# Patient Record
Sex: Female | Born: 1950 | Race: White | Hispanic: No | Smoking: Former smoker
Health system: Southern US, Community
[De-identification: ages and names within clinical notes are randomized; demographics above are authoritative.]

## PROBLEM LIST (undated history)

## (undated) ENCOUNTER — Ambulatory Visit: Admission: EM | Source: Home / Self Care

## (undated) DIAGNOSIS — B019 Varicella without complication: Secondary | ICD-10-CM

## (undated) DIAGNOSIS — N179 Acute kidney failure, unspecified: Secondary | ICD-10-CM

## (undated) DIAGNOSIS — E119 Type 2 diabetes mellitus without complications: Secondary | ICD-10-CM

## (undated) DIAGNOSIS — Z803 Family history of malignant neoplasm of breast: Secondary | ICD-10-CM

## (undated) DIAGNOSIS — Z801 Family history of malignant neoplasm of trachea, bronchus and lung: Secondary | ICD-10-CM

## (undated) DIAGNOSIS — F329 Major depressive disorder, single episode, unspecified: Secondary | ICD-10-CM

## (undated) DIAGNOSIS — Z800 Family history of malignant neoplasm of digestive organs: Secondary | ICD-10-CM

## (undated) DIAGNOSIS — Z87898 Personal history of other specified conditions: Secondary | ICD-10-CM

## (undated) DIAGNOSIS — T7840XA Allergy, unspecified, initial encounter: Secondary | ICD-10-CM

## (undated) DIAGNOSIS — R7303 Prediabetes: Secondary | ICD-10-CM

## (undated) DIAGNOSIS — N390 Urinary tract infection, site not specified: Secondary | ICD-10-CM

## (undated) DIAGNOSIS — F32A Depression, unspecified: Secondary | ICD-10-CM

## (undated) DIAGNOSIS — N3010 Interstitial cystitis (chronic) without hematuria: Secondary | ICD-10-CM

## (undated) DIAGNOSIS — M4800 Spinal stenosis, site unspecified: Secondary | ICD-10-CM

## (undated) DIAGNOSIS — I10 Essential (primary) hypertension: Secondary | ICD-10-CM

## (undated) HISTORY — DX: Urinary tract infection, site not specified: N39.0

## (undated) HISTORY — DX: Personal history of other specified conditions: Z87.898

## (undated) HISTORY — DX: Family history of malignant neoplasm of digestive organs: Z80.0

## (undated) HISTORY — DX: Family history of malignant neoplasm of trachea, bronchus and lung: Z80.1

## (undated) HISTORY — DX: Spinal stenosis, site unspecified: M48.00

## (undated) HISTORY — DX: Allergy, unspecified, initial encounter: T78.40XA

## (undated) HISTORY — DX: Varicella without complication: B01.9

## (undated) HISTORY — DX: Major depressive disorder, single episode, unspecified: F32.9

## (undated) HISTORY — DX: Depression, unspecified: F32.A

## (undated) HISTORY — DX: Acute kidney failure, unspecified: N17.9

## (undated) HISTORY — PX: CATARACT EXTRACTION: SUR2

## (undated) HISTORY — DX: Family history of malignant neoplasm of breast: Z80.3

## (undated) HISTORY — PX: OTHER SURGICAL HISTORY: SHX169

## (undated) HISTORY — DX: Prediabetes: R73.03

## (undated) HISTORY — DX: Interstitial cystitis (chronic) without hematuria: N30.10

## (undated) HISTORY — DX: Essential (primary) hypertension: I10

---

## 2012-03-27 ENCOUNTER — Ambulatory Visit: Payer: Self-pay | Admitting: Family Medicine

## 2012-03-27 IMAGING — CR DG CHEST 2V
1 series · 2 of 2 positions shown · non-contrast
Comparison: none

REASON FOR EXAM: cough and dyspnea
COMMENTS:

[Series 1: w chest pa · 0.14mm/px · 2 of 2 slices shown]
[im 1/2]
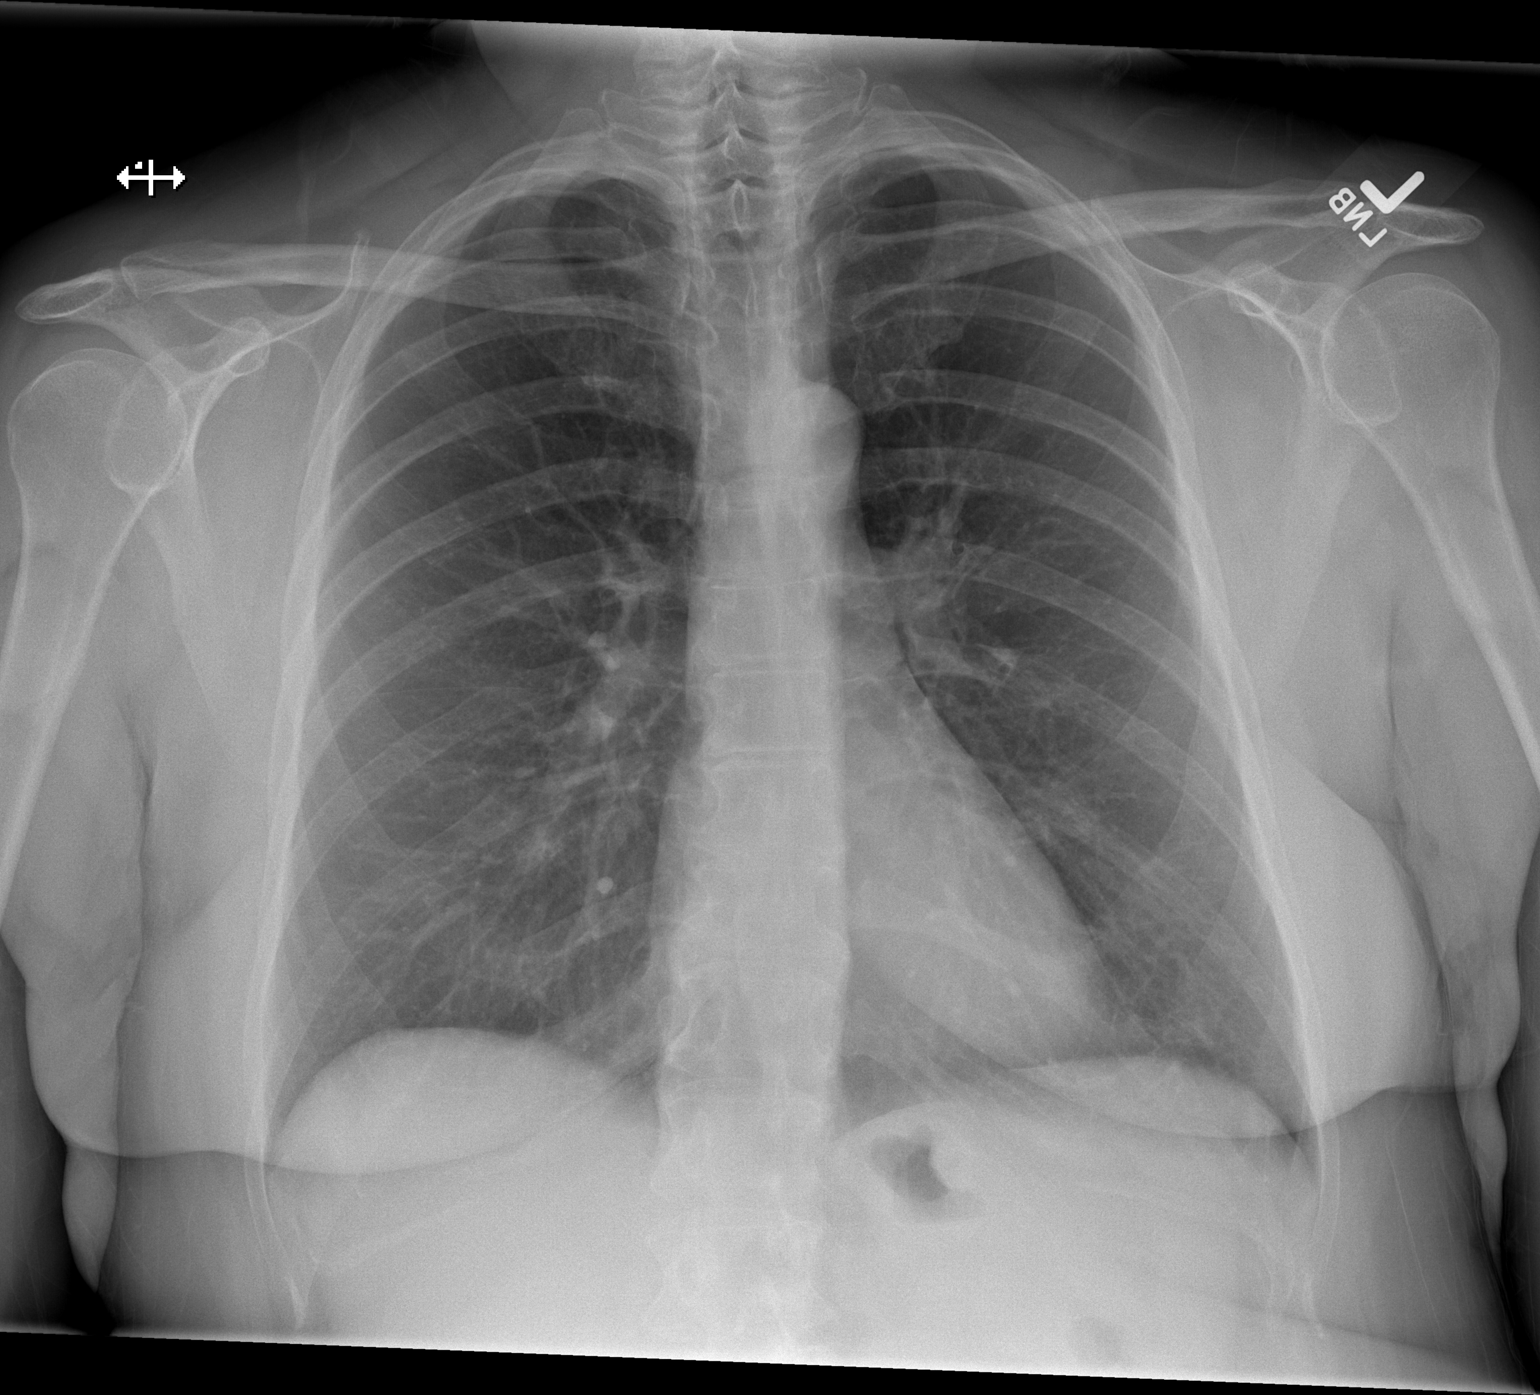
[im 2/2]
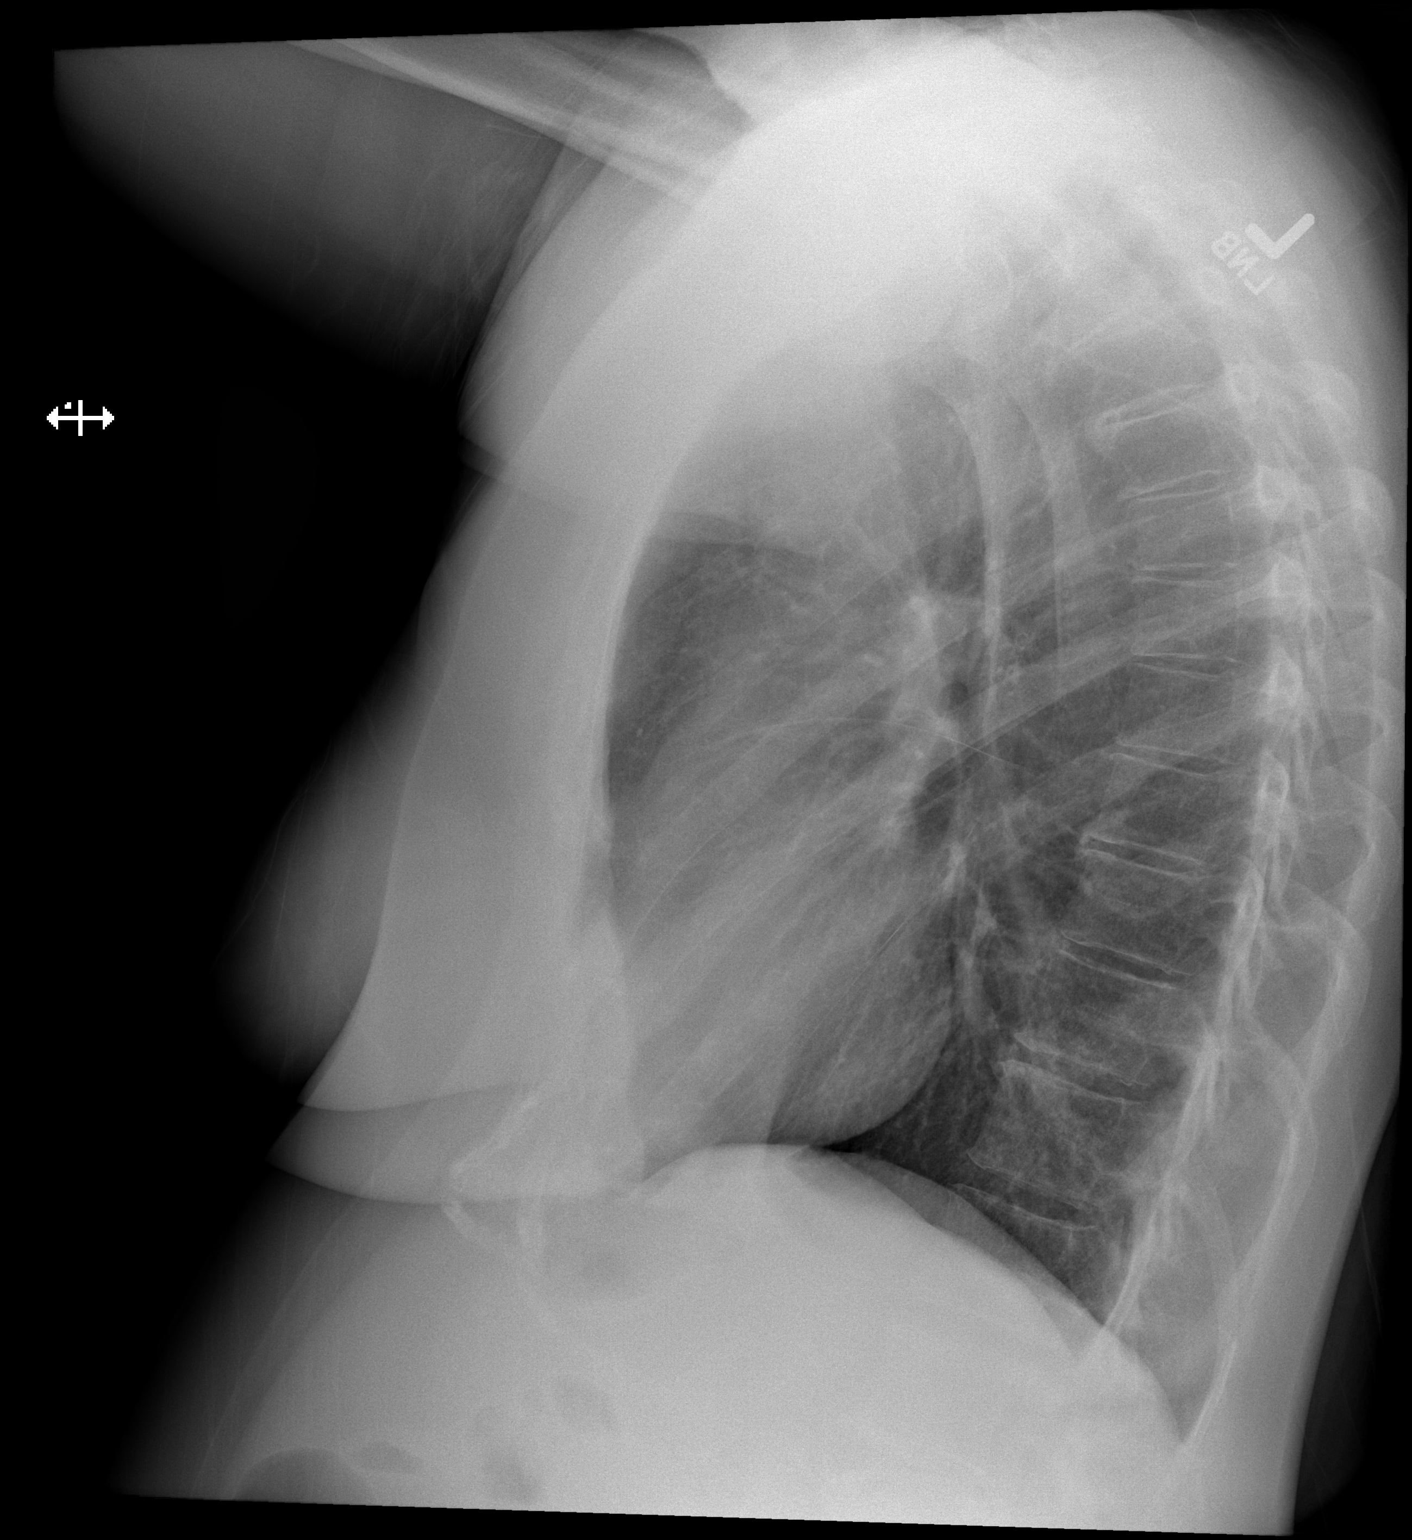

[2 of 2 positions shown; findings below may reference images not displayed]

PROCEDURE:     DXR - DXR CHEST PA (OR AP) AND LATERAL  - [DATE] [DATE]

RESULT:     The lungs are clear. The heart and pulmonary vessels are normal.
The bony and mediastinal structures are unremarkable. There is no effusion.
There is no pneumothorax or evidence of congestive failure. There is mild
hyperinflation suggestive of COPD. Correlate clinically.
IMPRESSION: No acute cardiopulmonary disease. Hyperinflation present.
Correlate for COPD.

[REDACTED]

## 2013-12-17 ENCOUNTER — Ambulatory Visit: Payer: Self-pay | Admitting: Nurse Practitioner

## 2013-12-17 IMAGING — MG MM DIGITAL SCREENING BILAT W/ TOMO W/ CAD
8 of 14 series · 8 of 30 positions shown · non-contrast
Comparison: None.

CLINICAL DATA: Screening.

EXAM:
DIGITAL SCREENING BILATERAL MAMMOGRAM WITH 3D TOMO WITH CAD

[R MLO (1 of 2)]
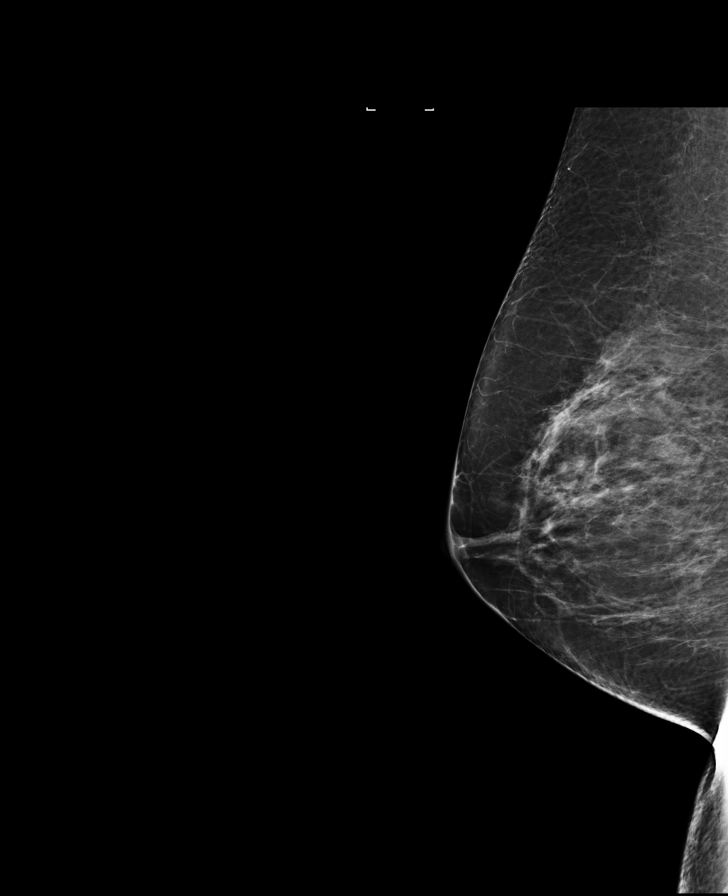

[L CC (1 of 2)]
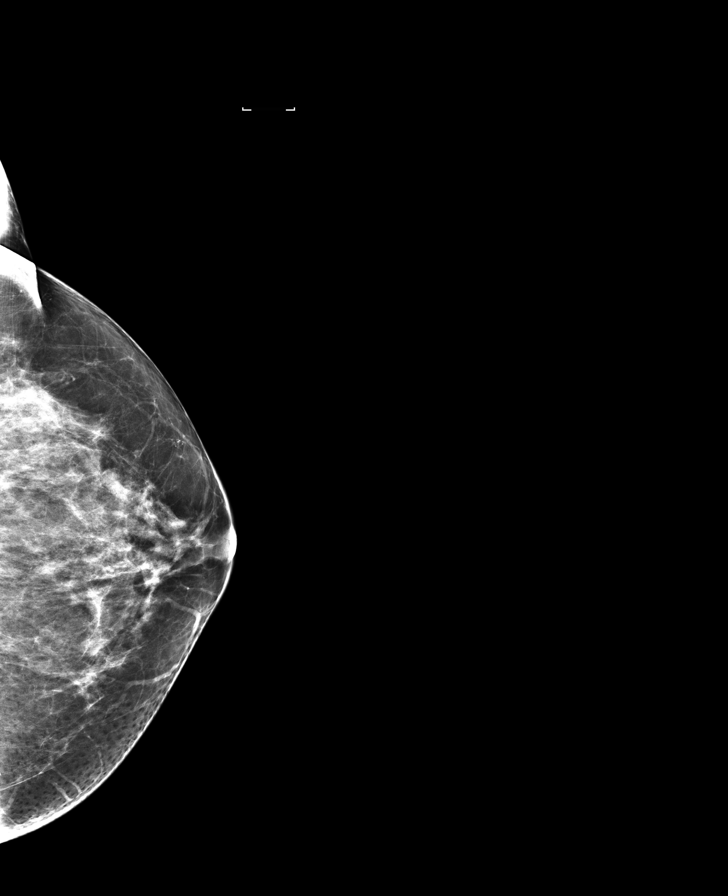

[L CC synth-2D]
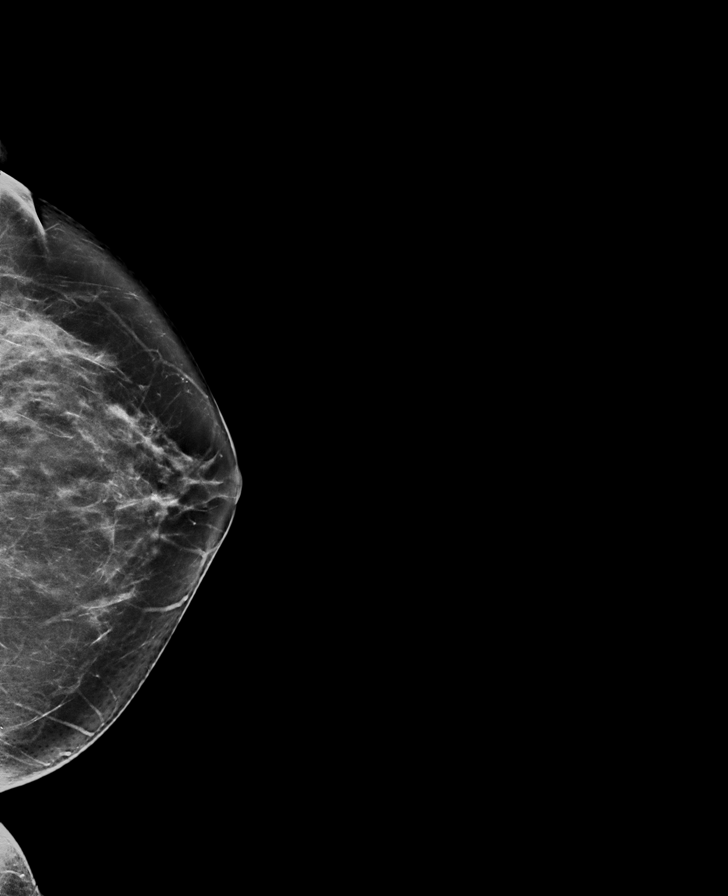

[L CC (2 of 2)]
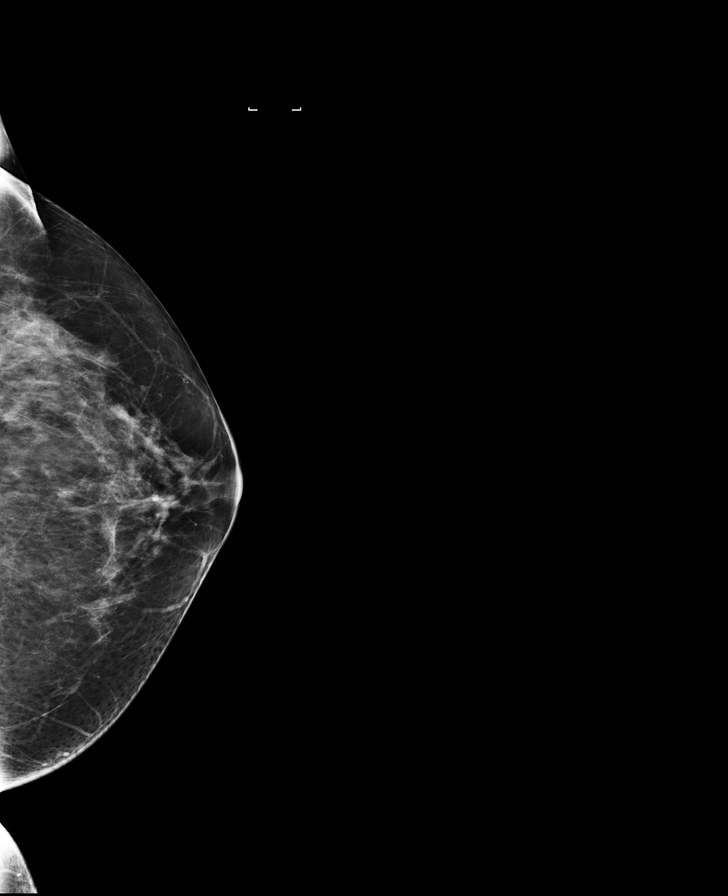

[R CC synth-2D]
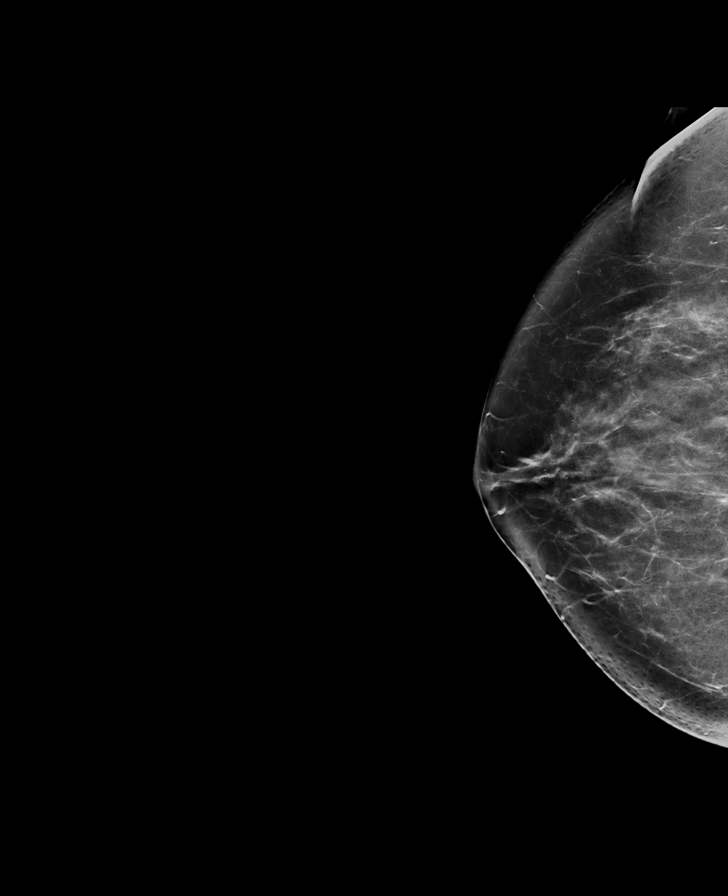

[R MLO synth-2D]
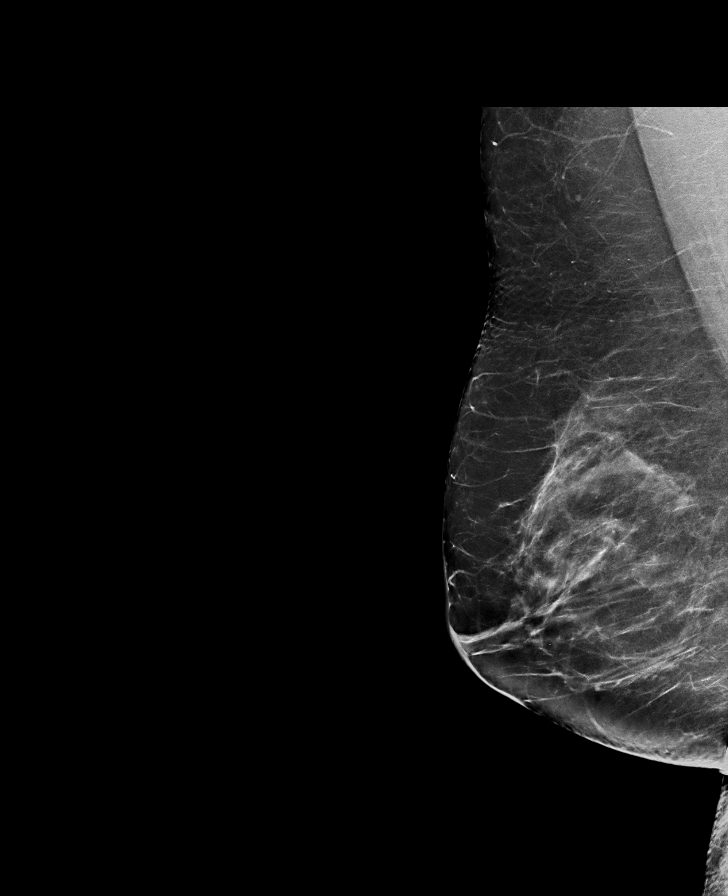

[R CC]
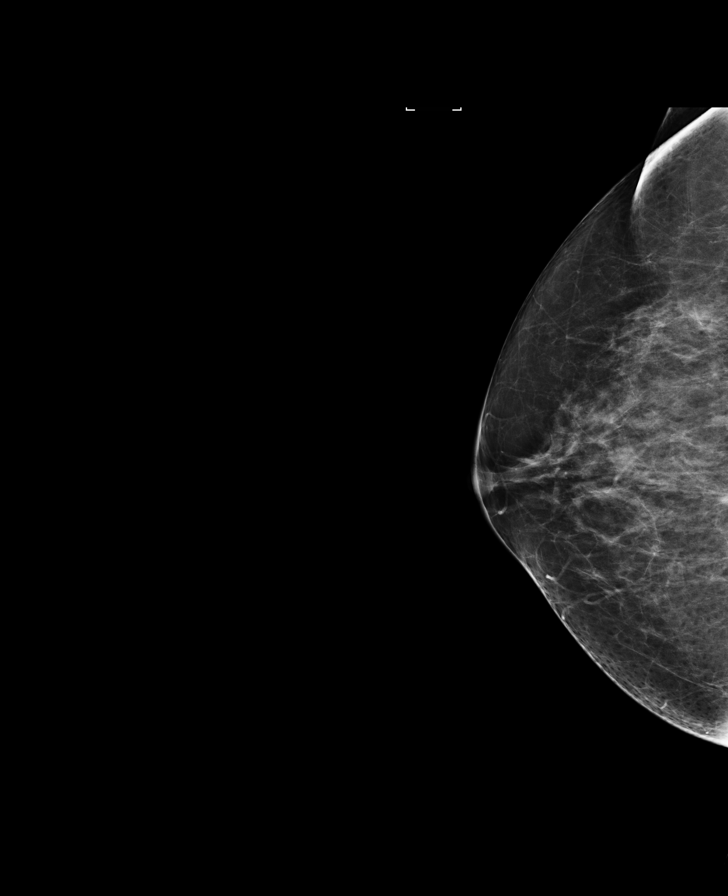

[R MLO (2 of 2)]
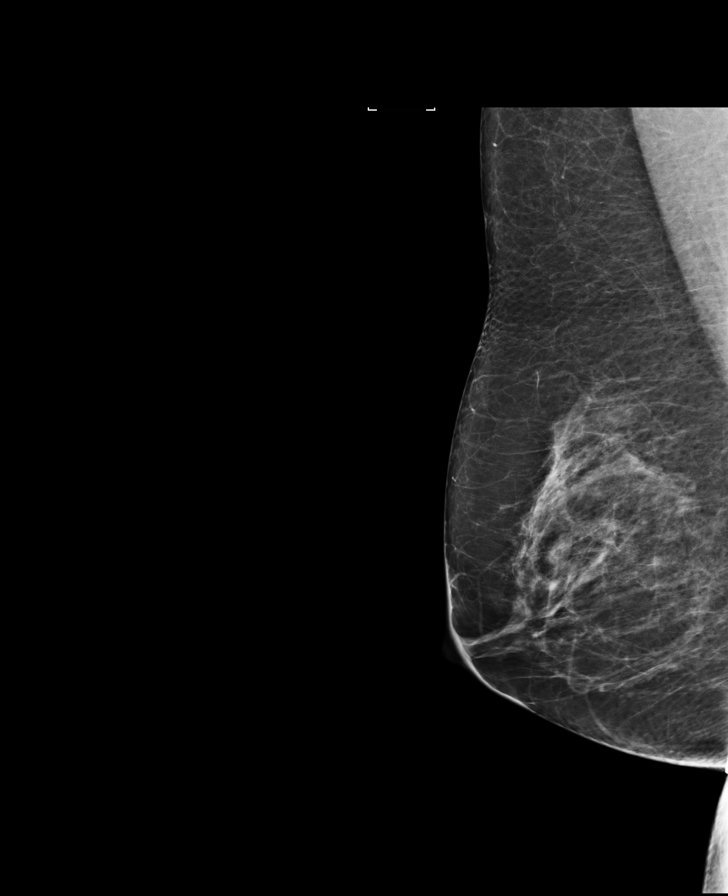

[8 of 30 positions shown; findings below may reference images not displayed]

ACR Breast Density Category b: There are scattered areas of
fibroglandular density.
FINDINGS: There are no findings suspicious for malignancy. Images were
processed with CAD.
IMPRESSION: No mammographic evidence of malignancy. A result letter of this
screening mammogram will be mailed directly to the patient.

RECOMMENDATION:
Screening mammogram in one year. (Code:[N7])

BI-RADS CATEGORY  1: Negative.

## 2015-02-20 DIAGNOSIS — M47812 Spondylosis without myelopathy or radiculopathy, cervical region: Secondary | ICD-10-CM | POA: Insufficient documentation

## 2015-05-20 ENCOUNTER — Other Ambulatory Visit: Payer: Self-pay | Admitting: Internal Medicine

## 2015-05-20 DIAGNOSIS — Z1239 Encounter for other screening for malignant neoplasm of breast: Secondary | ICD-10-CM

## 2015-05-20 DIAGNOSIS — M150 Primary generalized (osteo)arthritis: Secondary | ICD-10-CM | POA: Insufficient documentation

## 2015-06-08 ENCOUNTER — Other Ambulatory Visit: Payer: Self-pay | Admitting: Internal Medicine

## 2015-06-08 ENCOUNTER — Ambulatory Visit
Admission: RE | Admit: 2015-06-08 | Discharge: 2015-06-08 | Disposition: A | Discharge status: Home / Self Care | Payer: Medicare Other | Source: Ambulatory Visit | Attending: Internal Medicine | Admitting: Internal Medicine

## 2015-06-08 DIAGNOSIS — Z1239 Encounter for other screening for malignant neoplasm of breast: Secondary | ICD-10-CM

## 2015-06-08 DIAGNOSIS — Z1231 Encounter for screening mammogram for malignant neoplasm of breast: Secondary | ICD-10-CM | POA: Insufficient documentation

## 2015-06-08 IMAGING — MG MM DIGITAL SCREENING BILAT W/ TOMO W/ CAD
8 of 12 series · 8 of 28 positions shown · non-contrast
Comparison: Previous exam(s).

CLINICAL DATA: Screening.

EXAM:
2D DIGITAL SCREENING BILATERAL MAMMOGRAM WITH CAD AND ADJUNCT TOMO

[L MLO synth-2D]
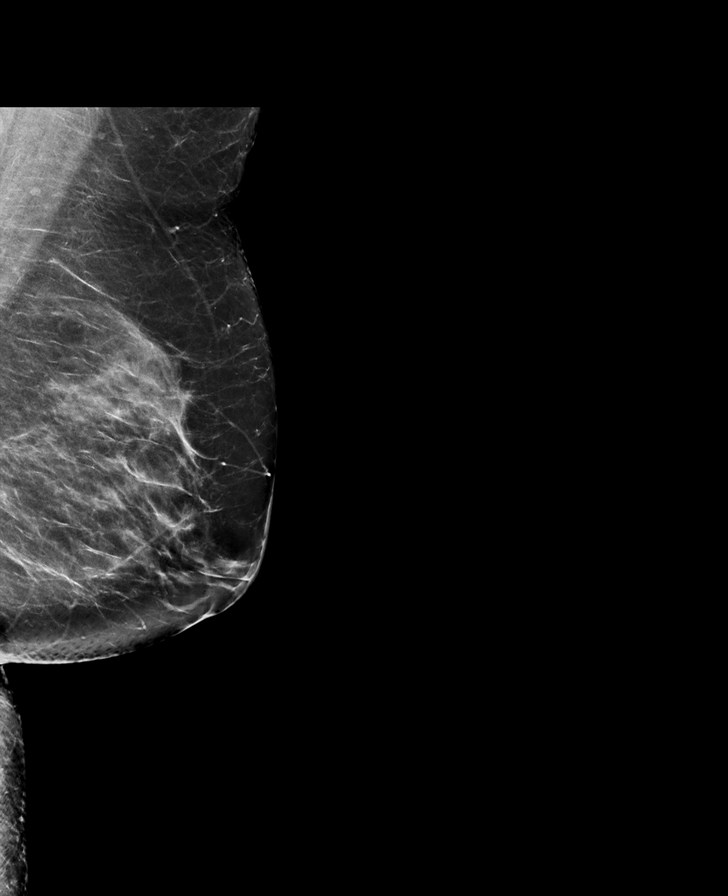

[R CC]
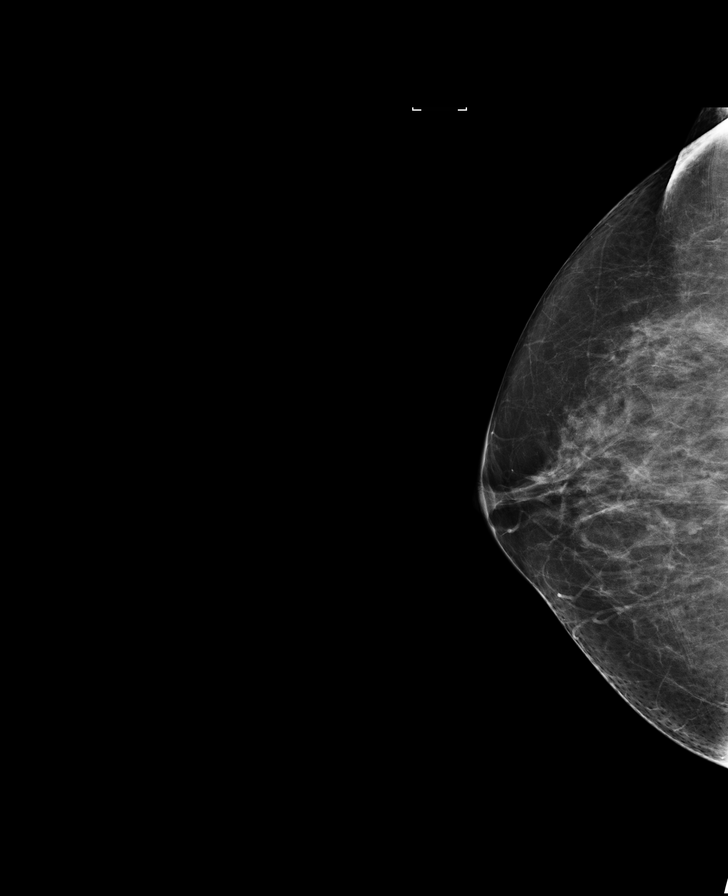

[L MLO]
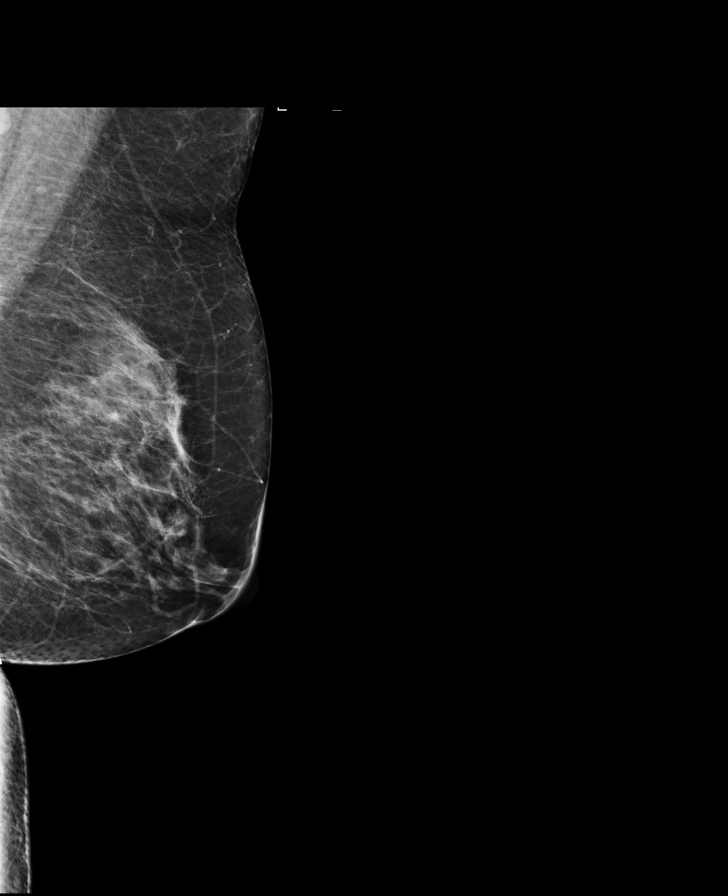

[L CC]
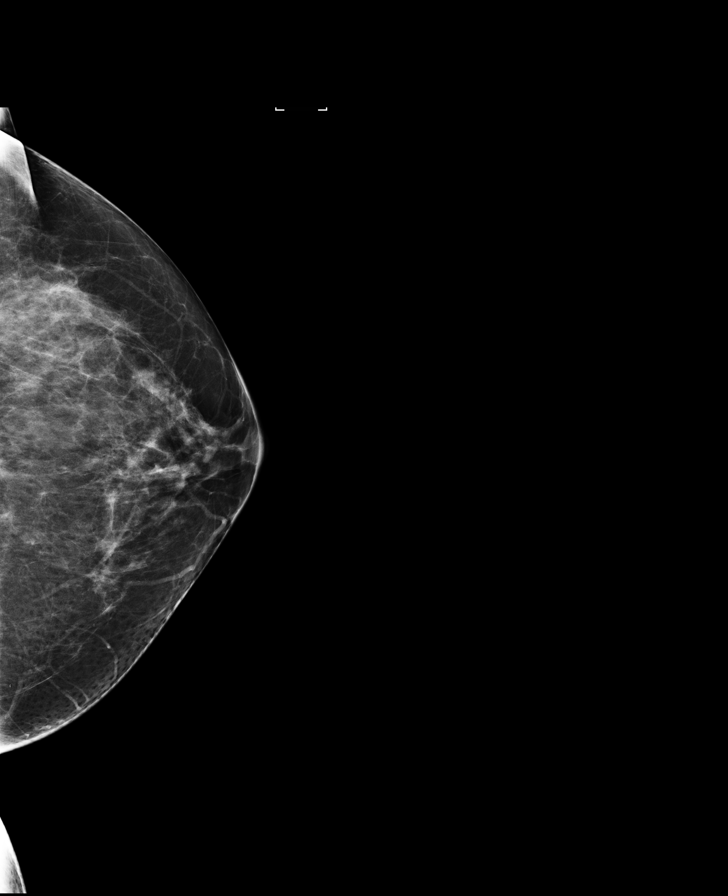

[R CC synth-2D]
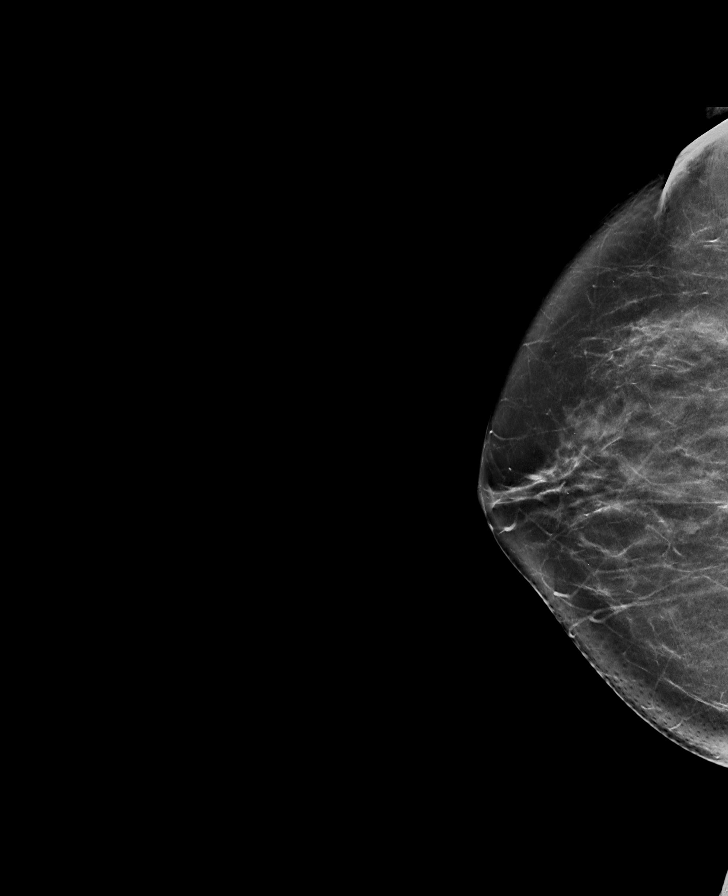

[R MLO synth-2D]
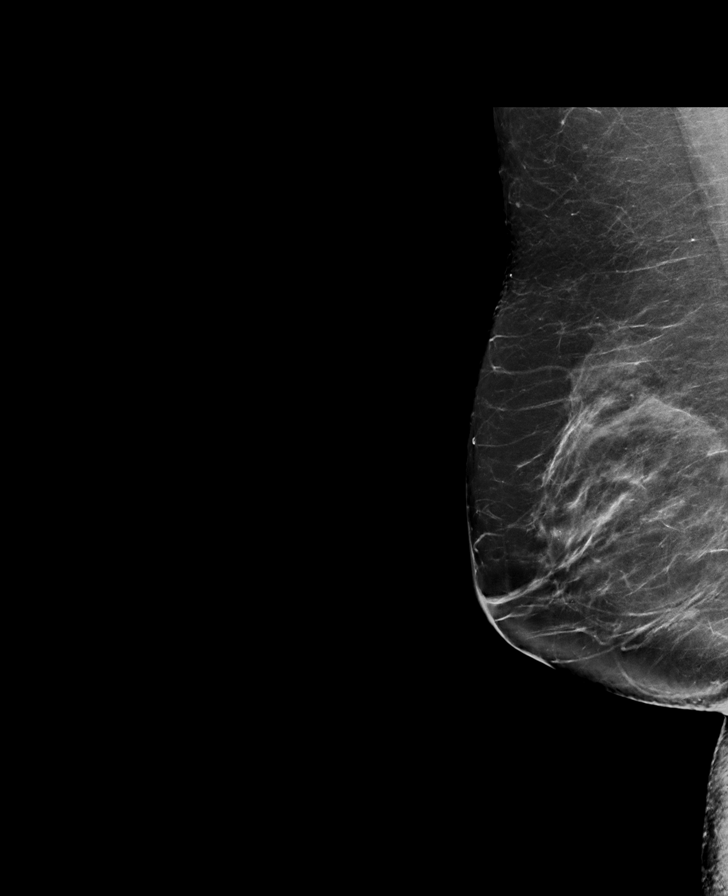

[R MLO]
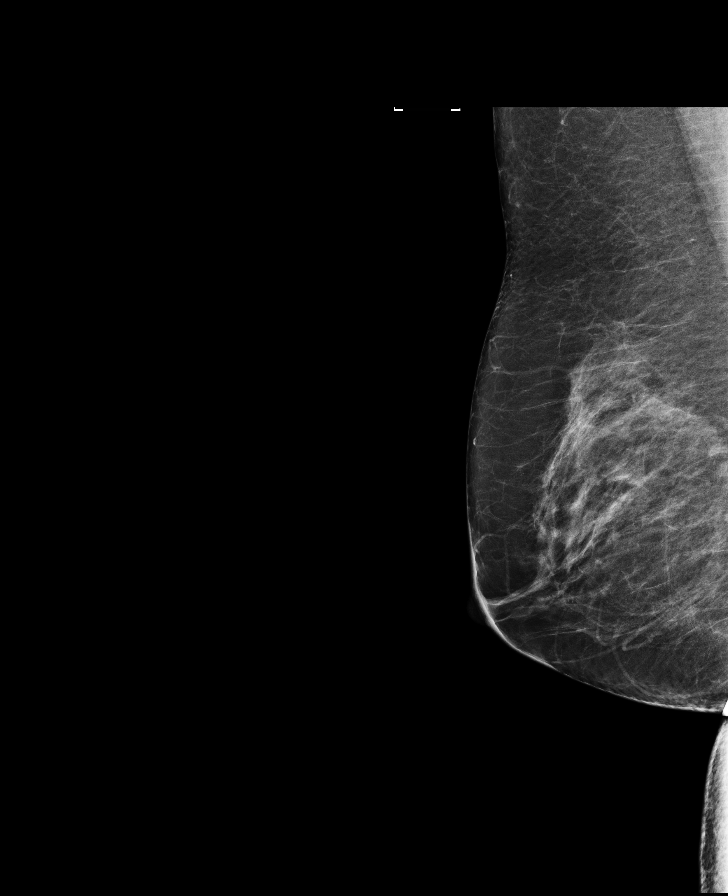

[L CC synth-2D]
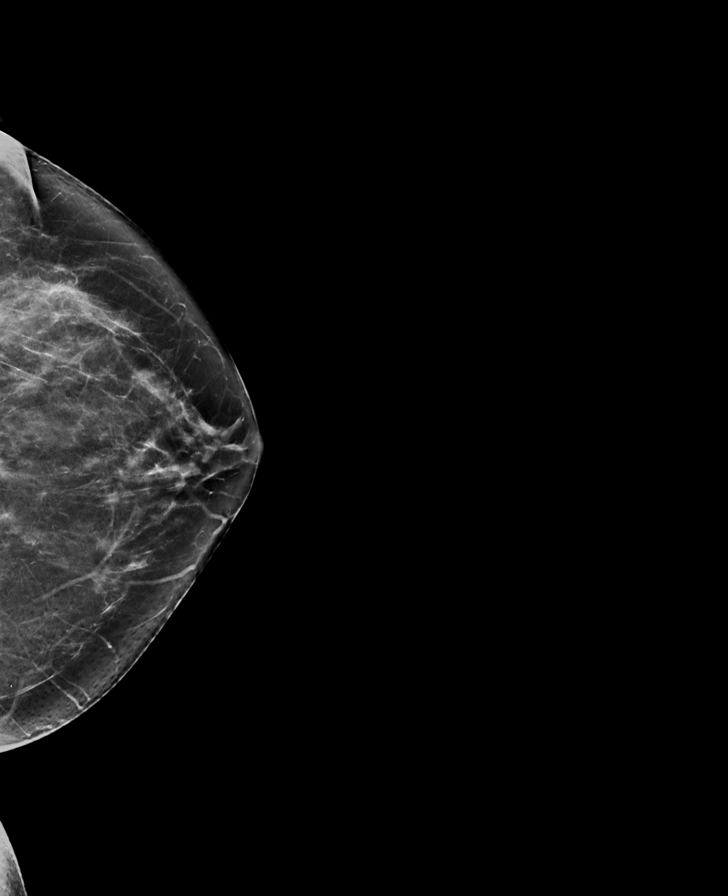

[8 of 28 positions shown; findings below may reference images not displayed]

ACR Breast Density Category c: The breast tissue is heterogeneously
dense, which may obscure small masses.
FINDINGS: There are no findings suspicious for malignancy. Images were
processed with CAD.
IMPRESSION: No mammographic evidence of malignancy. A result letter of this
screening mammogram will be mailed directly to the patient.

RECOMMENDATION:
Screening mammogram in one year. (Code:[TA])

BI-RADS CATEGORY  1: Negative.

## 2016-06-20 ENCOUNTER — Other Ambulatory Visit: Payer: Self-pay | Admitting: Internal Medicine

## 2016-06-20 DIAGNOSIS — Z1231 Encounter for screening mammogram for malignant neoplasm of breast: Secondary | ICD-10-CM

## 2016-07-04 ENCOUNTER — Ambulatory Visit
Admission: RE | Admit: 2016-07-04 | Discharge: 2016-07-04 | Disposition: A | Discharge status: Home / Self Care | Payer: Medicare Other | Source: Ambulatory Visit | Attending: Internal Medicine | Admitting: Internal Medicine

## 2016-07-04 DIAGNOSIS — Z1231 Encounter for screening mammogram for malignant neoplasm of breast: Secondary | ICD-10-CM | POA: Diagnosis present

## 2016-07-04 IMAGING — MG MM DIGITAL SCREENING BILAT W/ TOMO W/ CAD
8 of 14 series · 8 of 30 positions shown · non-contrast
Comparison: Previous exam(s).

CLINICAL DATA: Screening.

EXAM:
2D DIGITAL SCREENING BILATERAL MAMMOGRAM WITH CAD AND ADJUNCT TOMO

[R MLO (1 of 2)]
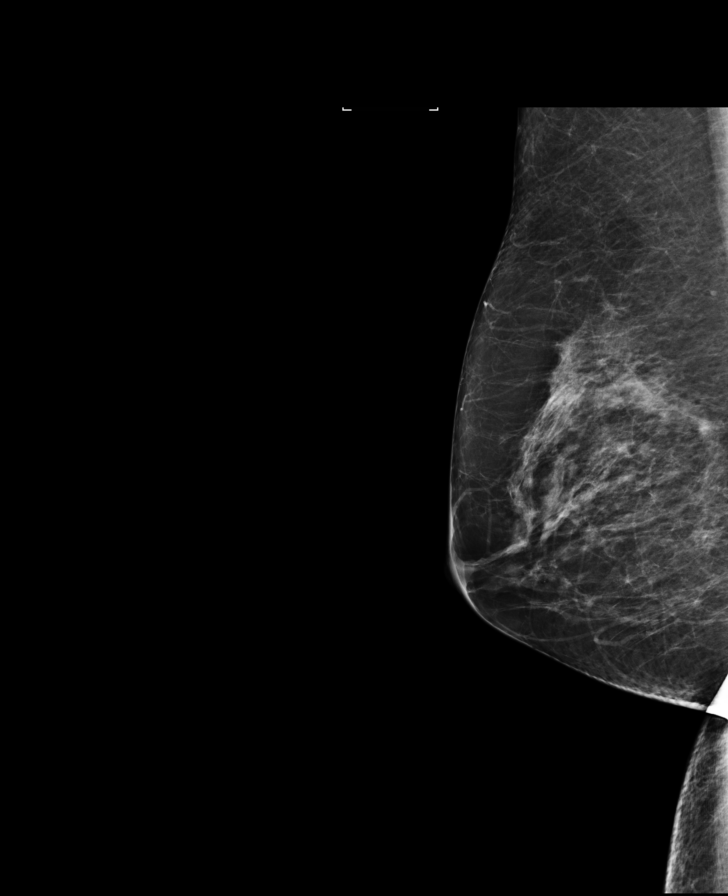

[L MLO (1 of 2)]
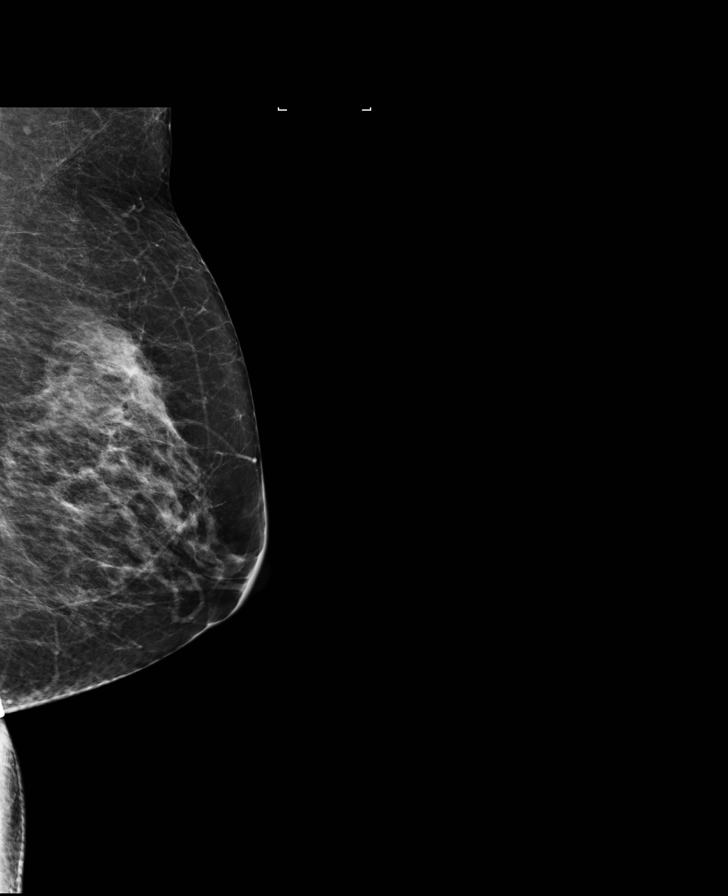

[R MLO synth-2D]
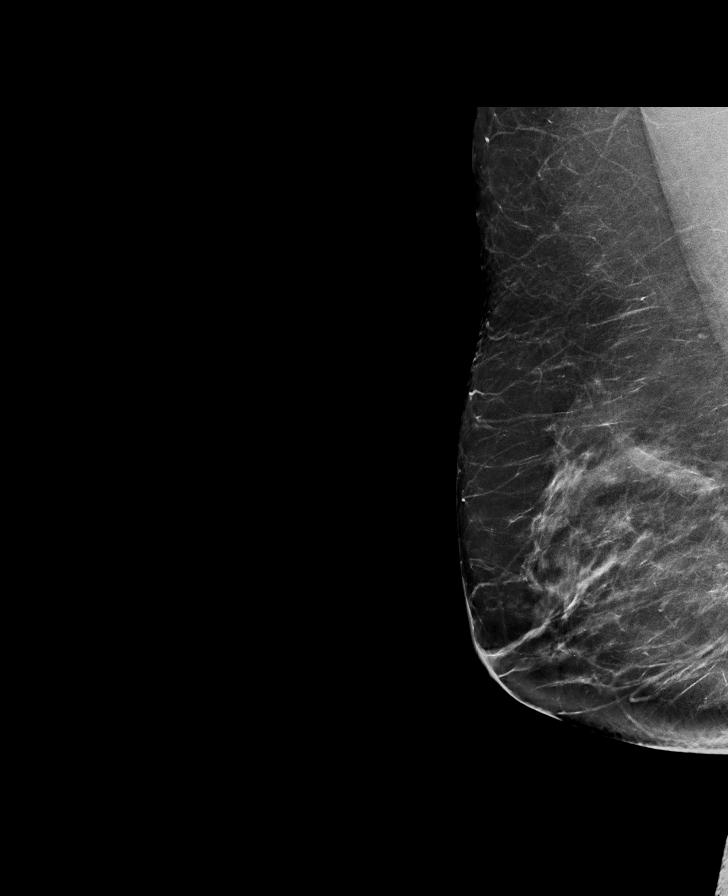

[L CC]
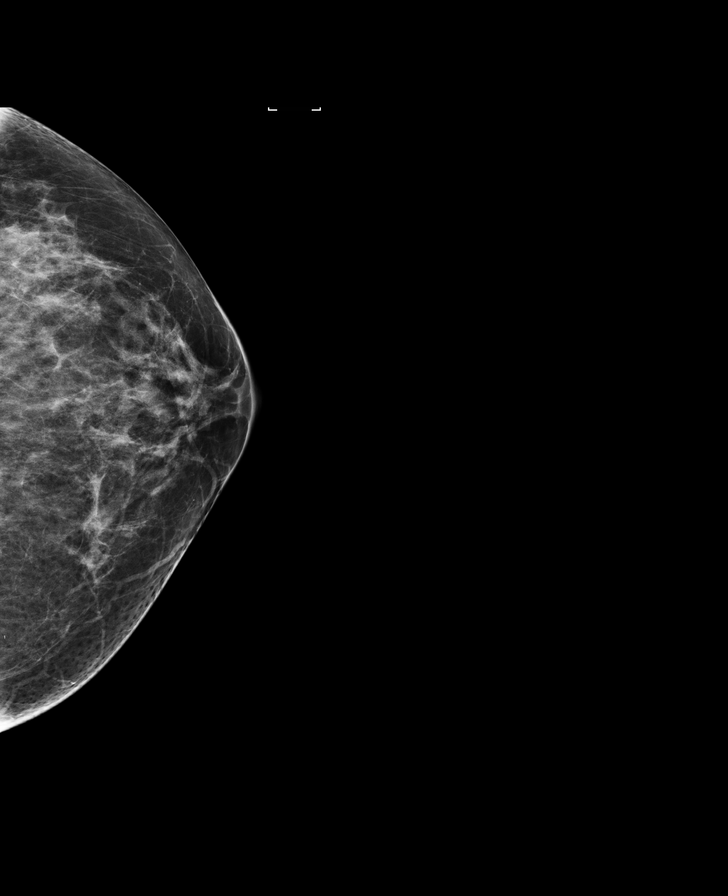

[R MLO (2 of 2)]
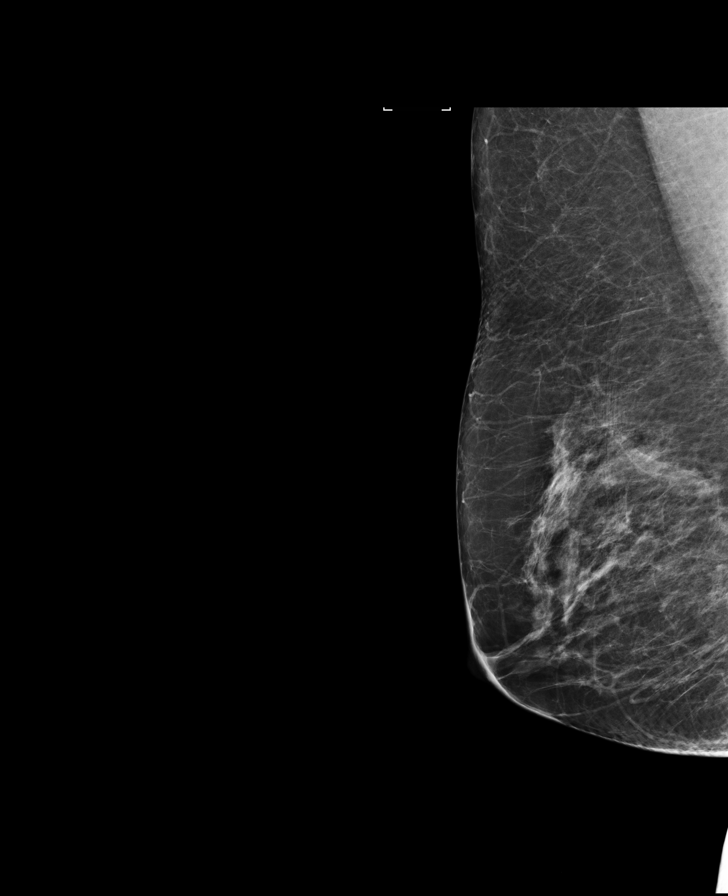

[L MLO synth-2D]
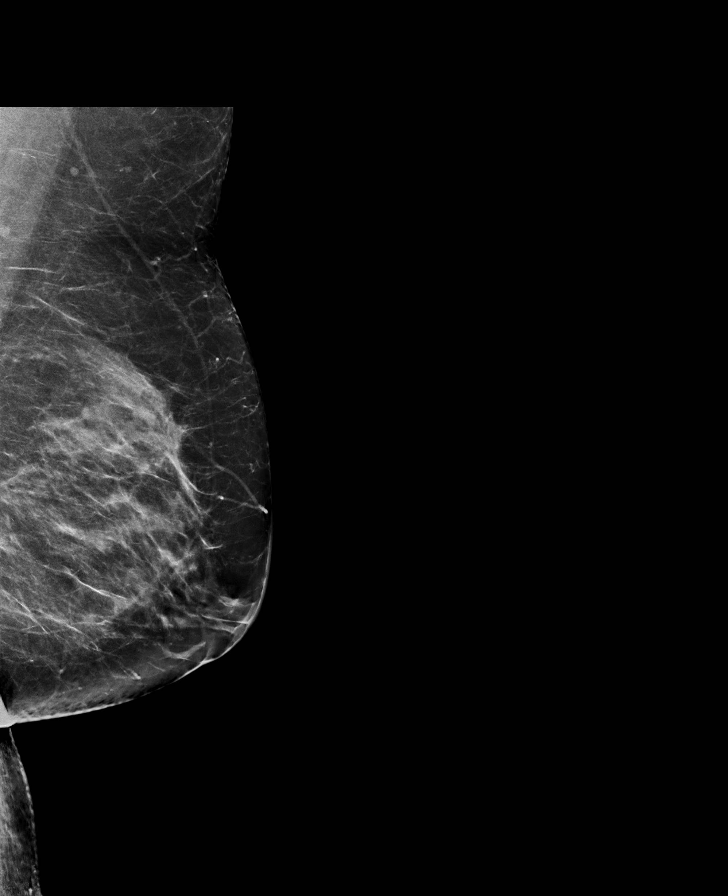

[R CC]
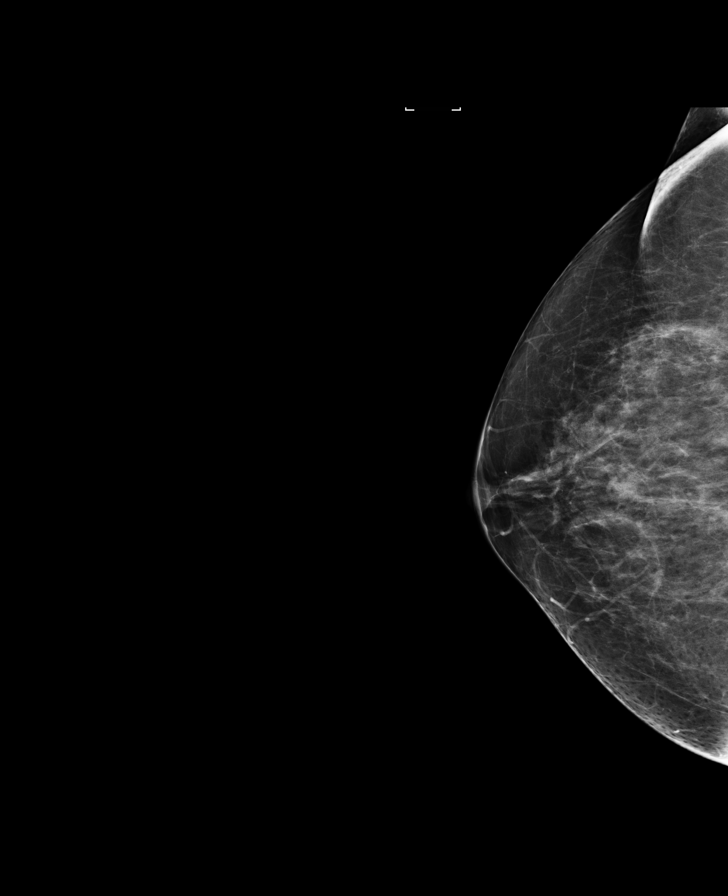

[L MLO (2 of 2)]
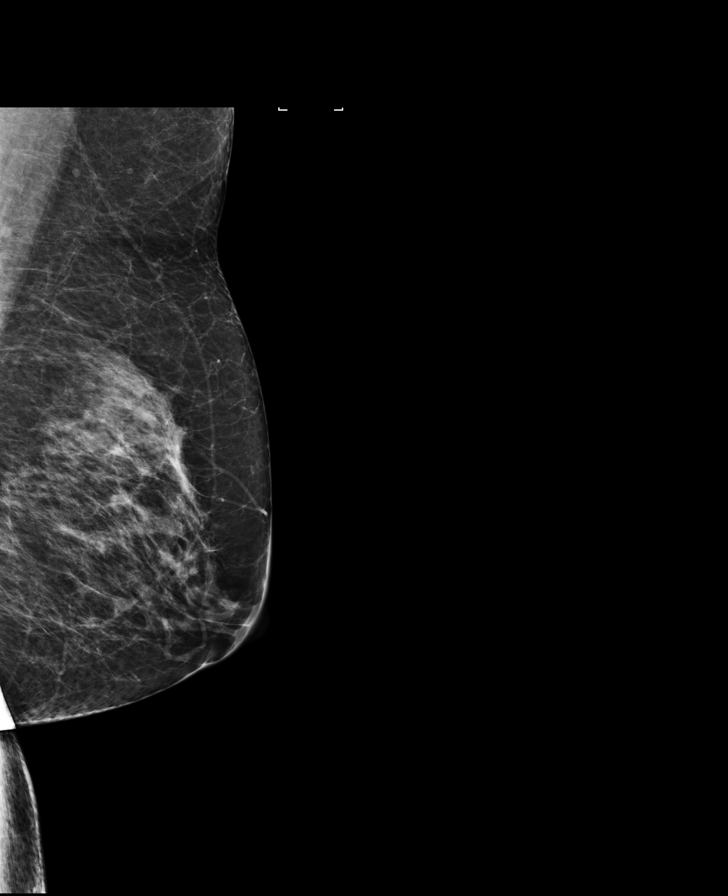

[8 of 30 positions shown; findings below may reference images not displayed]

ACR Breast Density Category b: There are scattered areas of
fibroglandular density.
FINDINGS: There are no findings suspicious for malignancy. Images were
processed with CAD.
IMPRESSION: No mammographic evidence of malignancy. A result letter of this
screening mammogram will be mailed directly to the patient.

RECOMMENDATION:
Screening mammogram in one year. (Code:[33])

BI-RADS CATEGORY  1: Negative.

## 2016-12-15 ENCOUNTER — Encounter: Payer: Self-pay | Admitting: Internal Medicine

## 2016-12-15 ENCOUNTER — Ambulatory Visit: Payer: Medicare Other | Admitting: Internal Medicine

## 2016-12-15 VITALS — BP 130/84 | HR 92 | Temp 97.8°F | Ht 64.0 in | Wt 189.1 lb

## 2016-12-15 DIAGNOSIS — N3000 Acute cystitis without hematuria: Secondary | ICD-10-CM

## 2016-12-15 DIAGNOSIS — Z1159 Encounter for screening for other viral diseases: Secondary | ICD-10-CM

## 2016-12-15 DIAGNOSIS — L253 Unspecified contact dermatitis due to other chemical products: Secondary | ICD-10-CM | POA: Diagnosis not present

## 2016-12-15 DIAGNOSIS — R7303 Prediabetes: Secondary | ICD-10-CM

## 2016-12-15 DIAGNOSIS — F419 Anxiety disorder, unspecified: Secondary | ICD-10-CM | POA: Diagnosis not present

## 2016-12-15 DIAGNOSIS — G4700 Insomnia, unspecified: Secondary | ICD-10-CM | POA: Diagnosis not present

## 2016-12-15 DIAGNOSIS — I10 Essential (primary) hypertension: Secondary | ICD-10-CM | POA: Diagnosis not present

## 2016-12-15 DIAGNOSIS — N3010 Interstitial cystitis (chronic) without hematuria: Secondary | ICD-10-CM | POA: Diagnosis not present

## 2016-12-15 DIAGNOSIS — L259 Unspecified contact dermatitis, unspecified cause: Secondary | ICD-10-CM | POA: Insufficient documentation

## 2016-12-15 DIAGNOSIS — F320 Major depressive disorder, single episode, mild: Secondary | ICD-10-CM | POA: Diagnosis not present

## 2016-12-15 DIAGNOSIS — Z13818 Encounter for screening for other digestive system disorders: Secondary | ICD-10-CM

## 2016-12-15 MED ORDER — NITROFURANTOIN MONOHYD MACRO 100 MG PO CAPS: 100.0000 mg | ORAL_CAPSULE | Freq: Every day | ORAL | 1 refills | Status: DC

## 2016-12-15 MED ORDER — TRAZODONE HCL 50 MG PO TABS
50.0000 mg | ORAL_TABLET | Freq: Every evening | ORAL | 1 refills | Status: DC | PRN
Start: 2016-12-15 — End: 2017-06-14

## 2016-12-15 MED ORDER — LISINOPRIL-HYDROCHLOROTHIAZIDE 20-12.5 MG PO TABS: 1.0000 | ORAL_TABLET | Freq: Every day | ORAL | 3 refills | Status: DC

## 2016-12-15 NOTE — Progress Notes (Addendum)
Chief Complaint  Patient presents with  . Establish Care   Establish PCP she was going to [facility] (Dr. Mickel Duhamel) but wants to change  1. Interstitial cystitis with h/o UTIs( had 4 since 01/2016 to 10/2016), c/o increased frequency and nocturia. She feels overall IC is controlled she tries to control with diet but does have increased urinary frequency and nocturia  2. Mood and anxiety vary with status of husbands health PHQ 9 today 5  3. HTN controlled needs Rx refill    Review of Systems  Respiratory: Negative for shortness of breath.   Cardiovascular: Negative for chest pain.  Genitourinary: Positive for frequency.       +h/o interstitial cystitis with freq UTI and urinary freq and nocturia   Musculoskeletal: Positive for back pain.       +spinal stenosis   Psychiatric/Behavioral: Positive for depression. The patient is nervous/anxious.        +mood due to husband being dx'ed stage 4 prostate cancer 2017    Past Medical History:  Diagnosis Date  . Allergy    i.e contact allergies [place] [facility]   . Family history of breast cancer    BRCA negative in the past  . Hypertension   . Interstitial cystitis   . Prediabetes    A1C 6.3 06/14/16  . Spinal stenosis   . UTI (urinary tract infection)    Past Surgical History:  Procedure Laterality Date  . denies     denies psuH   Family History  Problem Relation Age of Onset  . Breast cancer Mother 61       s/p b/l masectomy   . Breast cancer Sister 60  . Breast cancer Maternal Aunt   . Breast cancer Maternal Grandmother    Social History   Socioeconomic History  . Marital status: Married    Spouse name: Not on file  . Number of children: Not on file  . Years of education: Not on file  . Highest education level: Not on file  Social Needs  . Financial resource strain: Not on file  . Food insecurity - worry: Not on file  . Food insecurity - inability: Not on file  . Transportation needs - medical: Not on file   . Transportation needs - non-medical: Not on file  Occupational History  . Not on file  Tobacco Use  . Smoking status: Former Research scientist (life sciences)  . Smokeless tobacco: Never Used  . Tobacco comment: smoked 4-5 years in college then quit  Substance and Sexual Activity  . Alcohol use: No    Frequency: Never  . Drug use: No  . Sexual activity: Not on file  Other Topics Concern  . Not on file  Social History Narrative   1 daughter and 1 son    3 grandsons    Married to high school sweet heart dx'ed with prostate cancer in 2017    Current Meds  Medication Sig  . cetirizine (ZYRTEC) 10 MG tablet Take by mouth.  . docusate sodium (COLACE) 100 MG capsule Take 100 mg by mouth daily.  Marland Kitchen FLUZONE HIGH-DOSE 0.5 ML injection TO BE ADMINISTERED BY PHARMACIST FOR IMMUNIZATION  . lisinopril-hydrochlorothiazide (PRINZIDE,ZESTORETIC) 20-12.5 MG tablet Take 1 tablet by mouth daily.  . meloxicam (MOBIC) 7.5 MG tablet TAKE 1-2 TABLETS DAILY AS NEEDED  . traZODone (DESYREL) 50 MG tablet Take 1 tablet (50 mg total) by mouth at bedtime as needed for sleep.  . [DISCONTINUED] lisinopril-hydrochlorothiazide (PRINZIDE,ZESTORETIC) 20-12.5 MG tablet Take 1  tablet by mouth daily.  . [DISCONTINUED] traZODone (DESYREL) 50 MG tablet Take by mouth.   Allergies  Allergen Reactions  . Benzalkonium Chloride Rash  . Cetyl Alcohol Rash  . Codeine Rash  . Lanolin Rash  . Neomycin Rash  . Nickel Rash  . Propylene Glycol Rash   No results found for this or any previous visit (from the past 2160 hour(s)). Objective  Body mass index is 32.46 kg/m. Wt Readings from Last 3 Encounters:  12/15/16 189 lb 2 oz (85.8 kg)   Temp Readings from Last 3 Encounters:  12/15/16 97.8 F (36.6 C) (Oral)   BP Readings from Last 3 Encounters:  12/15/16 130/84   Pulse Readings from Last 3 Encounters:  12/15/16 92   Pulse oximetry on room air is 95% Physical Exam  Constitutional: She is oriented to person, place, and time and  well-developed, well-nourished, and in no distress. Vital signs are normal.  HENT:  Head: Normocephalic and atraumatic.  Mouth/Throat: Oropharynx is clear and moist and mucous membranes are normal.  Eyes: Conjunctivae are normal. Pupils are equal, round, and reactive to light.  Cardiovascular: Normal rate, regular rhythm and normal heart sounds.  Pulmonary/Chest: Effort normal and breath sounds normal.  Abdominal: Soft. Bowel sounds are normal.  Neurological: She is alert and oriented to person, place, and time.  Skin: Skin is warm and dry.  Contact dermatitis to hands  Psychiatric: Mood, memory, affect and judgment normal.  Nursing note and vitals reviewed.  Assessment   1. Insterstitial cystitis with frequent UTIs 2.HTN controlled  3. Depression/anxiety PHQ 9 5 today/insomnia  Related to husbands health status  4. HM 5. Contact dermatitis/allergies   Plan   1. Given info to read  tx macrobid 100 mg qhs to see if helps  2. Refilled med  3. Monitor  Rx trazodone 50 mg prn  Given mediatation apps to try Insight timer, calm and headspace.  4. ? If had flu shot   Tdap had 07/22/15 pna 23 11/20/14   TSH nl 06/14/16 3.464  Last pap 07/22/15 negative HPV neg  Mammogram 07/04/16 negative  disc'ed colonoscopy pt wants to do cologaurd will send Rx   Pt will come back 12/6 for labs   Consider my myriad testing for future strong FH breast cancer she wants to wait for now.   5. F/u [place] [facility] [address]    Of note Dr. Marylee Floras (ortho) for hip pain   Provider: Dr. Olivia Mackie McLean-Scocuzza

## 2016-12-15 NOTE — Patient Instructions (Addendum)
Please schedule labs 12/22/16 fasting x 12 hours  Try Macrobid daily to prevent UTI  Try Meditation (Calm, Headspace, Insight Timer) Apps  Think about [facility] testing  Please do cologuard  F/u in 6 months sooner if needed (I.e UTI symptoms)   Insomnia Insomnia is a sleep disorder that makes it difficult to fall asleep or to stay asleep. Insomnia can cause tiredness (fatigue), low energy, difficulty concentrating, mood swings, and poor performance at work or school. There are three different ways to classify insomnia:  Difficulty falling asleep.  Difficulty staying asleep.  Waking up too early in the morning.  Any type of insomnia can be long-term (chronic) or short-term (acute). Both are common. Short-term insomnia usually lasts for three months or less. Chronic insomnia occurs at least three times a week for longer than three months. What are the causes? Insomnia may be caused by another condition, situation, or substance, such as:  Anxiety.  Certain medicines.  Gastroesophageal reflux disease (GERD) or other gastrointestinal conditions.  Asthma or other breathing conditions.  Restless legs syndrome, sleep apnea, or other sleep disorders.  Chronic pain.  Menopause. This may include hot flashes.  Stroke.  Abuse of alcohol, tobacco, or illegal drugs.  Depression.  Caffeine.  Neurological disorders, such as Alzheimer disease.  An overactive thyroid (hyperthyroidism).  The cause of insomnia may not be known. What increases the risk? Risk factors for insomnia include:  Gender. Women are more commonly affected than men.  Age. Insomnia is more common as you get older.  Stress. This may involve your professional or personal life.  Income. Insomnia is more common in people with lower income.  Lack of exercise.  Irregular work schedule or night shifts.  Traveling between different time zones.  What are the signs or symptoms? If you have insomnia, trouble  falling asleep or trouble staying asleep is the main symptom. This may lead to other symptoms, such as:  Feeling fatigued.  Feeling nervous about going to sleep.  Not feeling rested in the morning.  Having trouble concentrating.  Feeling irritable, anxious, or depressed.  How is this treated? Treatment for insomnia depends on the cause. If your insomnia is caused by an underlying condition, treatment will focus on addressing the condition. Treatment may also include:  Medicines to help you sleep.  Counseling or therapy.  Lifestyle adjustments.  Follow these instructions at home:  Take medicines only as directed by your health care provider.  Keep regular sleeping and waking hours. Avoid naps.  Keep a sleep diary to help you and your health care provider figure out what could be causing your insomnia. Include: ? When you sleep. ? When you wake up during the night. ? How well you sleep. ? How rested you feel the next day. ? Any side effects of medicines you are taking. ? What you eat and drink.  Make your bedroom a comfortable place where it is easy to fall asleep: ? Put up shades or special blackout curtains to block light from outside. ? Use a white noise machine to block noise. ? Keep the temperature cool.  Exercise regularly as directed by your health care provider. Avoid exercising right before bedtime.  Use relaxation techniques to manage stress. Ask your health care provider to suggest some techniques that may work well for you. These may include: ? Breathing exercises. ? Routines to release muscle tension. ? Visualizing peaceful scenes.  Cut back on alcohol, caffeinated beverages, and cigarettes, especially close to bedtime. These can  disrupt your sleep.  Do not overeat or eat spicy foods right before bedtime. This can lead to digestive discomfort that can make it hard for you to sleep.  Limit screen use before bedtime. This includes: ? Watching TV. ? Using  your smartphone, tablet, and computer.  Stick to a routine. This can help you fall asleep faster. Try to do a quiet activity, brush your teeth, and go to bed at the same time each night.  Get out of bed if you are still awake after 15 minutes of trying to sleep. Keep the lights down, but try reading or doing a quiet activity. When you feel sleepy, go back to bed.  Make sure that you drive carefully. Avoid driving if you feel very sleepy.  Keep all follow-up appointments as directed by your health care provider. This is important. Contact a health care provider if:  You are tired throughout the day or have trouble in your daily routine due to sleepiness.  You continue to have sleep problems or your sleep problems get worse. Get help right away if:  You have serious thoughts about hurting yourself or someone else. This information is not intended to replace advice given to you by your health care provider. Make sure you discuss any questions you have with your health care provider. Document Released: 01/01/2000 Document Revised: 06/05/2015 Document Reviewed: 10/04/2013 Elsevier Interactive Patient Education  2018 [employer].    Spinal Stenosis Spinal stenosis happens when the open space (spinal canal) between the bones of your spine (vertebrae) gets smaller. It is caused by bone pushing into the open spaces of your backbone (spine). This puts pressure on your backbone and the nerves in your backbone. Treatment often focuses on managing any pain and symptoms. In some cases, surgery may be needed. Follow these instructions at home: Managing pain, stiffness, and swelling  Do all exercises and stretches as told by your doctor.  Stand and sit up straight (use good posture). If you were given a brace or a corset, wear it as told by your doctor.  Do not do any activities that cause pain. Ask your doctor what activities are safe for you.  Do not lift anything that is heavier than 10 lb  (4.5 kg) or heavier than your doctor tells you.  Try to stay at a healthy weight. Talk with your doctor if you need help losing weight.  If directed, put heat on the affected area as often as told by your doctor. Use the heat source that your doctor recommends, such as a moist heat pack or a heating pad. ? Put a towel between your skin and the heat source. ? Leave the heat on for 20-30 minutes. ? Remove the heat if your skin turns bright red. This is especially important if you are not able to feel pain, heat, or cold. You may have a greater risk of getting burned. General instructions  Take over-the-counter and prescription medicines only as told by your doctor.  Do not use any products that contain nicotine or tobacco, such as cigarettes and e-cigarettes. If you need help quitting, ask your doctor.  Eat a healthy diet. This includes plenty of fruits and vegetables, whole grains, and low-fat (lean) protein.  Keep all follow-up visits as told by your doctor. This is important. Contact a doctor if:  Your symptoms do not get better.  Your symptoms get worse.  You have a fever. Get help right away if:  You have new or worse pain  in your neck or upper back.  You have very bad pain that medicine does not control.  You are dizzy.  You have vision problems, blurred vision, or double vision.  You have a very bad headache that is worse when you stand.  You feel sick to your stomach (nauseous).  You throw up (vomit).  You have new or worse numbness or tingling in your back or legs.  You have pain, redness, swelling, or warmth in your arm or leg. Summary  Spinal stenosis happens when the open space (spinal canal) between the bones of your spine gets smaller (narrow).  Contact a doctor if your symptoms get worse.  In some cases, surgery may be needed. This information is not intended to replace advice given to you by your health care provider. Make sure you discuss any questions  you have with your health care provider. Document Released: 04/29/2010 Document Revised: 12/09/2015 Document Reviewed: 12/09/2015 Elsevier Interactive Patient Education  2017 Elsevier Inc.  

## 2016-12-23 ENCOUNTER — Other Ambulatory Visit (INDEPENDENT_AMBULATORY_CARE_PROVIDER_SITE_OTHER): Payer: Medicare Other

## 2016-12-23 DIAGNOSIS — I10 Essential (primary) hypertension: Secondary | ICD-10-CM

## 2016-12-23 DIAGNOSIS — Z13818 Encounter for screening for other digestive system disorders: Secondary | ICD-10-CM

## 2016-12-23 DIAGNOSIS — Z1159 Encounter for screening for other viral diseases: Secondary | ICD-10-CM

## 2016-12-23 DIAGNOSIS — R7303 Prediabetes: Secondary | ICD-10-CM

## 2016-12-23 NOTE — Addendum Note (Signed)
Addended by: WIGGINS, Gennesis Hogland N on: 12/23/2016 10:03 AM   Modules accepted: Orders  

## 2016-12-23 NOTE — Addendum Note (Signed)
Addended by: Penne LashWIGGINS, Ermal Haberer N on: 12/23/2016 10:03 AM   Modules accepted: Orders

## 2016-12-23 NOTE — Addendum Note (Signed)
Addended by: WIGGINS, Adline Kirshenbaum N on: 12/23/2016 10:03 AM   Modules accepted: Orders  

## 2016-12-23 NOTE — Addendum Note (Signed)
Addended by: WIGGINS, Zeynab Klett N on: 12/23/2016 10:03 AM   Modules accepted: Orders  

## 2016-12-23 NOTE — Addendum Note (Signed)
Addended by: WIGGINS, Spurgeon Gancarz N on: 12/23/2016 10:03 AM   Modules accepted: Orders  

## 2016-12-24 LAB — URINALYSIS, ROUTINE W REFLEX MICROSCOPIC
Bilirubin Urine: NEGATIVE
Glucose, UA: NEGATIVE
HGB URINE DIPSTICK: NEGATIVE
Hyaline Cast: NONE SEEN /LPF
KETONES UR: NEGATIVE
Nitrite: NEGATIVE
PH: 5.5 (ref 5.0–8.0)
Protein, ur: NEGATIVE
Specific Gravity, Urine: 1.016 (ref 1.001–1.03)

## 2016-12-24 LAB — HEPATITIS B CORE ANTIBODY, TOTAL: Hep B Core Total Ab: NONREACTIVE

## 2016-12-24 LAB — HEPATITIS B SURFACE ANTIGEN: HEP B S AG: NONREACTIVE

## 2016-12-29 LAB — COMPREHENSIVE METABOLIC PANEL
AG RATIO: 1.4 (calc) (ref 1.0–2.5)
ALKALINE PHOSPHATASE (APISO): 61 U/L (ref 33–130)
ALT: 17 U/L (ref 6–29)
AST: 20 U/L (ref 10–35)
Albumin: 4 g/dL (ref 3.6–5.1)
BILIRUBIN TOTAL: 0.4 mg/dL (ref 0.2–1.2)
BUN: 14 mg/dL (ref 7–25)
CHLORIDE: 101 mmol/L (ref 98–110)
CO2: 26 mmol/L (ref 20–32)
Calcium: 9.5 mg/dL (ref 8.6–10.4)
Creat: 0.85 mg/dL (ref 0.50–0.99)
GLOBULIN: 2.9 g/dL (ref 1.9–3.7)
Glucose, Bld: 113 mg/dL — ABNORMAL HIGH (ref 65–99)
Potassium: 4.2 mmol/L (ref 3.5–5.3)
Sodium: 138 mmol/L (ref 135–146)
Total Protein: 6.9 g/dL (ref 6.1–8.1)

## 2016-12-29 LAB — LIPID PANEL
CHOLESTEROL: 167 mg/dL (ref <200)
HDL: 53 mg/dL (ref >50)
LDL Cholesterol (Calc): 92 mg/dL (calc)
Non-HDL Cholesterol (Calc): 114 mg/dL (calc) (ref <130)
Total CHOL/HDL Ratio: 3.2 (calc) (ref <5.0)
Triglycerides: 128 mg/dL (ref <150)

## 2016-12-29 LAB — HEPATITIS C ANTIBODY
Hepatitis C Ab: NONREACTIVE
SIGNAL TO CUT-OFF: 0.01 (ref <1.00)

## 2016-12-29 LAB — CBC
HCT: 38 % (ref 35.0–45.0)
HEMOGLOBIN: 12.7 g/dL (ref 11.7–15.5)
MCH: 28 pg (ref 27.0–33.0)
MCHC: 33.4 g/dL (ref 32.0–36.0)
MCV: 83.9 fL (ref 80.0–100.0)
MPV: 10.7 fL (ref 7.5–12.5)
Platelets: 335 10*3/uL (ref 140–400)
RBC: 4.53 10*6/uL (ref 3.80–5.10)
RDW: 12.4 % (ref 11.0–15.0)
WBC: 5 10*3/uL (ref 3.8–10.8)

## 2016-12-29 LAB — HEMOGLOBIN A1C
EAG (MMOL/L): 7.3 (calc)
Hgb A1c MFr Bld: 6.2 % of total Hgb — ABNORMAL HIGH (ref <5.7)
Mean Plasma Glucose: 131 (calc)

## 2016-12-29 LAB — HEPATITIS B SURFACE ANTIBODY, QUANTITATIVE: Hepatitis B-Post: <5 m[IU]/mL — ABNORMAL LOW (ref >=10)

## 2016-12-30 LAB — COLOGUARD: Cologuard: NEGATIVE

## 2017-01-09 ENCOUNTER — Telehealth: Payer: Self-pay

## 2017-01-09 NOTE — Telephone Encounter (Signed)
Left message to return call, ok for [facility] to  speak to patient  

## 2017-01-09 NOTE — Telephone Encounter (Signed)
-----   Message from Bevelyn Bucklesracy N McLean-Scocuzza, MD sent at 01/06/2017  7:41 PM EST ----- cologaurd neg repeat in 3 years   TMS

## 2017-01-09 NOTE — Telephone Encounter (Signed)
Reviewed Cologuard results and physician's note with patient. No questions asked. No result note attached.

## 2017-01-18 ENCOUNTER — Telehealth: Payer: Self-pay

## 2017-01-18 NOTE — Telephone Encounter (Signed)
Left message to return call P[facility] may speak to patient

## 2017-01-18 NOTE — Telephone Encounter (Signed)
Copied from CRM [phone]. Topic: Quick Communication - See Telephone Encounter >> Jan 18, 2017 11:17 AM Alisia FerrariBare, Kristen C, CMA wrote: CRM for notification. See Telephone encounter for:  01/18/17.

## 2017-01-18 NOTE — Telephone Encounter (Signed)
-----   Message from Bevelyn Bucklesracy N McLean-Scocuzza, MD sent at 12/15/2016  6:23 PM EST ----- Call patient and ask if had flu shot if so when if not here or pharmacy   Thanks tSM

## 2017-01-18 NOTE — Telephone Encounter (Signed)
Pt returned called and stated that she had received the flu vaccine in October at [address] [facility] [address]

## 2017-02-02 ENCOUNTER — Ambulatory Visit (INDEPENDENT_AMBULATORY_CARE_PROVIDER_SITE_OTHER): Payer: Medicare Other | Admitting: *Deleted

## 2017-02-02 DIAGNOSIS — Z23 Encounter for immunization: Secondary | ICD-10-CM

## 2017-03-07 ENCOUNTER — Ambulatory Visit (INDEPENDENT_AMBULATORY_CARE_PROVIDER_SITE_OTHER): Payer: Medicare Other | Admitting: *Deleted

## 2017-03-07 DIAGNOSIS — Z23 Encounter for immunization: Secondary | ICD-10-CM | POA: Diagnosis not present

## 2017-03-15 ENCOUNTER — Other Ambulatory Visit: Payer: Self-pay | Admitting: Internal Medicine

## 2017-03-15 DIAGNOSIS — G4700 Insomnia, unspecified: Secondary | ICD-10-CM

## 2017-04-27 ENCOUNTER — Ambulatory Visit: Payer: Medicare Other | Admitting: Family Medicine

## 2017-04-27 ENCOUNTER — Encounter: Payer: Self-pay | Admitting: Family Medicine

## 2017-04-27 VITALS — BP 128/82 | HR 79 | Temp 98.4°F | Resp 16 | Wt 187.1 lb

## 2017-04-27 DIAGNOSIS — J309 Allergic rhinitis, unspecified: Secondary | ICD-10-CM

## 2017-04-27 MED ORDER — OLOPATADINE HCL 0.2 % OP SOLN: OPHTHALMIC | 0 refills | Status: DC

## 2017-04-27 MED ORDER — MONTELUKAST SODIUM 10 MG PO TABS: 10.0000 mg | ORAL_TABLET | Freq: Every day | ORAL | 3 refills | Status: DC

## 2017-04-27 MED ORDER — AZELASTINE-FLUTICASONE 137-50 MCG/ACT NA SUSP: 1.0000 | Freq: Every day | NASAL | 3 refills | Status: DC

## 2017-04-27 NOTE — Patient Instructions (Signed)
Please try nasal spray and if you have any symptoms of a rash, this can be stopped. You have taken the ingredients in this spray before without symptoms as we discussed so this is not expected.  Try Allegra which is a switch from [employer] or Claritin which can be helpful.  Montelukast has also been provided to use for symptoms.  Please contact your allergist for follow up also.   It was a pleasure to see you today. Feel better soon.  Allergic Rhinitis, Adult Allergic rhinitis is an allergic reaction that affects the mucous membrane inside the nose. It causes sneezing, a runny or stuffy nose, and the feeling of mucus going down the back of the throat (postnasal drip). Allergic rhinitis can be mild to severe. There are two types of allergic rhinitis:  Seasonal. This type is also called hay fever. It happens only during certain seasons.  Perennial. This type can happen at any time of the year.  What are the causes? This condition happens when the body's defense system (immune system) responds to certain harmless substances called allergens as though they were germs.  Seasonal allergic rhinitis is triggered by pollen, which can come from grasses, trees, and weeds. Perennial allergic rhinitis may be caused by:  House dust mites.  Pet dander.  Mold spores.  What are the signs or symptoms? Symptoms of this condition include:  Sneezing.  Runny or stuffy nose (nasal congestion).  Postnasal drip.  Itchy nose.  Tearing of the eyes.  Trouble sleeping.  Daytime sleepiness.  How is this diagnosed? This condition may be diagnosed based on:  Your medical history.  A physical exam.  Tests to check for related conditions, such as: ? Asthma. ? Pink eye. ? Ear infection. ? Upper respiratory infection.  Tests to find out which allergens trigger your symptoms. These may include skin or blood tests.  How is this treated? There is no cure for this condition, but treatment can  help control symptoms. Treatment may include:  Taking medicines that block allergy symptoms, such as antihistamines. Medicine may be given as a shot, nasal spray, or pill.  Avoiding the allergen.  Desensitization. This treatment involves getting ongoing shots until your body becomes less sensitive to the allergen. This treatment may be done if other treatments do not help.  If taking medicine and avoiding the allergen does not work, new, stronger medicines may be prescribed.  Follow these instructions at home:  Find out what you are allergic to. Common allergens include smoke, dust, and pollen.  Avoid the things you are allergic to. These are some things you can do to help avoid allergens: ? Replace carpet with wood, tile, or vinyl flooring. Carpet can trap dander and dust. ? Do not smoke. Do not allow smoking in your home. ? Change your heating and air conditioning filter at least once a month. ? During allergy season:  Keep windows closed as much as possible.  Plan outdoor activities when pollen counts are lowest. This is usually during the evening hours.  When coming indoors, change clothing and shower before sitting on furniture or bedding.  Take over-the-counter and prescription medicines only as told by your health care provider.  Keep all follow-up visits as told by your health care provider. This is important. Contact a health care provider if:  You have a fever.  You develop a persistent cough.  You make whistling sounds when you breathe (you wheeze).  Your symptoms interfere with your normal daily activities. Get help  right away if:  You have shortness of breath. Summary  This condition can be managed by taking medicines as directed and avoiding allergens.  Contact your health care provider if you develop a persistent cough or fever.  During allergy season, keep windows closed as much as possible. This information is not intended to replace advice given to  you by your health care provider. Make sure you discuss any questions you have with your health care provider. Document Released: 09/28/2000 Document Revised: 02/11/2016 Document Reviewed: 02/11/2016 Elsevier Interactive Patient Education  [employer]2018 Elsevier Inc.

## 2017-04-27 NOTE — Progress Notes (Signed)
Patient ID: Tabitha Lewis, female   DOB: [birth year], 67 y.o.   MRN: [id number]  PCP: McLean-Scocuzza, Pasty Spillers, MD  Subjective:  Tabitha Lewis is a 67 y.o. year old very pleasant female patient who presents with symptoms including nasal congestion, itchy/watery eyes, swollen eyelids, rhinitis, itching in her ears and palate, sneezing, and post nasal drip Associated wheezing and mild SOB have been present intermittently per patient. She reports that this is not present today. -started: 3 weeks ago, symptoms are not improving  -previous treatments: Zyrtec or Claritin have provided limited benefit. Nasal saline rinses have provided limited benefit.  -sick contacts/travel/risks: denies flu exposure or recent sick contact exposure -Hx of: allergies and "allergy shots" for 7 years. She stopped them over one year ago due to taking care of her husband and getting to office for "allergy shots"  ROS-denies fever, NVD, tooth pain, difficulty swallowing or swelling of lips or tongue   Pertinent Past Medical History- HTN  Medications- reviewed  Current Outpatient Medications  Medication Sig Dispense Refill  . cetirizine (ZYRTEC) 10 MG tablet Take by mouth.    . docusate sodium (COLACE) 100 MG capsule Take 100 mg by mouth daily.    Marland Kitchen FLUZONE HIGH-DOSE 0.5 ML injection TO BE ADMINISTERED BY PHARMACIST FOR IMMUNIZATION  0  . lisinopril-hydrochlorothiazide (PRINZIDE,ZESTORETIC) 20-12.5 MG tablet Take 1 tablet by mouth daily. 90 tablet 3  . nitrofurantoin, macrocrystal-monohydrate, (MACROBID) 100 MG capsule Take 1 capsule (100 mg total) by mouth at bedtime. 90 capsule 1  . traZODone (DESYREL) 50 MG tablet Take 1 tablet (50 mg total) by mouth at bedtime as needed for sleep. 90 tablet 1   No current facility-administered medications for this visit.     Objective: BP 128/82 (BP Location: Left Arm, Patient Position: Sitting, Cuff Size: Normal)   Pulse 79   Temp 98.4 F (36.9 C) (Oral)   Resp  16   Wt 187 lb 2 oz (84.9 kg)   SpO2 96%   BMI 32.12 kg/m  Gen: NAD, resting comfortably HEENT: Turbinates erythematous, TMs normal, oropharynx is clear and moist with no sinus tenderness. Watery eyes and mild eyelid edema, rhinitis present  CV: RRR no murmurs rubs or gallops Lungs: CTAB no crackles, wheeze, rhonchi Abdomen: soft/nontender/nondistended/normal bowel sounds. No rebound or guarding.  Ext: no edema Skin: warm, dry, no rash Neuro: grossly normal, moves all extremities  Assessment/Plan: 1. Allergic rhinitis, unspecified seasonality, unspecified trigger Symptoms are most consistent with allergic rhinitis; history of significant allergies and including dermatitis that have been treated by an allergist. No SOB or wheezing present today and VSS which are reassuring. Will treat symptoms by initiating Dymista, Singulair, and Olopatadine. Patient will also try switching from Zyrtec to Allegra to see if this may provide benefit. Further advised her to contact her allergy provider and discuss further evaluation and plans to treat symptoms. She has agreed to scheduled follow up with allergy provider. We reviewed prior allergy list which noted an interaction between Azelastine and fluticasone and reported allergy of benzalkonium chloride. Patient reports that this was a limited rash that was most likely related to ingredient in ointment that was used as she has had this history previously. She has also taken fluticasone previously without reaction. We discussed if she develops a rash or symptoms that indicate an interaction, she will stop Dymista. Advised follow up with allergist and PCP as recommended or sooner if symptoms do not improve.  - Azelastine-Fluticasone 137-50 MCG/ACT SUSP; Place 1 spray into the  nose daily.  Dispense: 1 Bottle; Refill: 3 - montelukast (SINGULAIR) 10 MG tablet; Take 1 tablet (10 mg total) by mouth at bedtime.  Dispense: 30 tablet; Refill: 3 - Olopatadine HCl 0.2 %  SOLN; One drop to each eye daily  Dispense: 1 Bottle; Refill: 0  Finally, we reviewed reasons to return to care including if symptoms worsen or persist or new concerns arise- once again particularly shortness of breath or fever.  Inez CatalinaJulia Ann Ezequiel Macauley, FNP

## 2017-05-19 ENCOUNTER — Other Ambulatory Visit: Payer: Self-pay | Admitting: Family Medicine

## 2017-05-19 DIAGNOSIS — J309 Allergic rhinitis, unspecified: Secondary | ICD-10-CM

## 2017-06-09 ENCOUNTER — Other Ambulatory Visit: Payer: Self-pay | Admitting: Internal Medicine

## 2017-06-09 DIAGNOSIS — N3000 Acute cystitis without hematuria: Secondary | ICD-10-CM

## 2017-06-09 MED ORDER — NITROFURANTOIN MONOHYD MACRO 100 MG PO CAPS: 100.0000 mg | ORAL_CAPSULE | Freq: Every day | ORAL | 1 refills | Status: DC

## 2017-06-14 ENCOUNTER — Other Ambulatory Visit: Payer: Self-pay | Admitting: Internal Medicine

## 2017-06-14 DIAGNOSIS — G4700 Insomnia, unspecified: Secondary | ICD-10-CM

## 2017-06-14 MED ORDER — TRAZODONE HCL 50 MG PO TABS: 50.0000 mg | ORAL_TABLET | Freq: Every evening | ORAL | 1 refills | Status: DC | PRN

## 2017-06-15 ENCOUNTER — Ambulatory Visit: Payer: Medicare Other | Admitting: Internal Medicine

## 2017-06-19 ENCOUNTER — Ambulatory Visit: Payer: Medicare Other | Admitting: Internal Medicine

## 2017-06-19 ENCOUNTER — Encounter: Payer: Self-pay | Admitting: Internal Medicine

## 2017-06-19 VITALS — BP 126/64 | HR 74 | Temp 98.8°F | Ht 64.0 in | Wt 184.8 lb

## 2017-06-19 DIAGNOSIS — G4700 Insomnia, unspecified: Secondary | ICD-10-CM | POA: Diagnosis not present

## 2017-06-19 DIAGNOSIS — N3000 Acute cystitis without hematuria: Secondary | ICD-10-CM | POA: Diagnosis not present

## 2017-06-19 DIAGNOSIS — Z1231 Encounter for screening mammogram for malignant neoplasm of breast: Secondary | ICD-10-CM | POA: Diagnosis not present

## 2017-06-19 DIAGNOSIS — J309 Allergic rhinitis, unspecified: Secondary | ICD-10-CM | POA: Diagnosis not present

## 2017-06-19 DIAGNOSIS — I10 Essential (primary) hypertension: Secondary | ICD-10-CM

## 2017-06-19 DIAGNOSIS — F4321 Adjustment disorder with depressed mood: Secondary | ICD-10-CM

## 2017-06-19 MED ORDER — TRAZODONE HCL 100 MG PO TABS: 100.0000 mg | ORAL_TABLET | Freq: Every evening | ORAL | 1 refills | Status: DC | PRN

## 2017-06-19 MED ORDER — NITROFURANTOIN MONOHYD MACRO 100 MG PO CAPS: 100.0000 mg | ORAL_CAPSULE | Freq: Every day | ORAL | 3 refills | Status: DC

## 2017-06-19 MED ORDER — OLOPATADINE HCL 0.2 % OP SOLN: OPHTHALMIC | 5 refills | Status: DC

## 2017-06-19 NOTE — Progress Notes (Signed)
Pre visit review using our clinic review tool, if applicable. No additional management support is needed unless otherwise documented below in the visit note. 

## 2017-06-19 NOTE — Progress Notes (Signed)
Chief Complaint  Patient presents with  . Follow-up   F/u  1. Grief husband died 3 weeks ago 2/2 metastatic prostate cancer and she is sad and tearful today they were married 59 years. She reports reduced sleep with trazadone 50 mg qhs recently after death of husband  2. Allergic rhinitis h/o allergy shots but singulair and dymista is helping  3. Recurrent UTI macrobid qhs is helping  4. HTN BP controlled today on lis-hctz 20-12.5 qd   Review of Systems  Constitutional: Negative for weight loss.  HENT: Negative for hearing loss.   Eyes: Negative for blurred vision.  Respiratory: Negative for shortness of breath.   Cardiovascular: Negative for chest pain.  Genitourinary:       No uti sx's   Musculoskeletal: Negative for falls.  Skin: Negative for rash.  Neurological: Negative for headaches.  Endo/Heme/Allergies: Positive for environmental allergies.  Psychiatric/Behavioral: The patient has insomnia.        +grief    Past Medical History:  Diagnosis Date  . Allergy    i.e contact allergies [place] [facility]; also plants, trees   . Chicken pox   . Depression   . Family history of breast cancer    BRCA negative in the past  . History of prediabetes   . Hypertension   . Interstitial cystitis   . Interstitial cystitis   . Prediabetes    A1C 6.3 06/14/16  . Spinal stenosis   . UTI (urinary tract infection)   . UTI (urinary tract infection)    Past Surgical History:  Procedure Laterality Date  . denies     denies psuH   Family History  Problem Relation Age of Onset  . Breast cancer Mother 50       s/p b/l masectomy   . Breast cancer Sister 30  . Breast cancer Maternal Aunt   . Breast cancer Maternal Grandmother   . Stroke Father        age 84   . Stroke Other        grandmother age 69   . Lupus Other    Social History   Socioeconomic History  . Marital status: Married    Spouse name: Not on file  . Number of children: Not on file  . Years of  education: Not on file  . Highest education level: Not on file  Occupational History  . Not on file  Social Needs  . Financial resource strain: Not on file  . Food insecurity:    Worry: Not on file    Inability: Not on file  . Transportation needs:    Medical: Not on file    Non-medical: Not on file  Tobacco Use  . Smoking status: Former Research scientist (life sciences)  . Smokeless tobacco: Never Used  . Tobacco comment: smoked 4-5 years in college then quit  Substance and Sexual Activity  . Alcohol use: No    Frequency: Never  . Drug use: No  . Sexual activity: Not on file  Lifestyle  . Physical activity:    Days per week: Not on file    Minutes per session: Not on file  . Stress: Not on file  Relationships  . Social connections:    Talks on phone: Not on file    Gets together: Not on file    Attends religious service: Not on file    Active member of club or organization: Not on file    Attends meetings of clubs or organizations: Not  on file    Relationship status: Not on file  . Intimate partner violence:    Fear of current or ex partner: Not on file    Emotionally abused: Not on file    Physically abused: Not on file    Forced sexual activity: Not on file  Other Topics Concern  . Not on file  Social History Narrative   1 daughter and 1 son    3 grandsons    Married to high school sweet heart (husband) dx'ed with prostate cancer in 2017    Retired Engineer, manufacturing education    Current Meds  Medication Sig  . Azelastine-Fluticasone 137-50 MCG/ACT SUSP Place 1 spray into the nose daily.  . cetirizine (ZYRTEC) 10 MG tablet Take by mouth.  . docusate sodium (COLACE) 100 MG capsule Take 100 mg by mouth daily.  Marland Kitchen FLUZONE HIGH-DOSE 0.5 ML injection TO BE ADMINISTERED BY PHARMACIST FOR IMMUNIZATION  . lisinopril-hydrochlorothiazide (PRINZIDE,ZESTORETIC) 20-12.5 MG tablet Take 1 tablet by mouth daily.  . montelukast (SINGULAIR) 10 MG tablet Take 1 tablet (10 mg total) by mouth at bedtime.   . nitrofurantoin, macrocrystal-monohydrate, (MACROBID) 100 MG capsule Take 1 capsule (100 mg total) by mouth at bedtime.  . Olopatadine HCl 0.2 % SOLN One drop to each eye daily  . traZODone (DESYREL) 50 MG tablet Take 1 tablet (50 mg total) by mouth at bedtime as needed for sleep.   Allergies  Allergen Reactions  . Benzalkonium Chloride Rash  . Cetyl Alcohol Rash  . Codeine Rash  . Lanolin Rash  . Neomycin Rash  . Nickel Rash  . Propylene Glycol Rash   No results found for this or any previous visit (from the past 2160 hour(s)). Objective  Body mass index is 31.72 kg/m. Wt Readings from Last 3 Encounters:  06/19/17 184 lb 12.8 oz (83.8 kg)  04/27/17 187 lb 2 oz (84.9 kg)  12/15/16 189 lb 2 oz (85.8 kg)   Temp Readings from Last 3 Encounters:  06/19/17 98.8 F (37.1 C) (Oral)  04/27/17 98.4 F (36.9 C) (Oral)  12/15/16 97.8 F (36.6 C) (Oral)   BP Readings from Last 3 Encounters:  06/19/17 126/64  04/27/17 128/82  12/15/16 130/84   Pulse Readings from Last 3 Encounters:  06/19/17 74  04/27/17 79  12/15/16 92    Physical Exam  Constitutional: She is oriented to person, place, and time. Vital signs are normal. She appears well-developed and well-nourished. She is cooperative.  HENT:  Head: Normocephalic and atraumatic.  Mouth/Throat: Oropharynx is clear and moist and mucous membranes are normal.  Eyes: Pupils are equal, round, and reactive to light. Conjunctivae are normal.  Cardiovascular: Normal rate, regular rhythm and normal heart sounds.  Pulmonary/Chest: Effort normal and breath sounds normal.  Neurological: She is alert and oriented to person, place, and time. Gait normal.  Skin: Skin is warm, dry and intact.  Psychiatric: She has a normal mood and affect. Her speech is normal and behavior is normal. Judgment and thought content normal. Cognition and memory are normal.  Nursing note and vitals reviewed.   Assessment   1. Grief with insomnia 2. Allergic  rhinitis and conjunctivitis  3. Recurrent uti  4. HTN  5. HM Plan   1. Refer to [facility]  Increase trazadone to 100 mg qhs  2. Refilled pataday drops 3. Refilled macrobid  4. Cont meds  5.  ? If had flu shot   Tdap had 07/22/15 pna 23 11/20/14,  consider prevnar in future if has not had  3rd hep B vaccine due at f/u or 08/03/17  TSH nl 06/14/16 3.464  Last pap 07/22/15 negative HPV neg  Mammogram 07/04/16 negative referred today  Pt wants to hold on dexa fo rnow  cologaurd neg 12/30/16  F/u [place] [facility]       Provider: Dr. Olivia Mackie McLean-Scocuzza-Internal Medicine

## 2017-06-19 NOTE — Patient Instructions (Signed)
Follow up end of 08/2017  I referred you to [facility]   Coping With Loss, Adult People experience loss in many different ways throughout their lives. Events such as moving, changing jobs, and losing friends can create a sense of loss. The loss may be as serious as a major health change, divorce, death of a pet, or death of a loved one. All of these types of loss are likely to create a physical and emotional reaction known as grief. Grief is the result of a major change or an absence of something or someone that you count on. Grief is a normal reaction to loss. How to recognize changes A variety of factors can affect your grieving experience, including:  The nature of your loss.  Your relationship to what or whom you lost.  Your understanding of grief and how to cope with it.  Your support system.  The way that you deal with your grief will affect your ability to function as you normally do. When you are grieving, you may experience:  Numbness, shock, sadness, anxiety, anger, denial, and guilt.  Thoughts about death.  Unexpected crying.  A physical sensation of emptiness in your gut.  Problems sleeping and eating.  Fatigue.  Loss of interest in normal activities.  Dreaming about or imagining seeing the person who died.  A need to remember what or whom you lost.  Difficulty thinking about anything other than your loss for a period of time.  Relief. If you have been expecting the loss for a while, you may feel a sense of relief when it happens.  Where to find support To get support for coping with loss:  Ask your health care provider for help and recommendations, such as grief counseling or therapy.  Think about joining a support group for people who are coping with loss.  Follow these instructions at home:  Be patient with yourself and others. Allow the grieving process to happen, and remember that grieving takes time. ? It is likely that you may never feel completely done  with some grief. You may find a way to move on while still cherishing memories and feelings about your loss. ? Accepting your loss is a process. It can take months or longer to adjust.  Express your feelings in healthy ways, such as: ? Talking with others about your loss. It may be helpful to find others who have had a similar loss, such as a support group. ? Writing down your feelings in a journal. ? Doing physical activities to release stress and emotional energy. ? Doing creative activities like painting, sculpting, or playing or listening to music. ? Practicing resilience. This is the ability to recover and adjust after facing challenges. Reading some resources that encourage resilience may help you to learn ways to practice those behaviors.  Keep to your normal routine as much as possible. If you have trouble focusing or doing normal activities, it is acceptable to take some time away from your normal routine.  Spend time with friends and loved ones.  Eat a healthy diet, get plenty of sleep, and rest when you feel tired. Where to find more information: You can find more information about coping with loss from:  American Society of Clinical Oncology: www.cancer.net  American Psychological Association: DiceTournament.cawww.apa.org  Contact a health care provider if:  Your grief is extreme and keeps getting worse.  You have ongoing grief that does not improve.  Your body shows symptoms of grief, such as illness.  You  feel depressed, anxious, or lonely. Get help right away if:  You have thoughts about hurting yourself or others. If you ever feel like you may hurt yourself or others, or have thoughts about taking your own life, get help right away. You can go to your nearest emergency department or call:  Your local emergency services (911 in the [place]  A suicide crisis helpline, such as the National Suicide Prevention Lifeline at (480)301-7725. This is open 24 hours a day.  Summary  Grief  is a normal part of experiencing a loss. It is the result of a major change or an absence of something or someone that you count on.  The depth of grief and the period of recovery depend on the type of loss as well as your ability to adjust to the change and process your feelings.  Processing grief requires patience and a willingness to accept your feelings and talk about your loss with people who are supportive.  It is important to find resources that work for you and to realize that we are all different when it comes to grief. There is not one single grieving process that works for everyone in the same way.  Be aware that when grief becomes extreme, it can lead to more severe issues like isolation, depression, anxiety, or suicidal thoughts. Talk with your health care provider if you have any of these issues. This information is not intended to replace advice given to you by your health care provider. Make sure you discuss any questions you have with your health care provider. Document Released: 05/19/2016 Document Revised: 05/19/2016 Document Reviewed: 05/19/2016 Elsevier Interactive Patient Education  2018 [employer].

## 2017-06-30 ENCOUNTER — Ambulatory Visit (INDEPENDENT_AMBULATORY_CARE_PROVIDER_SITE_OTHER): Payer: Medicare Other | Admitting: Psychology

## 2017-06-30 DIAGNOSIS — F4323 Adjustment disorder with mixed anxiety and depressed mood: Secondary | ICD-10-CM | POA: Diagnosis not present

## 2017-07-07 ENCOUNTER — Ambulatory Visit
Admission: RE | Admit: 2017-07-07 | Discharge: 2017-07-07 | Disposition: A | Discharge status: Home / Self Care | Payer: Medicare Other | Source: Ambulatory Visit | Attending: Internal Medicine | Admitting: Internal Medicine

## 2017-07-07 DIAGNOSIS — Z1231 Encounter for screening mammogram for malignant neoplasm of breast: Secondary | ICD-10-CM | POA: Diagnosis present

## 2017-07-07 IMAGING — MG MM DIGITAL SCREENING BILAT W/ TOMO W/ CAD
6 of 10 series · 6 of 30 positions shown · non-contrast
Comparison: Previous exam(s).

CLINICAL DATA: Screening.

EXAM:
DIGITAL SCREENING BILATERAL MAMMOGRAM WITH TOMO AND CAD

[L CC synth-2D]
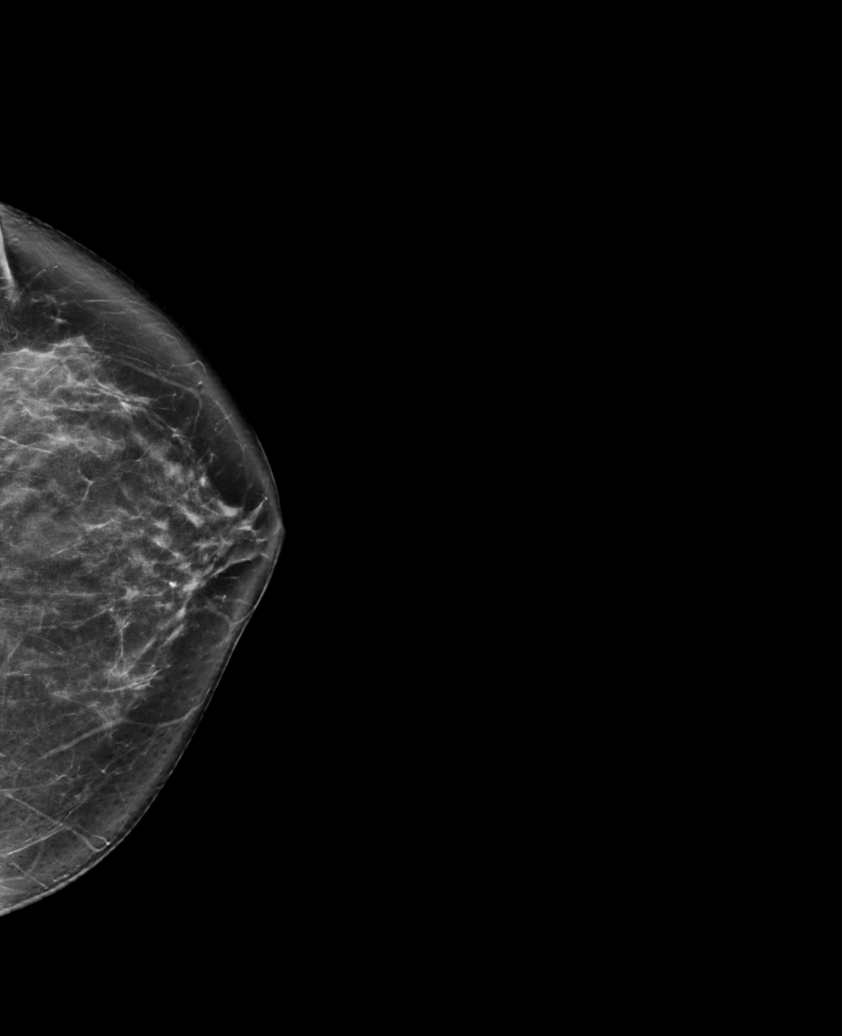

[R MLO synth-2D (1 of 2)]
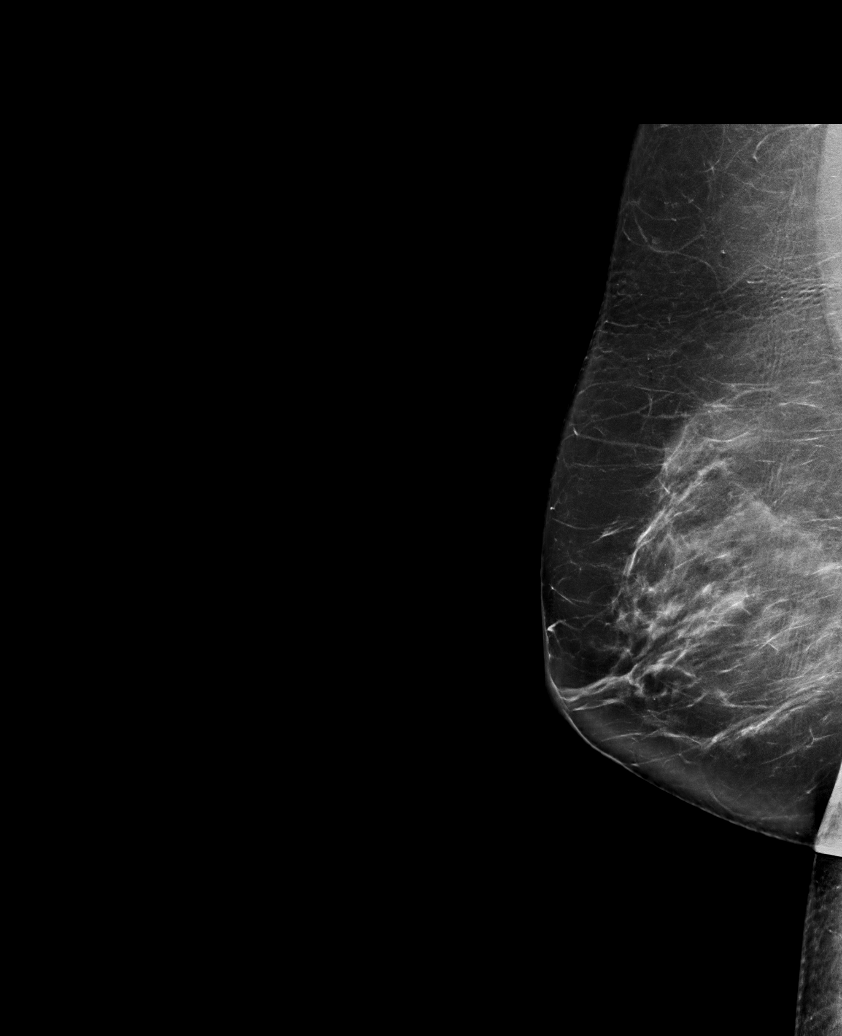

[L MLO synth-2D]
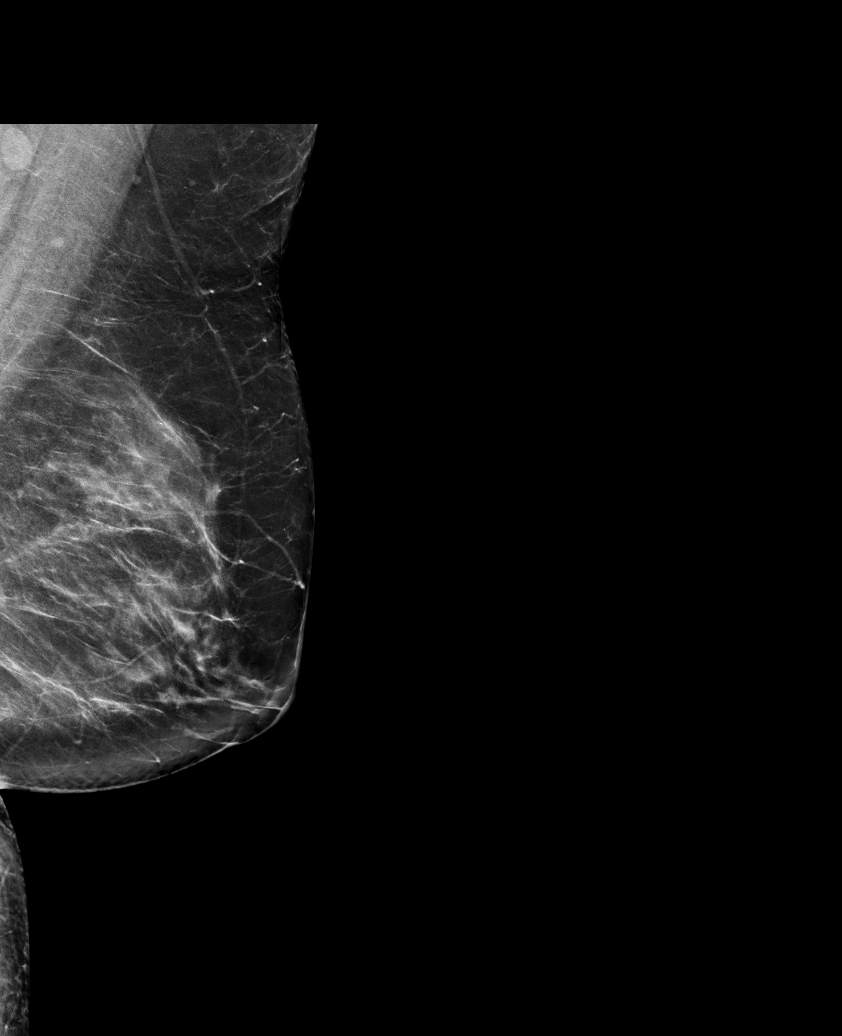

[R CC synth-2D]
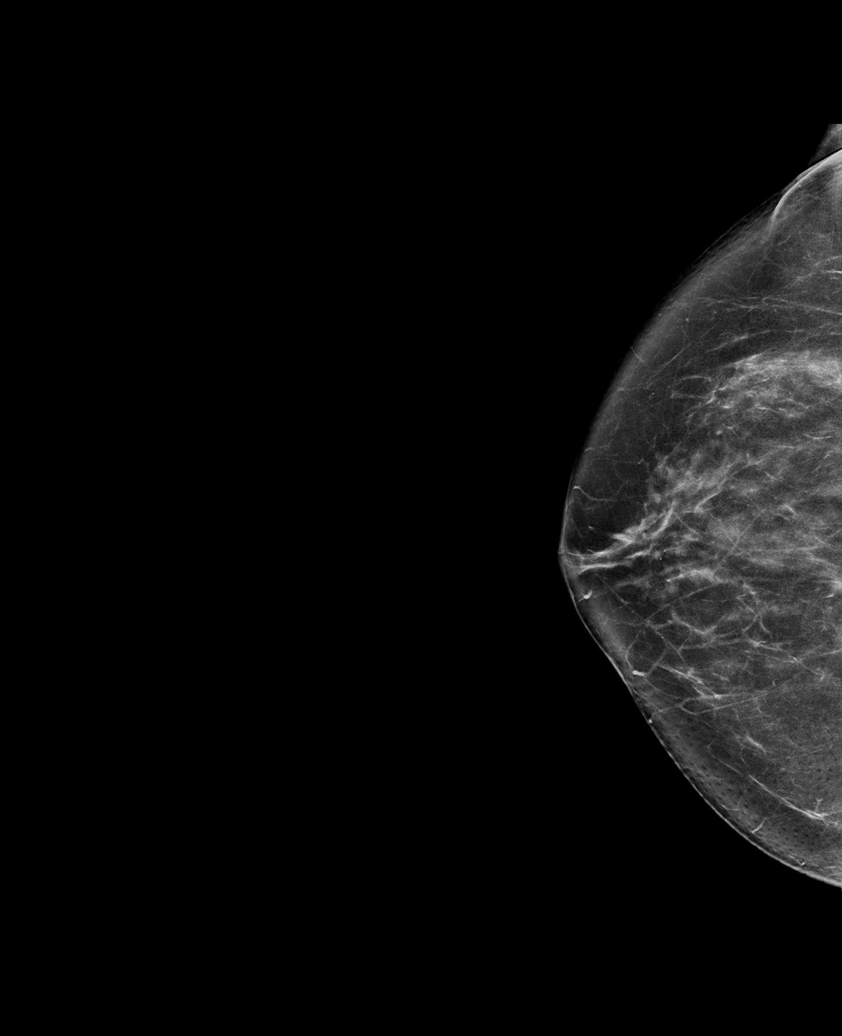

[R MLO synth-2D (2 of 2)]
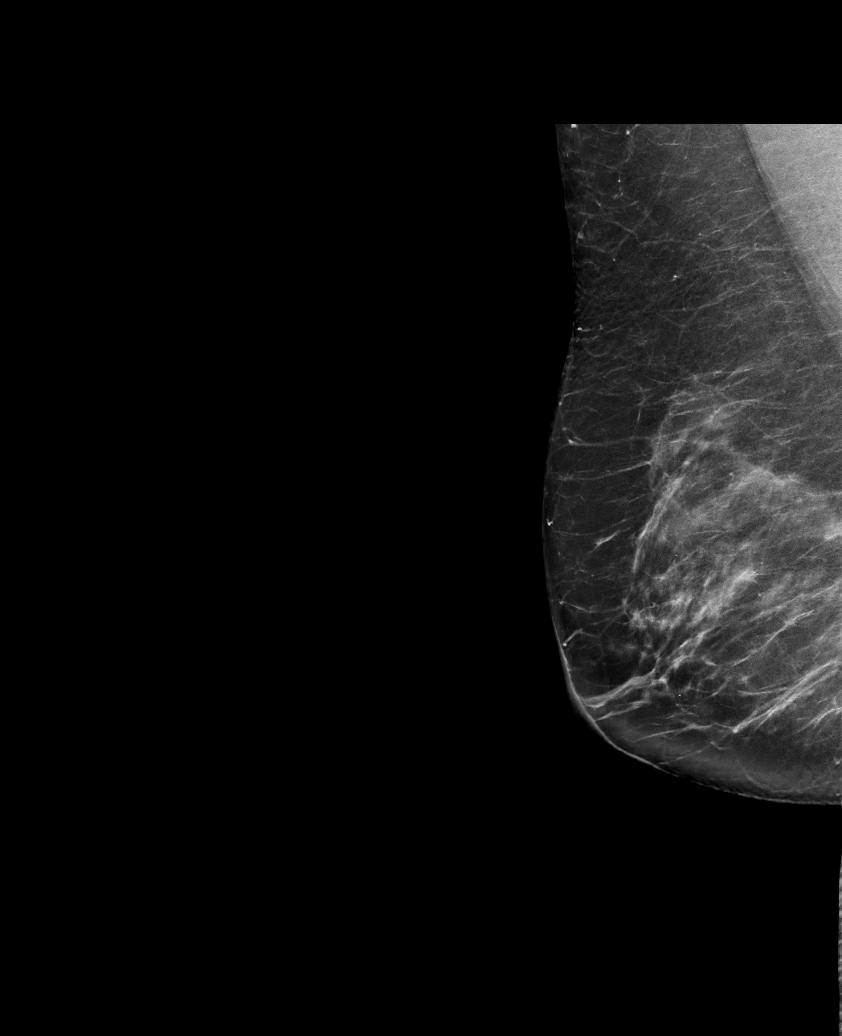

[R MLO tomo · tomo slice 41/82.0]
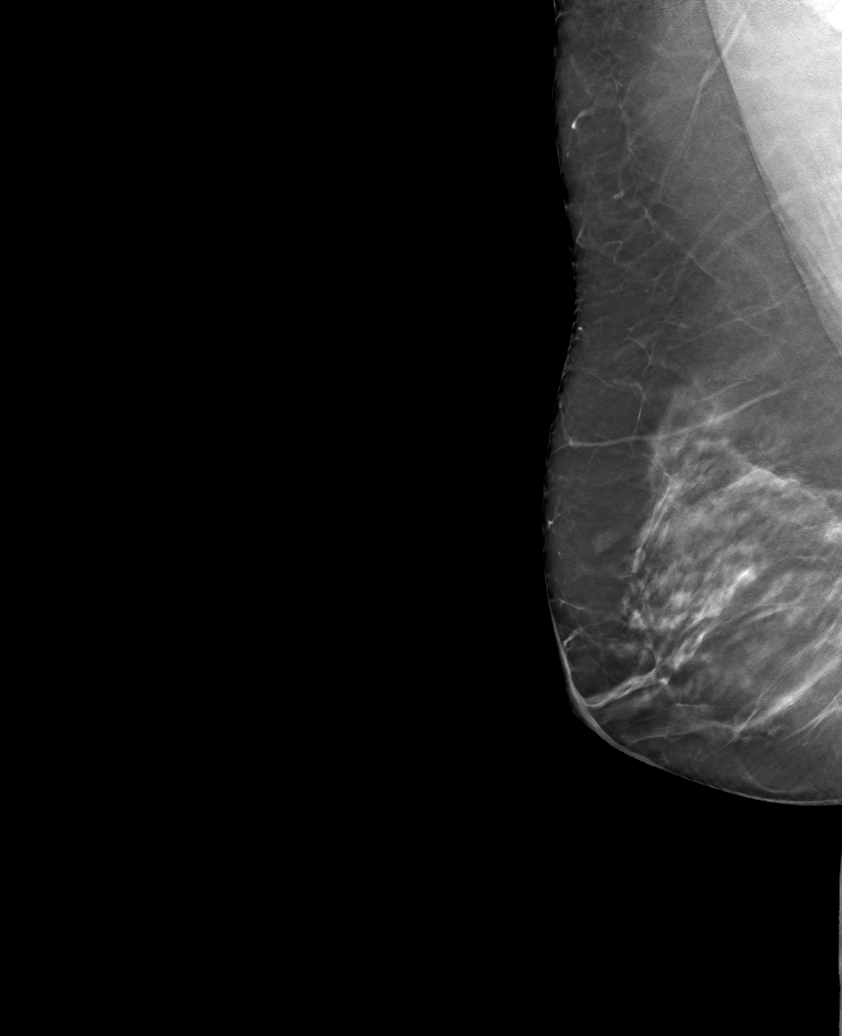

[6 of 30 positions shown; findings below may reference images not displayed]

ACR Breast Density Category c: The breast tissue is heterogeneously
dense, which may obscure small masses.
FINDINGS: There are no findings suspicious for malignancy. Images were
processed with CAD.
IMPRESSION: No mammographic evidence of malignancy. A result letter of this
screening mammogram will be mailed directly to the patient.

RECOMMENDATION:
Screening mammogram in one year. (Code:[5V])

BI-RADS CATEGORY  1: Negative.

## 2017-07-23 ENCOUNTER — Other Ambulatory Visit: Payer: Self-pay | Admitting: Family Medicine

## 2017-07-23 DIAGNOSIS — J309 Allergic rhinitis, unspecified: Secondary | ICD-10-CM

## 2017-08-03 ENCOUNTER — Ambulatory Visit (INDEPENDENT_AMBULATORY_CARE_PROVIDER_SITE_OTHER): Payer: Medicare Other | Admitting: *Deleted

## 2017-08-03 DIAGNOSIS — Z23 Encounter for immunization: Secondary | ICD-10-CM | POA: Diagnosis not present

## 2017-08-03 NOTE — Progress Notes (Signed)
Patient tolerated well.

## 2017-08-04 ENCOUNTER — Encounter: Payer: Self-pay | Admitting: Internal Medicine

## 2017-08-04 ENCOUNTER — Ambulatory Visit: Payer: Medicare Other | Admitting: Internal Medicine

## 2017-08-04 VITALS — BP 144/80 | HR 75 | Temp 98.0°F | Ht 64.0 in | Wt 185.0 lb

## 2017-08-04 DIAGNOSIS — N3000 Acute cystitis without hematuria: Secondary | ICD-10-CM | POA: Diagnosis not present

## 2017-08-04 MED ORDER — NITROFURANTOIN MONOHYD MACRO 100 MG PO CAPS: 100.0000 mg | ORAL_CAPSULE | Freq: Two times a day (BID) | ORAL | 0 refills | Status: DC

## 2017-08-04 NOTE — Patient Instructions (Signed)
F/u as scheduled   Pneumococcal Conjugate Vaccine (PCV13) What You Need to Know 1. Why get vaccinated? Vaccination can protect both children and adults from pneumococcal disease. Pneumococcal disease is caused by bacteria that can spread from person to person through close contact. It can cause ear infections, and it can also lead to more serious infections of the:  Lungs (pneumonia),  Blood (bacteremia), and  Covering of the brain and spinal cord (meningitis).  Pneumococcal pneumonia is most common among adults. Pneumococcal meningitis can cause deafness and brain damage, and it kills about 1 child in 10 who get it. Anyone can get pneumococcal disease, but children under 572 years of age and adults 1465 years and older, people with certain medical conditions, and cigarette smokers are at the highest risk. Before there was a vaccine, the [place] saw:  more than 700 cases of meningitis,  about 13,000 blood infections,  about 5 million ear infections, and  about 200 deaths  in children under 5 each year from pneumococcal disease. Since vaccine became available, severe pneumococcal disease in these children has fallen by 88%. About 18,000 older adults die of pneumococcal disease each year in the [place]. Treatment of pneumococcal infections with penicillin and other drugs is not as effective as it used to be, because some strains of the disease have become resistant to these drugs. This makes prevention of the disease, through vaccination, even more important. 2. PCV13 vaccine Pneumococcal conjugate vaccine (called PCV13) protects against 13 types of pneumococcal bacteria. PCV13 is routinely given to children at 2, 4, 6, and 5512-2215 months of age. It is also recommended for children and adults 442 to 69 years of age with certain health conditions, and for all adults 765 years of age and older. Your doctor can give you details. 3. Some people should not get this vaccine Anyone who has  ever had a life-threatening allergic reaction to a dose of this vaccine, to an earlier pneumococcal vaccine called PCV7, or to any vaccine containing diphtheria toxoid (for example, DTaP), should not get PCV13. Anyone with a severe allergy to any component of PCV13 should not get the vaccine. Tell your doctor if the person being vaccinated has any severe allergies. If the person scheduled for vaccination is not feeling well, your healthcare provider might decide to reschedule the shot on another day. 4. Risks of a vaccine reaction With any medicine, including vaccines, there is a chance of reactions. These are usually mild and go away on their own, but serious reactions are also possible. Problems reported following PCV13 varied by age and dose in the series. The most common problems reported among children were:  About half became drowsy after the shot, had a temporary loss of appetite, or had redness or tenderness where the shot was given.  About 1 out of 3 had swelling where the shot was given.  About 1 out of 3 had a mild fever, and about 1 in 20 had a fever over 102.40F.  Up to about 8 out of 10 became fussy or irritable.  Adults have reported pain, redness, and swelling where the shot was given; also mild fever, fatigue, headache, chills, or muscle pain. Young children who get PCV13 along with inactivated flu vaccine at the same time may be at increased risk for seizures caused by fever. Ask your doctor for more information. Problems that could happen after any vaccine:  People sometimes faint after a medical procedure, including vaccination. Sitting or lying down for about  15 minutes can help prevent fainting, and injuries caused by a fall. Tell your doctor if you feel dizzy, or have vision changes or ringing in the ears.  Some older children and adults get severe pain in the shoulder and have difficulty moving the arm where a shot was given. This happens very rarely.  Any medication  can cause a severe allergic reaction. Such reactions from a vaccine are very rare, estimated at about 1 in a million doses, and would happen within a few minutes to a few hours after the vaccination. As with any medicine, there is a very small chance of a vaccine causing a serious injury or death. The safety of vaccines is always being monitored. For more information, visit: http://floyd.org/ 5. What if there is a serious reaction? What should I look for? Look for anything that concerns you, such as signs of a severe allergic reaction, very high fever, or unusual behavior. Signs of a severe allergic reaction can include hives, swelling of the face and throat, difficulty breathing, a fast heartbeat, dizziness, and weakness-usually within a few minutes to a few hours after the vaccination. What should I do?  If you think it is a severe allergic reaction or other emergency that can't wait, call 9-1-1 or get the person to the nearest hospital. Otherwise, call your doctor.  Reactions should be reported to the Vaccine Adverse Event Reporting System (VAERS). Your doctor should file this report, or you can do it yourself through the VAERS web site at www.vaers.LAgents.no, or by calling 1-332-289-4725. ? VAERS does not give medical advice. 6. The National Vaccine Injury Compensation Program The [employer] Vaccine Injury Compensation Program (VICP) is a federal program that was created to compensate people who may have been injured by certain vaccines. Persons who believe they may have been injured by a vaccine can learn about the program and about filing a claim by calling 1-6050224277 or visiting the VICP website at SpiritualWord.at. There is a time limit to file a claim for compensation. 7. How can I learn more?  Ask your healthcare provider. He or she can give you the vaccine package insert or suggest other sources of information.  Call your local or state health  department.  Contact the Centers for Disease Control and Prevention (CDC): ? Call (615)229-9720 (1-800-CDC-INFO) or ? Visit CDC's website at PicCapture.uy Vaccine Information Statement, PCV13 Vaccine (11/21/2013) This information is not intended to replace advice given to you by your health care provider. Make sure you discuss any questions you have with your health care provider. Document Released: 10/31/2005 Document Revised: 09/24/2015 Document Reviewed: 09/24/2015 Elsevier Interactive Patient Education  2017 [employer].

## 2017-08-04 NOTE — Progress Notes (Signed)
Pre visit review using our clinic review tool, if applicable. No additional management support is needed unless otherwise documented below in the visit note. 

## 2017-08-04 NOTE — Progress Notes (Signed)
Chief Complaint  Patient presents with  . Urinary Tract Infection   F/u UTI had dysuria, increased freq, and urgency x few days no ab pain or back pain or fever. Tried AZO   HTN BP elevated today thinks didn't take BP meds today   Review of Systems  Gastrointestinal: Negative for abdominal pain.  Genitourinary: Positive for dysuria, frequency and urgency.  Musculoskeletal: Negative for back pain.   Past Medical History:  Diagnosis Date  . Allergy    i.e contact allergies [place] [facility]; also plants, trees   . Chicken pox   . Depression   . Family history of breast cancer    BRCA negative in the past  . History of prediabetes   . Hypertension   . Interstitial cystitis   . Interstitial cystitis   . Prediabetes    A1C 6.3 06/14/16  . Spinal stenosis   . UTI (urinary tract infection)   . UTI (urinary tract infection)    Past Surgical History:  Procedure Laterality Date  . denies     denies psuH   Family History  Problem Relation Age of Onset  . Breast cancer Mother 12       s/p b/l masectomy   . Breast cancer Sister 73  . Breast cancer Maternal Aunt   . Breast cancer Maternal Grandmother   . Stroke Father        age 27   . Stroke Other        grandmother age 56   . Lupus Other    Social History   Socioeconomic History  . Marital status: Married    Spouse name: Not on file  . Number of children: Not on file  . Years of education: Not on file  . Highest education level: Not on file  Occupational History  . Not on file  Social Needs  . Financial resource strain: Not on file  . Food insecurity:    Worry: Not on file    Inability: Not on file  . Transportation needs:    Medical: Not on file    Non-medical: Not on file  Tobacco Use  . Smoking status: Former Research scientist (life sciences)  . Smokeless tobacco: Never Used  . Tobacco comment: smoked 4-5 years in college then quit  Substance and Sexual Activity  . Alcohol use: No    Frequency: Never  . Drug use: No   . Sexual activity: Not on file  Lifestyle  . Physical activity:    Days per week: Not on file    Minutes per session: Not on file  . Stress: Not on file  Relationships  . Social connections:    Talks on phone: Not on file    Gets together: Not on file    Attends religious service: Not on file    Active member of club or organization: Not on file    Attends meetings of clubs or organizations: Not on file    Relationship status: Not on file  . Intimate partner violence:    Fear of current or ex partner: Not on file    Emotionally abused: Not on file    Physically abused: Not on file    Forced sexual activity: Not on file  Other Topics Concern  . Not on file  Social History Narrative   1 daughter and 1 son    3 grandsons    Married to high school sweet heart (husband) x 43 years dx'ed with prostate cancer in 2017  he died 05/2017    Retired Engineer, manufacturing education    Current Meds  Medication Sig  . Azelastine-Fluticasone 137-50 MCG/ACT SUSP Place 1 spray into the nose daily.  . cetirizine (ZYRTEC) 10 MG tablet Take by mouth.  . docusate sodium (COLACE) 100 MG capsule Take 100 mg by mouth daily.  Marland Kitchen FLUZONE HIGH-DOSE 0.5 ML injection TO BE ADMINISTERED BY PHARMACIST FOR IMMUNIZATION  . lisinopril-hydrochlorothiazide (PRINZIDE,ZESTORETIC) 20-12.5 MG tablet Take 1 tablet by mouth daily.  . montelukast (SINGULAIR) 10 MG tablet Take 1 tablet (10 mg total) by mouth at bedtime.  . nitrofurantoin, macrocrystal-monohydrate, (MACROBID) 100 MG capsule Take 1 capsule (100 mg total) by mouth at bedtime.  . Olopatadine HCl 0.2 % SOLN One drop to each eye daily  . traZODone (DESYREL) 100 MG tablet Take 1 tablet (100 mg total) by mouth at bedtime as needed for sleep.   Allergies  Allergen Reactions  . Benzalkonium Chloride Rash  . Cetyl Alcohol Rash  . Codeine Rash  . Lanolin Rash  . Neomycin Rash  . Nickel Rash  . Propylene Glycol Rash   No results found for this or any previous  visit (from the past 2160 hour(s)). Objective  Body mass index is 31.76 kg/m. Wt Readings from Last 3 Encounters:  08/04/17 185 lb (83.9 kg)  06/19/17 184 lb 12.8 oz (83.8 kg)  04/27/17 187 lb 2 oz (84.9 kg)   Temp Readings from Last 3 Encounters:  08/04/17 98 F (36.7 C) (Oral)  06/19/17 98.8 F (37.1 C) (Oral)  04/27/17 98.4 F (36.9 C) (Oral)   BP Readings from Last 3 Encounters:  08/04/17 (!) 144/80  06/19/17 126/64  04/27/17 128/82   Pulse Readings from Last 3 Encounters:  08/04/17 75  06/19/17 74  04/27/17 79    Physical Exam  Constitutional: She appears well-developed and well-nourished. She is cooperative.  HENT:  Head: Normocephalic and atraumatic.  Mouth/Throat: Oropharynx is clear and moist and mucous membranes are normal.  Eyes: Pupils are equal, round, and reactive to light. Conjunctivae are normal.  Cardiovascular: Normal rate, regular rhythm and normal heart sounds.  Pulmonary/Chest: Effort normal and breath sounds normal.  Abdominal: Soft. Bowel sounds are normal. There is no tenderness.  Neurological: She is alert.  Skin: Skin is warm, dry and intact.  Psychiatric: She has a normal mood and affect. Her speech is normal and behavior is normal. Judgment and thought content normal. Cognition and memory are normal.  Nursing note and vitals reviewed.   Assessment   1. UTI 2 .HTN Plan   1. macrobid bid x 5 days 100 mg then continue 100 mg qhs  UA and culture  2. Monitor BP at f/u   Provider: Dr. Olivia Mackie McLean-Scocuzza-Internal Medicine

## 2017-08-05 LAB — URINALYSIS, ROUTINE W REFLEX MICROSCOPIC
BILIRUBIN UA: NEGATIVE
Glucose, UA: NEGATIVE
Ketones, UA: NEGATIVE
Nitrite, UA: NEGATIVE
PH UA: 5 (ref 5.0–7.5)
PROTEIN UA: NEGATIVE
RBC UA: NEGATIVE
Specific Gravity, UA: 1.014 (ref 1.005–1.030)
Urobilinogen, Ur: 0.2 mg/dL (ref 0.2–1.0)

## 2017-08-05 LAB — MICROSCOPIC EXAMINATION
Casts: NONE SEEN /lpf
WBC, UA: >30 /hpf — AB (ref 0–5)

## 2017-08-06 ENCOUNTER — Other Ambulatory Visit: Payer: Self-pay | Admitting: Internal Medicine

## 2017-08-06 DIAGNOSIS — N3000 Acute cystitis without hematuria: Secondary | ICD-10-CM

## 2017-08-06 LAB — URINE CULTURE

## 2017-08-06 MED ORDER — CIPROFLOXACIN HCL 500 MG PO TABS: 500.0000 mg | ORAL_TABLET | Freq: Two times a day (BID) | ORAL | 0 refills | Status: DC

## 2017-08-18 ENCOUNTER — Other Ambulatory Visit: Payer: Self-pay | Admitting: Internal Medicine

## 2017-08-18 DIAGNOSIS — R350 Frequency of micturition: Secondary | ICD-10-CM

## 2017-08-18 DIAGNOSIS — N3000 Acute cystitis without hematuria: Secondary | ICD-10-CM

## 2017-09-13 ENCOUNTER — Ambulatory Visit: Payer: Medicare Other | Admitting: Internal Medicine

## 2017-09-13 ENCOUNTER — Encounter: Payer: Self-pay | Admitting: Internal Medicine

## 2017-09-13 VITALS — BP 106/62 | HR 67 | Temp 98.6°F | Ht 64.0 in | Wt 183.0 lb

## 2017-09-13 DIAGNOSIS — I10 Essential (primary) hypertension: Secondary | ICD-10-CM | POA: Diagnosis not present

## 2017-09-13 DIAGNOSIS — Z1159 Encounter for screening for other viral diseases: Secondary | ICD-10-CM

## 2017-09-13 DIAGNOSIS — Z23 Encounter for immunization: Secondary | ICD-10-CM

## 2017-09-13 DIAGNOSIS — Z1389 Encounter for screening for other disorder: Secondary | ICD-10-CM

## 2017-09-13 DIAGNOSIS — N3010 Interstitial cystitis (chronic) without hematuria: Secondary | ICD-10-CM | POA: Diagnosis not present

## 2017-09-13 DIAGNOSIS — Z1329 Encounter for screening for other suspected endocrine disorder: Secondary | ICD-10-CM

## 2017-09-13 DIAGNOSIS — N3000 Acute cystitis without hematuria: Secondary | ICD-10-CM

## 2017-09-13 DIAGNOSIS — F4321 Adjustment disorder with depressed mood: Secondary | ICD-10-CM | POA: Diagnosis not present

## 2017-09-13 DIAGNOSIS — E559 Vitamin D deficiency, unspecified: Secondary | ICD-10-CM

## 2017-09-13 DIAGNOSIS — Z0184 Encounter for antibody response examination: Secondary | ICD-10-CM

## 2017-09-13 NOTE — Patient Instructions (Addendum)
D mannose supplement  sch fasting labs 12/25/17  Calcium 600 mg 2x per day  Vitamin D3 1000 IU daily     Interstitial Cystitis Interstitial cystitis is a condition that causes inflammation of the bladder. The bladder is a hollow organ in the lower part of your abdomen. It stores urine after the urine is made by your kidneys. With interstitial cystitis, you may have pain in the bladder area. You may also have a frequent and urgent need to urinate. The severity of interstitial cystitis can vary from person to person. You may have flare-ups of the condition, and then it may go away for a while. For many people who have this condition, it becomes a long-term problem. What are the causes? The cause of this condition is not known. What increases the risk? This condition is more likely to develop in women. What are the signs or symptoms? Symptoms of interstitial cystitis vary, and they can change over time. Symptoms may include:  Discomfort or pain in the bladder area. This can range from mild to severe. The pain may change in intensity as the bladder fills with urine or as it empties.  Pelvic pain.  An urgent need to urinate.  Frequent urination.  Pain during sexual intercourse.  Pinpoint bleeding on the bladder wall.  For women, the symptoms often get worse during menstruation. How is this diagnosed? This condition is diagnosed by evaluating your symptoms and ruling out other causes. A physical exam will be done. Various tests may be done to rule out other conditions. Common tests include:  Urine tests.  Cystoscopy. In this test, a tool that is like a very thin telescope is used to look into your bladder.  Biopsy. This involves taking a sample of tissue from the bladder wall to be examined under a microscope.  How is this treated? There is no cure for interstitial cystitis, but treatment methods are available to control your symptoms. Work closely with your health care provider to  find the treatments that will be most effective for you. Treatment options may include:  Medicines to relieve pain and to help reduce the number of times that you feel the need to urinate.  Bladder training. This involves learning ways to control when you urinate, such as: ? Urinating at scheduled times. ? Training yourself to delay urination. ? Doing exercises (Kegel exercises) to strengthen the muscles that control urine flow.  Lifestyle changes, such as changing your diet or taking steps to control stress.  Use of a device that provides electrical stimulation in order to reduce pain.  A procedure that stretches your bladder by filling it with air or fluid.  Surgery. This is rare. It is only done for extreme cases if other treatments do not help.  Follow these instructions at home:  Take medicines only as directed by your health care provider.  Use bladder training techniques as directed. ? Keep a bladder diary to find out which foods, liquids, or activities make your symptoms worse. ? Use your bladder diary to schedule bathroom trips. If you are away from home, plan to be near a bathroom at each of your scheduled times. ? Make sure you urinate just before you leave the house and just before you go to bed.  Do Kegel exercises as directed by your health care provider.  Do not drink alcohol.  Do not use any tobacco products, including cigarettes, chewing tobacco, or electronic cigarettes. If you need help quitting, ask your health care provider.  Make dietary changes as directed by your health care provider. You may need to avoid spicy foods and foods that contain a high amount of potassium.  Limit your drinking of beverages that stimulate urination. These include soda, coffee, and tea.  Keep all follow-up visits as directed by your health care provider. This is important. Contact a health care provider if:  Your symptoms do not get better after treatment.  Your pain and  discomfort are getting worse.  You have more frequent urges to urinate.  You have a fever. Get help right away if:  You are not able to control your bladder at all. This information is not intended to replace advice given to you by your health care provider. Make sure you discuss any questions you have with your health care provider. Document Released: 09/04/2003 Document Revised: 06/11/2015 Document Reviewed: 09/10/2013 Elsevier Interactive Patient Education  2018 Elsevier Inc.  Pneumococcal Conjugate Vaccine suspension for injection What is this medicine? PNEUMOCOCCAL VACCINE (NEU mo KOK al vak SEEN) is a vaccine used to prevent pneumococcus bacterial infections. These bacteria can cause serious infections like pneumonia, meningitis, and blood infections. This vaccine will lower your chance of getting pneumonia. If you do get pneumonia, it can make your symptoms milder and your illness shorter. This vaccine will not treat an infection and will not cause infection. This vaccine is recommended for infants and young children, adults with certain medical conditions, and adults 65 years or older. This medicine may be used for other purposes; ask your health care provider or pharmacist if you have questions. COMMON BRAND NAME(S): Prevnar, Prevnar 13 What should I tell my health care provider before I take this medicine? They need to know if you have any of these conditions: -bleeding problems -fever -immune system problems -an unusual or allergic reaction to pneumococcal vaccine, diphtheria toxoid, other vaccines, latex, other medicines, foods, dyes, or preservatives -pregnant or trying to get pregnant -breast-feeding How should I use this medicine? This vaccine is for injection into a muscle. It is given by a health care professional. A copy of Vaccine Information Statements will be given before each vaccination. Read this sheet carefully each time. The sheet may change frequently. Talk to  your pediatrician regarding the use of this medicine in children. While this drug may be prescribed for children as young as 656 weeks old for selected conditions, precautions do apply. Overdosage: If you think you have taken too much of this medicine contact a poison control center or emergency room at once. NOTE: This medicine is only for you. Do not share this medicine with others. What if I miss a dose? It is important not to miss your dose. Call your doctor or health care professional if you are unable to keep an appointment. What may interact with this medicine? -medicines for cancer chemotherapy -medicines that suppress your immune function -steroid medicines like prednisone or cortisone This list may not describe all possible interactions. Give your health care provider a list of all the medicines, herbs, non-prescription drugs, or dietary supplements you use. Also tell them if you smoke, drink alcohol, or use illegal drugs. Some items may interact with your medicine. What should I watch for while using this medicine? Mild fever and pain should go away in 3 days or less. Report any unusual symptoms to your doctor or health care professional. What side effects may I notice from receiving this medicine? Side effects that you should report to your doctor or health care professional as soon  as possible: -allergic reactions like skin rash, itching or hives, swelling of the face, lips, or tongue -breathing problems -confused -fast or irregular heartbeat -fever over 102 degrees F -seizures -unusual bleeding or bruising -unusual muscle weakness Side effects that usually do not require medical attention (report to your doctor or health care professional if they continue or are bothersome): -aches and pains -diarrhea -fever of 102 degrees F or less -headache -irritable -loss of appetite -pain, tender at site where injected -trouble sleeping This list may not describe all possible side  effects. Call your doctor for medical advice about side effects. You may report side effects to FDA at 1-800-FDA-1088. Where should I keep my medicine? This does not apply. This vaccine is given in a clinic, pharmacy, doctor's office, or other health care setting and will not be stored at home. NOTE: This sheet is a summary. It may not cover all possible information. If you have questions about this medicine, talk to your doctor, pharmacist, or health care provider.  2018 Elsevier/Gold Standard (2013-10-10 10:27:27)

## 2017-09-13 NOTE — Progress Notes (Signed)
Chief Complaint  Patient presents with  . Follow-up   F/u doing well  1. HTN controlled today  2. Grief over loss of husband better  3. Declines DEXA today    Review of Systems  Constitutional: Negative for weight loss.  HENT: Negative for hearing loss.   Eyes: Negative for blurred vision.  Respiratory: Negative for shortness of breath.   Cardiovascular: Negative for chest pain.  Skin: Negative for rash.  Neurological: Negative for headaches.  Psychiatric/Behavioral: Negative for depression.   Past Medical History:  Diagnosis Date  . Allergy    i.e contact allergies [place] [facility]; also plants, trees   . Chicken pox   . Depression   . Family history of breast cancer    BRCA negative in the past  . History of prediabetes   . Hypertension   . Interstitial cystitis   . Interstitial cystitis   . Prediabetes    A1C 6.3 06/14/16  . Spinal stenosis   . UTI (urinary tract infection)   . UTI (urinary tract infection)    Past Surgical History:  Procedure Laterality Date  . denies     denies psuH   Family History  Problem Relation Age of Onset  . Breast cancer Mother 17       s/p b/l masectomy   . Breast cancer Sister 54  . Breast cancer Maternal Aunt   . Breast cancer Maternal Grandmother   . Stroke Father        age 64   . Stroke Other        grandmother age 49   . Lupus Other    Social History   Socioeconomic History  . Marital status: Married    Spouse name: Not on file  . Number of children: Not on file  . Years of education: Not on file  . Highest education level: Not on file  Occupational History  . Not on file  Social Needs  . Financial resource strain: Not on file  . Food insecurity:    Worry: Not on file    Inability: Not on file  . Transportation needs:    Medical: Not on file    Non-medical: Not on file  Tobacco Use  . Smoking status: Former Research scientist (life sciences)  . Smokeless tobacco: Never Used  . Tobacco comment: smoked 4-5 years in  college then quit  Substance and Sexual Activity  . Alcohol use: No    Frequency: Never  . Drug use: No  . Sexual activity: Not on file  Lifestyle  . Physical activity:    Days per week: Not on file    Minutes per session: Not on file  . Stress: Not on file  Relationships  . Social connections:    Talks on phone: Not on file    Gets together: Not on file    Attends religious service: Not on file    Active member of club or organization: Not on file    Attends meetings of clubs or organizations: Not on file    Relationship status: Not on file  . Intimate partner violence:    Fear of current or ex partner: Not on file    Emotionally abused: Not on file    Physically abused: Not on file    Forced sexual activity: Not on file  Other Topics Concern  . Not on file  Social History Narrative   1 daughter and 1 son    3 grandsons    Married  to high school sweet heart (husband) x 47 years dx'ed with prostate cancer in 2015-03-12 he died 2017-06-08    Retired Engineer, manufacturing education    Current Meds  Medication Sig  . Azelastine-Fluticasone 137-50 MCG/ACT SUSP Place 1 spray into the nose daily.  . cetirizine (ZYRTEC) 10 MG tablet Take by mouth.  . docusate sodium (COLACE) 100 MG capsule Take 100 mg by mouth daily.  Marland Kitchen FLUZONE HIGH-DOSE 0.5 ML injection TO BE ADMINISTERED BY PHARMACIST FOR IMMUNIZATION  . lisinopril-hydrochlorothiazide (PRINZIDE,ZESTORETIC) 20-12.5 MG tablet Take 1 tablet by mouth daily.  . montelukast (SINGULAIR) 10 MG tablet Take 1 tablet (10 mg total) by mouth at bedtime.  . nitrofurantoin, macrocrystal-monohydrate, (MACROBID) 100 MG capsule Take 1 capsule (100 mg total) by mouth 2 (two) times daily.  . Olopatadine HCl 0.2 % SOLN One drop to each eye daily  . traZODone (DESYREL) 100 MG tablet Take 1 tablet (100 mg total) by mouth at bedtime as needed for sleep.   Allergies  Allergen Reactions  . Benzalkonium Chloride Rash  . Cetyl Alcohol Rash  . Codeine Rash  .  Lanolin Rash  . Neomycin Rash  . Nickel Rash  . Propylene Glycol Rash   Recent Results (from the past 03-11-58 hour(s))  Urinalysis, Routine w reflex microscopic     Status: Abnormal   Collection Time: 08/04/17 11:05 AM  Result Value Ref Range   Specific Gravity, UA 1.014 1.005 - 1.030   pH, UA 5.0 5.0 - 7.5   Color, UA Yellow Yellow   Appearance Ur Clear Clear   Leukocytes, UA 2+ (A) Negative   Protein, UA Negative Negative/Trace   Glucose, UA Negative Negative   Ketones, UA Negative Negative   RBC, UA Negative Negative   Bilirubin, UA Negative Negative   Urobilinogen, Ur 0.2 0.2 - 1.0 mg/dL   Nitrite, UA Negative Negative   Microscopic Examination See below:     Comment: Microscopic was indicated and was performed.  Urine Culture     Status: Abnormal   Collection Time: 08/04/17 11:05 AM  Result Value Ref Range   Urine Culture, Routine Final report (A)    Organism ID, Bacteria Klebsiella pneumoniae (A)     Comment: Greater than 100,000 colony forming units per mL Cefazolin <=4 ug/mL Cefazolin with an MIC <=16 predicts susceptibility to the oral agents cefaclor, cefdinir, cefpodoxime, cefprozil, cefuroxime, cephalexin, and loracarbef when used for therapy of uncomplicated urinary tract infections due to E. coli, Klebsiella pneumoniae, and Proteus mirabilis.    Antimicrobial Susceptibility Comment     Comment:       ** S = Susceptible; I = Intermediate; R = Resistant **                    P = Positive; N = Negative             MICS are expressed in micrograms per mL    Antibiotic                 RSLT#1    RSLT#2    RSLT#3    RSLT#4 Amoxicillin/Clavulanic Acid    S Ampicillin                     R Cefepime                       S Ceftriaxone  S Cefuroxime                     S Ciprofloxacin                  S Ertapenem                      S Gentamicin                     S Imipenem                       S Levofloxacin                   S Meropenem                       S Nitrofurantoin                 R Piperacillin/Tazobactam        S Tetracycline                   S Tobramycin                     S Trimethoprim/Sulfa             S   Microscopic Examination     Status: Abnormal   Collection Time: 08/04/17 11:05 AM  Result Value Ref Range   WBC, UA >30 (A) 0 - 5 /hpf   RBC, UA 0-2 0 - 2 /hpf   Epithelial Cells (non renal) 0-10 0 - 10 /hpf   Casts None seen None seen /lpf   Mucus, UA Present Not Estab.   Bacteria, UA Many (A) None seen/Few   Objective  Body mass index is 31.41 kg/m. Wt Readings from Last 3 Encounters:  09/13/17 183 lb (83 kg)  08/04/17 185 lb (83.9 kg)  06/19/17 184 lb 12.8 oz (83.8 kg)   Temp Readings from Last 3 Encounters:  09/13/17 98.6 F (37 C) (Oral)  08/04/17 98 F (36.7 C) (Oral)  06/19/17 98.8 F (37.1 C) (Oral)   BP Readings from Last 3 Encounters:  09/13/17 106/62  08/04/17 (!) 144/80  06/19/17 126/64   Pulse Readings from Last 3 Encounters:  09/13/17 67  08/04/17 75  06/19/17 74    Physical Exam  Constitutional: She is oriented to person, place, and time. Vital signs are normal. She appears well-developed and well-nourished. She is cooperative.  HENT:  Head: Normocephalic and atraumatic.  Mouth/Throat: Oropharynx is clear and moist.  Eyes: Pupils are equal, round, and reactive to light. Conjunctivae are normal.  Cardiovascular: Normal rate, regular rhythm and normal heart sounds.  Pulmonary/Chest: Effort normal and breath sounds normal.  Neurological: She is alert and oriented to person, place, and time. Gait normal.  Skin: Skin is warm, dry and intact.  Psychiatric: She has a normal mood and affect. Her speech is normal and behavior is normal. Judgment and thought content normal. Cognition and memory are normal.  Nursing note and vitals reviewed.   Assessment   1. HTN  2. HM 3. interstitial cystitis h/o UTI  Plan   1.  sch fasting labs  2.  Will get flu shot  Tdap had  07/22/15 pna 23 11/20/14 prevnar today Hep B 3/3 shots   Last pap 07/22/15 negative HPV neg  Mammogram 07/07/17 negative Pt wants to  hold on dexa fo rnow  cologaurd neg 12/30/16  F/u [place] [facility] had bx 2019 neg f/u in 1 year   3. rec stop macrobid and try D mannose    Provider: Dr. Olivia Mackie McLean-Scocuzza-Internal Medicine

## 2017-09-13 NOTE — Progress Notes (Signed)
Pre visit review using our clinic review tool, if applicable. No additional management support is needed unless otherwise documented below in the visit note. 

## 2017-12-21 ENCOUNTER — Other Ambulatory Visit: Payer: Self-pay | Admitting: Internal Medicine

## 2017-12-21 DIAGNOSIS — G4700 Insomnia, unspecified: Secondary | ICD-10-CM

## 2017-12-21 MED ORDER — TRAZODONE HCL 100 MG PO TABS: 100.0000 mg | ORAL_TABLET | Freq: Every evening | ORAL | 1 refills | Status: DC | PRN

## 2017-12-22 ENCOUNTER — Other Ambulatory Visit: Payer: Self-pay | Admitting: Internal Medicine

## 2017-12-22 DIAGNOSIS — I10 Essential (primary) hypertension: Secondary | ICD-10-CM

## 2017-12-22 MED ORDER — LISINOPRIL-HYDROCHLOROTHIAZIDE 20-12.5 MG PO TABS: 1.0000 | ORAL_TABLET | Freq: Every day | ORAL | 3 refills | Status: DC

## 2017-12-25 ENCOUNTER — Other Ambulatory Visit (INDEPENDENT_AMBULATORY_CARE_PROVIDER_SITE_OTHER): Payer: Medicare Other

## 2017-12-25 DIAGNOSIS — Z1159 Encounter for screening for other viral diseases: Secondary | ICD-10-CM

## 2017-12-25 DIAGNOSIS — Z1329 Encounter for screening for other suspected endocrine disorder: Secondary | ICD-10-CM | POA: Diagnosis not present

## 2017-12-25 DIAGNOSIS — E559 Vitamin D deficiency, unspecified: Secondary | ICD-10-CM | POA: Diagnosis not present

## 2017-12-25 DIAGNOSIS — I10 Essential (primary) hypertension: Secondary | ICD-10-CM | POA: Diagnosis not present

## 2017-12-25 DIAGNOSIS — Z0184 Encounter for antibody response examination: Secondary | ICD-10-CM

## 2017-12-25 DIAGNOSIS — Z1389 Encounter for screening for other disorder: Secondary | ICD-10-CM

## 2017-12-25 LAB — LIPID PANEL
CHOL/HDL RATIO: 3
Cholesterol: 161 mg/dL (ref 0–200)
HDL: 51.1 mg/dL (ref >39.00)
LDL CALC: 85 mg/dL (ref 0–99)
NonHDL: 109.61
Triglycerides: 125 mg/dL (ref 0.0–149.0)
VLDL: 25 mg/dL (ref 0.0–40.0)

## 2017-12-25 LAB — COMPREHENSIVE METABOLIC PANEL
ALT: 19 U/L (ref 0–35)
AST: 20 U/L (ref 0–37)
Albumin: 4.3 g/dL (ref 3.5–5.2)
Alkaline Phosphatase: 59 U/L (ref 39–117)
BUN: 14 mg/dL (ref 6–23)
CO2: 27 mEq/L (ref 19–32)
CREATININE: 0.83 mg/dL (ref 0.40–1.20)
Calcium: 9.5 mg/dL (ref 8.4–10.5)
Chloride: 102 mEq/L (ref 96–112)
GFR: 72.35 mL/min (ref >60.00)
Glucose, Bld: 112 mg/dL — ABNORMAL HIGH (ref 70–99)
Potassium: 3.8 mEq/L (ref 3.5–5.1)
SODIUM: 139 meq/L (ref 135–145)
Total Bilirubin: 0.7 mg/dL (ref 0.2–1.2)
Total Protein: 7.2 g/dL (ref 6.0–8.3)

## 2017-12-25 LAB — CBC WITH DIFFERENTIAL/PLATELET
BASOS ABS: 0.1 10*3/uL (ref 0.0–0.1)
Basophils Relative: 0.9 % (ref 0.0–3.0)
EOS ABS: 0.1 10*3/uL (ref 0.0–0.7)
Eosinophils Relative: 1.8 % (ref 0.0–5.0)
HCT: 38.7 % (ref 36.0–46.0)
HEMOGLOBIN: 13.1 g/dL (ref 12.0–15.0)
LYMPHS PCT: 21.3 % (ref 12.0–46.0)
Lymphs Abs: 1.2 10*3/uL (ref 0.7–4.0)
MCHC: 33.8 g/dL (ref 30.0–36.0)
MCV: 85.2 fl (ref 78.0–100.0)
Monocytes Absolute: 0.5 10*3/uL (ref 0.1–1.0)
Monocytes Relative: 8.3 % (ref 3.0–12.0)
Neutro Abs: 3.7 10*3/uL (ref 1.4–7.7)
Neutrophils Relative %: 67.7 % (ref 43.0–77.0)
Platelets: 289 10*3/uL (ref 150.0–400.0)
RBC: 4.55 Mil/uL (ref 3.87–5.11)
RDW: 12.9 % (ref 11.5–15.5)
WBC: 5.4 10*3/uL (ref 4.0–10.5)

## 2017-12-25 LAB — TSH: TSH: 2.69 u[IU]/mL (ref 0.35–4.50)

## 2017-12-25 LAB — VITAMIN D 25 HYDROXY (VIT D DEFICIENCY, FRACTURES): VITD: 22.18 ng/mL — ABNORMAL LOW (ref 30.00–100.00)

## 2017-12-25 NOTE — Addendum Note (Signed)
Addended by: Warden FillersWRIGHT, LATOYA S on: 12/25/2017 09:08 AM   Modules accepted: Orders

## 2017-12-26 ENCOUNTER — Encounter: Payer: Self-pay | Admitting: Internal Medicine

## 2017-12-26 ENCOUNTER — Other Ambulatory Visit: Payer: Self-pay | Admitting: Internal Medicine

## 2017-12-26 DIAGNOSIS — E559 Vitamin D deficiency, unspecified: Secondary | ICD-10-CM | POA: Insufficient documentation

## 2017-12-26 DIAGNOSIS — R739 Hyperglycemia, unspecified: Secondary | ICD-10-CM

## 2017-12-26 LAB — HEPATITIS B SURFACE ANTIBODY, QUANTITATIVE: HEPATITIS B-POST: 39 m[IU]/mL (ref >=10)

## 2017-12-26 LAB — MEASLES/MUMPS/RUBELLA IMMUNITY
Mumps IgG: >300 AU/mL
RUBELLA: 22.5 {index}
Rubeola IgG: >300 AU/mL

## 2017-12-26 LAB — URINALYSIS, ROUTINE W REFLEX MICROSCOPIC
Bilirubin, UA: NEGATIVE
Glucose, UA: NEGATIVE
Ketones, UA: NEGATIVE
Leukocytes, UA: NEGATIVE
Nitrite, UA: NEGATIVE
Protein, UA: NEGATIVE
RBC, UA: NEGATIVE
Specific Gravity, UA: 1.016 (ref 1.005–1.030)
Urobilinogen, Ur: 0.2 mg/dL (ref 0.2–1.0)
pH, UA: 6 (ref 5.0–7.5)

## 2017-12-27 ENCOUNTER — Other Ambulatory Visit (INDEPENDENT_AMBULATORY_CARE_PROVIDER_SITE_OTHER): Payer: Medicare Other

## 2017-12-27 DIAGNOSIS — R739 Hyperglycemia, unspecified: Secondary | ICD-10-CM | POA: Diagnosis not present

## 2017-12-27 LAB — HEMOGLOBIN A1C: HEMOGLOBIN A1C: 6.1 % (ref 4.6–6.5)

## 2018-02-28 ENCOUNTER — Ambulatory Visit: Payer: Self-pay | Admitting: *Deleted

## 2018-02-28 ENCOUNTER — Other Ambulatory Visit: Payer: Self-pay | Admitting: Internal Medicine

## 2018-02-28 DIAGNOSIS — N3000 Acute cystitis without hematuria: Secondary | ICD-10-CM

## 2018-02-28 NOTE — Telephone Encounter (Signed)
Contacted pt regarding symptoms; she said that starting 02/25/2018: urinary frequency and burning (especially after emptying bladder); the pt says that she started( nitrofurantoin,  macrocrystal- monohydrate, (MACROBID) 100 MG capsule )  100 mg 2 capsules at bedtime x 3 days (2/9-2/11 ) per previous instructions from Dr Judie Grieve; she says that her urine appears dark, and has a stronger odor; recommendations made per nurse triage protocol; pt previously scheduled to see Dr Judie Grieve, [facility], 03/01/2018 at 1000; offered to make pt an appointment to be seen sooner by another provider but she declined; she verbalized understanding; will route to office for notification.  Reason for Disposition . Urinating more frequently than usual (i.e., frequency)  Answer Assessment - Initial Assessment Questions 1. SYMPTOM: "What's the main symptom you're concerned about?" (e.g., frequency, incontinence)     Frequency and burning 2. ONSET: "When did the  start?"     02/25/2018 3. PAIN: "Is there any pain?" If so, ask: "How bad is it?" (Scale: 1-10; mild, moderate, severe)     Moderate when urinating 4. CAUSE: "What do you think is causing the symptoms?"     Recurrent UTI 5. OTHER SYMPTOMS: "Do you have any other symptoms?" (e.g., fever, flank pain, blood in urine, pain with urination)     Frequency, dark urine, strong odor 6. PREGNANCY: "Is there any chance you are pregnant?" "When was your last menstrual period?"     no  Protocols used: URINARY [facility]

## 2018-02-28 NOTE — Telephone Encounter (Signed)
Will check urine tomorrow with office visit urine labs ordered   TMS

## 2018-03-01 ENCOUNTER — Ambulatory Visit: Payer: Medicare Other | Admitting: Internal Medicine

## 2018-03-01 ENCOUNTER — Encounter: Payer: Self-pay | Admitting: Internal Medicine

## 2018-03-01 VITALS — BP 132/60 | HR 81 | Temp 98.6°F | Ht 64.0 in | Wt 181.8 lb

## 2018-03-01 DIAGNOSIS — I10 Essential (primary) hypertension: Secondary | ICD-10-CM

## 2018-03-01 DIAGNOSIS — N3010 Interstitial cystitis (chronic) without hematuria: Secondary | ICD-10-CM | POA: Diagnosis not present

## 2018-03-01 DIAGNOSIS — Z1231 Encounter for screening mammogram for malignant neoplasm of breast: Secondary | ICD-10-CM | POA: Diagnosis not present

## 2018-03-01 DIAGNOSIS — N3000 Acute cystitis without hematuria: Secondary | ICD-10-CM | POA: Diagnosis not present

## 2018-03-01 MED ORDER — CIPROFLOXACIN HCL 500 MG PO TABS: 500.0000 mg | ORAL_TABLET | Freq: Two times a day (BID) | ORAL | 0 refills | Status: DC

## 2018-03-01 NOTE — Progress Notes (Signed)
Pre visit review using our clinic review tool, if applicable. No additional management support is needed unless otherwise documented below in the visit note. 

## 2018-03-01 NOTE — Patient Instructions (Addendum)
D mannose supplement  Azo with pyridium x 2-3 days over the counter only helps with burning    Urinary Tract Infection, Adult  A urinary tract infection (UTI) is an infection of any part of the urinary tract. The urinary tract includes the kidneys, ureters, bladder, and urethra. These organs make, store, and get rid of urine in the body. Your health care provider may use other names to describe the infection. An upper UTI affects the ureters and kidneys (pyelonephritis). A lower UTI affects the bladder (cystitis) and urethra (urethritis). What are the causes? Most urinary tract infections are caused by bacteria in your genital area, around the entrance to your urinary tract (urethra). These bacteria grow and cause inflammation of your urinary tract. What increases the risk? You are more likely to develop this condition if:  You have a urinary catheter that stays in place (indwelling).  You are not able to control when you urinate or have a bowel movement (you have incontinence).  You are female and you: ? Use a spermicide or diaphragm for birth control. ? Have low estrogen levels. ? Are pregnant.  You have certain genes that increase your risk (genetics).  You are sexually active.  You take antibiotic medicines.  You have a condition that causes your flow of urine to slow down, such as: ? An enlarged prostate, if you are female. ? Blockage in your urethra (stricture). ? A kidney stone. ? A nerve condition that affects your bladder control (neurogenic bladder). ? Not getting enough to drink, or not urinating often.  You have certain medical conditions, such as: ? Diabetes. ? A weak disease-fighting system (immunesystem). ? Sickle cell disease. ? Gout. ? Spinal cord injury. What are the signs or symptoms? Symptoms of this condition include:  Needing to urinate right away (urgently).  Frequent urination or passing small amounts of urine frequently.  Pain or burning with  urination.  Blood in the urine.  Urine that smells bad or unusual.  Trouble urinating.  Cloudy urine.  Vaginal discharge, if you are female.  Pain in the abdomen or the lower back. You may also have:  Vomiting or a decreased appetite.  Confusion.  Irritability or tiredness.  A fever.  Diarrhea. The first symptom in older adults may be confusion. In some cases, they may not have any symptoms until the infection has worsened. How is this diagnosed? This condition is diagnosed based on your medical history and a physical exam. You may also have other tests, including:  Urine tests.  Blood tests.  Tests for sexually transmitted infections (STIs). If you have had more than one UTI, a cystoscopy or imaging studies may be done to determine the cause of the infections. How is this treated? Treatment for this condition includes:  Antibiotic medicine.  Over-the-counter medicines to treat discomfort.  Drinking enough water to stay hydrated. If you have frequent infections or have other conditions such as a kidney stone, you may need to see a health care provider who specializes in the urinary tract (urologist). In rare cases, urinary tract infections can cause sepsis. Sepsis is a life-threatening condition that occurs when the body responds to an infection. Sepsis is treated in the hospital with IV antibiotics, fluids, and other medicines. Follow these instructions at home:  Medicines  Take over-the-counter and prescription medicines only as told by your health care provider.  If you were prescribed an antibiotic medicine, take it as told by your health care provider. Do not stop using the  antibiotic even if you start to feel better. General instructions  Make sure you: ? Empty your bladder often and completely. Do not hold urine for long periods of time. ? Empty your bladder after sex. ? Wipe from front to back after a bowel movement if you are female. Use each tissue  one time when you wipe.  Drink enough fluid to keep your urine pale yellow.  Keep all follow-up visits as told by your health care provider. This is important. Contact a health care provider if:  Your symptoms do not get better after 1-2 days.  Your symptoms go away and then return. Get help right away if you have:  Severe pain in your back or your lower abdomen.  A fever.  Nausea or vomiting. Summary  A urinary tract infection (UTI) is an infection of any part of the urinary tract, which includes the kidneys, ureters, bladder, and urethra.  Most urinary tract infections are caused by bacteria in your genital area, around the entrance to your urinary tract (urethra).  Treatment for this condition often includes antibiotic medicines.  If you were prescribed an antibiotic medicine, take it as told by your health care provider. Do not stop using the antibiotic even if you start to feel better.  Keep all follow-up visits as told by your health care provider. This is important. This information is not intended to replace advice given to you by your health care provider. Make sure you discuss any questions you have with your health care provider. Document Released: 10/13/2004 Document Revised: 07/13/2017 Document Reviewed: 07/13/2017 Elsevier Interactive Patient Education  2019 [employer].

## 2018-03-01 NOTE — Progress Notes (Signed)
Chief Complaint  Patient presents with  . Urinary Tract Infection   F/u  1. UTI c/o urgency and frequency since Sunday tried otc medication for burning with urination h/o UTI last 08/04/17 klebsiella    Review of Systems  Constitutional: Negative for weight loss.  HENT: Negative for hearing loss.   Eyes: Negative for blurred vision.  Respiratory: Negative for shortness of breath.   Cardiovascular: Negative for chest pain.  Gastrointestinal: Negative for abdominal pain.  Genitourinary: Positive for dysuria and urgency.  Skin: Negative for rash.  Neurological: Negative for headaches.  Psychiatric/Behavioral: Negative for depression.   Past Medical History:  Diagnosis Date  . Allergy    i.e contact allergies [place] [facility]; also plants, trees   . Chicken pox   . Depression   . Family history of breast cancer    BRCA negative in the past  . History of prediabetes   . Hypertension   . Interstitial cystitis   . Interstitial cystitis   . Prediabetes    A1C 6.3 06/14/16  . Spinal stenosis   . UTI (urinary tract infection)   . UTI (urinary tract infection)    Past Surgical History:  Procedure Laterality Date  . denies     denies psuH   Family History  Problem Relation Age of Onset  . Breast cancer Mother 80       s/p b/l masectomy   . Breast cancer Sister 48  . Breast cancer Maternal Aunt   . Breast cancer Maternal Grandmother   . Stroke Father        age 44   . Stroke Other        grandmother age 64   . Lupus Other    Social History   Socioeconomic History  . Marital status: Married    Spouse name: Not on file  . Number of children: Not on file  . Years of education: Not on file  . Highest education level: Not on file  Occupational History  . Not on file  Social Needs  . Financial resource strain: Not on file  . Food insecurity:    Worry: Not on file    Inability: Not on file  . Transportation needs:    Medical: Not on file    Non-medical:  Not on file  Tobacco Use  . Smoking status: Former Research scientist (life sciences)  . Smokeless tobacco: Never Used  . Tobacco comment: smoked 4-5 years in college then quit  Substance and Sexual Activity  . Alcohol use: No    Frequency: Never  . Drug use: No  . Sexual activity: Not on file  Lifestyle  . Physical activity:    Days per week: Not on file    Minutes per session: Not on file  . Stress: Not on file  Relationships  . Social connections:    Talks on phone: Not on file    Gets together: Not on file    Attends religious service: Not on file    Active member of club or organization: Not on file    Attends meetings of clubs or organizations: Not on file    Relationship status: Not on file  . Intimate partner violence:    Fear of current or ex partner: Not on file    Emotionally abused: Not on file    Physically abused: Not on file    Forced sexual activity: Not on file  Other Topics Concern  . Not on file  Social History Narrative  1 daughter and 1 son    3 grandsons    Married to high school sweet heart (husband) x 30 years dx'ed with prostate cancer in 04/02/2015 he died 2017/06/01    Retired Engineer, manufacturing education    Current Meds  Medication Sig  . Azelastine-Fluticasone 137-50 MCG/ACT SUSP Place 1 spray into the nose daily.  . cetirizine (ZYRTEC) 10 MG tablet Take by mouth.  . Cholecalciferol (VITAMIN D3 PO) Take 5,000 Units by mouth.  . docusate sodium (COLACE) 100 MG capsule Take 100 mg by mouth daily.  Marland Kitchen FLUZONE HIGH-DOSE 0.5 ML injection TO BE ADMINISTERED BY PHARMACIST FOR IMMUNIZATION  . lisinopril-hydrochlorothiazide (PRINZIDE,ZESTORETIC) 20-12.5 MG tablet Take 1 tablet by mouth daily.  . montelukast (SINGULAIR) 10 MG tablet Take 1 tablet (10 mg total) by mouth at bedtime.  . Olopatadine HCl 0.2 % SOLN One drop to each eye daily  . traZODone (DESYREL) 100 MG tablet Take 1 tablet (100 mg total) by mouth at bedtime as needed for sleep.   Allergies  Allergen Reactions  .  Benzalkonium Chloride Rash  . Cetyl Alcohol Rash  . Codeine Rash  . Lanolin Rash  . Neomycin Rash  . Nickel Rash  . Propylene Glycol Rash   Recent Results (from the past 04-02-58 hour(s))  Vitamin D (25 hydroxy)     Status: Abnormal   Collection Time: 12/25/17  9:08 AM  Result Value Ref Range   VITD 22.18 (L) 30.00 - 100.00 ng/mL  Measles/Mumps/Rubella Immunity     Status: None   Collection Time: 12/25/17  9:08 AM  Result Value Ref Range   Rubeola IgG >300.00 AU/mL    Comment: AU/mL            Interpretation -----            -------------- <13.50           Negative 13.50-16.49      Equivocal >16.49           Positive . A positive result indicates that the patient has antibody to measles virus. It does not differentiate  between an active or past infection. The clinical  diagnosis must be interpreted in conjunction with  clinical signs and symptoms of the patient.    Mumps IgG >300.00 AU/mL    Comment:  AU/mL           Interpretation -------         ---------------- <9.00             Negative 9.00-10.99        Equivocal >10.99            Positive A positive result indicates that the patient has  antibody to mumps virus. It does not differentiate between an  active or past infection. The clinical diagnosis must be interpreted in conjunction with clinical signs and symptoms of the patient. .    Rubella 22.50 index    Comment:     Index            Interpretation     -----            --------------       <0.90            Not consistent with Immunity     0.90-0.99        Equivocal     > or = 1.00      Consistent with Immunity  . The presence of rubella IgG  antibody suggests  immunization or past or current infection with rubella virus.   Hepatitis B surface antibody,quantitative     Status: None   Collection Time: 12/25/17  9:08 AM  Result Value Ref Range   Hepatitis B-Post 39 > OR = 10 mIU/mL    Comment: . Patient has immunity to hepatitis B virus. . For additional  information, please refer to http://education.[facility].com/faq/FAQ105 (This link is being provided for informational/ educational purposes only).   TSH     Status: None   Collection Time: 12/25/17  9:08 AM  Result Value Ref Range   TSH 2.69 0.35 - 4.50 uIU/mL  Lipid panel     Status: None   Collection Time: 12/25/17  9:08 AM  Result Value Ref Range   Cholesterol 161 0 - 200 mg/dL    Comment: ATP III Classification       Desirable:  < 200 mg/dL               Borderline High:  200 - 239 mg/dL          High:  > = 240 mg/dL   Triglycerides 125.0 0.0 - 149.0 mg/dL    Comment: Normal:  <150 mg/dLBorderline High:  150 - 199 mg/dL   HDL 51.10 >39.00 mg/dL   VLDL 25.0 0.0 - 40.0 mg/dL   LDL Cholesterol 85 0 - 99 mg/dL   Total CHOL/HDL Ratio 3     Comment:                Men          Women1/2 Average Risk     3.4          3.3Average Risk          5.0          4.42X Average Risk          9.6          7.13X Average Risk          15.0          11.0                       NonHDL 109.61     Comment: NOTE:  Non-HDL goal should be 30 mg/dL higher than patient's LDL goal (i.e. LDL goal of < 70 mg/dL, would have non-HDL goal of < 100 mg/dL)  CBC with Differential/Platelet     Status: None   Collection Time: 12/25/17  9:08 AM  Result Value Ref Range   WBC 5.4 4.0 - 10.5 K/uL   RBC 4.55 3.87 - 5.11 Mil/uL   Hemoglobin 13.1 12.0 - 15.0 g/dL   HCT 38.7 36.0 - 46.0 %   MCV 85.2 78.0 - 100.0 fl   MCHC 33.8 30.0 - 36.0 g/dL   RDW 12.9 11.5 - 15.5 %   Platelets 289.0 150.0 - 400.0 K/uL   Neutrophils Relative % 67.7 43.0 - 77.0 %   Lymphocytes Relative 21.3 12.0 - 46.0 %   Monocytes Relative 8.3 3.0 - 12.0 %   Eosinophils Relative 1.8 0.0 - 5.0 %   Basophils Relative 0.9 0.0 - 3.0 %   Neutro Abs 3.7 1.4 - 7.7 K/uL   Lymphs Abs 1.2 0.7 - 4.0 K/uL   Monocytes Absolute 0.5 0.1 - 1.0 K/uL   Eosinophils Absolute 0.1 0.0 - 0.7 K/uL   Basophils Absolute 0.1 0.0 - 0.1 K/uL  Comprehensive metabolic  panel  Status: Abnormal   Collection Time: 12/25/17  9:08 AM  Result Value Ref Range   Sodium 139 135 - 145 mEq/L   Potassium 3.8 3.5 - 5.1 mEq/L   Chloride 102 96 - 112 mEq/L   CO2 27 19 - 32 mEq/L   Glucose, Bld 112 (H) 70 - 99 mg/dL   BUN 14 6 - 23 mg/dL   Creatinine, Ser 0.83 0.40 - 1.20 mg/dL   Total Bilirubin 0.7 0.2 - 1.2 mg/dL   Alkaline Phosphatase 59 39 - 117 U/L   AST 20 0 - 37 U/L   ALT 19 0 - 35 U/L   Total Protein 7.2 6.0 - 8.3 g/dL   Albumin 4.3 3.5 - 5.2 g/dL   Calcium 9.5 8.4 - 10.5 mg/dL   GFR 72.35 >60.00 mL/min  Urinalysis, Routine w reflex microscopic     Status: None   Collection Time: 12/25/17  9:08 AM  Result Value Ref Range   Specific Gravity, UA 1.016 1.005 - 1.030   pH, UA 6.0 5.0 - 7.5   Color, UA Yellow Yellow   Appearance Ur Clear Clear   Leukocytes, UA Negative Negative   Protein, UA Negative Negative/Trace   Glucose, UA Negative Negative   Ketones, UA Negative Negative   RBC, UA Negative Negative   Bilirubin, UA Negative Negative   Urobilinogen, Ur 0.2 0.2 - 1.0 mg/dL   Nitrite, UA Negative Negative   Microscopic Examination Comment     Comment: Microscopic not indicated and not performed.  Hemoglobin A1c     Status: None   Collection Time: 12/27/17 11:56 AM  Result Value Ref Range   Hgb A1c MFr Bld 6.1 4.6 - 6.5 %    Comment: Glycemic Control Guidelines for People with Diabetes:Non Diabetic:  <6%Goal of Therapy: <7%Additional Action Suggested:  >8%    Objective  Body mass index is 31.21 kg/m. Wt Readings from Last 3 Encounters:  03/01/18 181 lb 12.8 oz (82.5 kg)  09/13/17 183 lb (83 kg)  08/04/17 185 lb (83.9 kg)   Temp Readings from Last 3 Encounters:  03/01/18 98.6 F (37 C) (Oral)  09/13/17 98.6 F (37 C) (Oral)  08/04/17 98 F (36.7 C) (Oral)   BP Readings from Last 3 Encounters:  03/01/18 132/60  09/13/17 106/62  08/04/17 (!) 144/80   Pulse Readings from Last 3 Encounters:  03/01/18 81  09/13/17 67  08/04/17 75     Physical Exam Vitals signs and nursing note reviewed.  Constitutional:      Appearance: Normal appearance. She is well-developed and well-groomed.  HENT:     Head: Normocephalic and atraumatic.     Mouth/Throat:     Mouth: Mucous membranes are moist.     Pharynx: Oropharynx is clear.  Eyes:     Conjunctiva/sclera: Conjunctivae normal.     Pupils: Pupils are equal, round, and reactive to light.  Cardiovascular:     Rate and Rhythm: Normal rate and regular rhythm.     Heart sounds: Normal heart sounds.  Pulmonary:     Effort: Pulmonary effort is normal.     Breath sounds: Normal breath sounds.  Abdominal:     Tenderness: There is no right CVA tenderness or left CVA tenderness.  Skin:    General: Skin is warm and dry.  Neurological:     General: No focal deficit present.     Mental Status: She is alert and oriented to person, place, and time. Mental status is at baseline.  Gait: Gait normal.  Psychiatric:        Attention and Perception: Attention and perception normal.        Mood and Affect: Mood and affect normal.        Speech: Speech normal.        Behavior: Behavior normal. Behavior is cooperative.        Thought Content: Thought content normal.        Cognition and Memory: Cognition and memory normal.        Judgment: Judgment normal.     Assessment   1. Recurrent UTI 2. HTN 3.HM Plan   1. cipro bid x 5 days  UA and culture 03/07/18 repeat  If sx's continue refer urology  2. Cont meds  3.  Will get flu shot  Tdap had 07/22/15 pna 23 11/20/14 prevnar utd Hep B immune, MMR immune D3 5000 IU daily   Last pap 07/22/15 negative HPV neg  Mammogram 07/07/17 negative referred today  Pt wants to hold on dexa fo rnow cologaurdneg 12/30/16  [facility] [place] [facility] f/u in 1 year   Of note sister in law died going to funeral this weekend Provider: Dr. Olivia Mackie McLean-Scocuzza-[facility]

## 2018-03-07 ENCOUNTER — Other Ambulatory Visit: Payer: Medicare Other

## 2018-03-07 DIAGNOSIS — N3000 Acute cystitis without hematuria: Secondary | ICD-10-CM

## 2018-03-07 NOTE — Addendum Note (Signed)
Addended by: Penne Lash on: 03/07/2018 02:52 PM   Modules accepted: Orders

## 2018-03-08 LAB — URINALYSIS, ROUTINE W REFLEX MICROSCOPIC
Bilirubin, UA: NEGATIVE
Glucose, UA: NEGATIVE
Ketones, UA: NEGATIVE
Leukocytes, UA: NEGATIVE
Nitrite, UA: NEGATIVE
Protein, UA: NEGATIVE
RBC, UA: NEGATIVE
Specific Gravity, UA: 1.013 (ref 1.005–1.030)
Urobilinogen, Ur: 0.2 mg/dL (ref 0.2–1.0)
pH, UA: 6 (ref 5.0–7.5)

## 2018-03-09 LAB — URINE CULTURE: Organism ID, Bacteria: NO GROWTH

## 2018-03-16 ENCOUNTER — Ambulatory Visit: Payer: Self-pay | Admitting: Internal Medicine

## 2018-04-02 ENCOUNTER — Encounter: Payer: Self-pay | Admitting: Internal Medicine

## 2018-05-09 ENCOUNTER — Other Ambulatory Visit: Payer: Self-pay | Admitting: Internal Medicine

## 2018-05-09 DIAGNOSIS — J309 Allergic rhinitis, unspecified: Secondary | ICD-10-CM

## 2018-05-09 MED ORDER — MONTELUKAST SODIUM 10 MG PO TABS: 10.0000 mg | ORAL_TABLET | Freq: Every day | ORAL | 0 refills | Status: DC

## 2018-05-09 NOTE — Telephone Encounter (Signed)
Requested Prescriptions  Pending Prescriptions Disp Refills  . montelukast (SINGULAIR) 10 MG tablet 90 tablet 0    Sig: Take 1 tablet (10 mg total) by mouth at bedtime.     Pulmonology:  Leukotriene Inhibitors Passed - 05/09/2018  9:48 AM      Passed - Valid encounter within last 12 months    Recent Outpatient Visits          2 months ago Essential hypertension   [facility] McLean-Scocuzza, Pasty Spillers, MD   7 months ago Essential hypertension   [facility] McLean-Scocuzza, Pasty Spillers, MD   9 months ago Acute cystitis without hematuria   [facility] [place] McLean-Scocuzza, Pasty Spillers, MD   10 months ago Grief   [facility] McLean-Scocuzza, Pasty Spillers, MD   1 year ago Allergic rhinitis, unspecified seasonality, unspecified trigger   [facility] Fredric Dine, FNP      Future Appointments            In 4 months McLean-Scocuzza, Pasty Spillers, MD [facility], [facility]

## 2018-07-23 ENCOUNTER — Other Ambulatory Visit: Payer: Self-pay

## 2018-07-23 ENCOUNTER — Ambulatory Visit
Admission: RE | Admit: 2018-07-23 | Discharge: 2018-07-23 | Disposition: A | Discharge status: Home / Self Care | Payer: Medicare Other | Source: Ambulatory Visit | Attending: Internal Medicine | Admitting: Internal Medicine

## 2018-07-23 DIAGNOSIS — Z1231 Encounter for screening mammogram for malignant neoplasm of breast: Secondary | ICD-10-CM | POA: Diagnosis not present

## 2018-07-23 IMAGING — MG DIGITAL SCREENING BILATERAL MAMMOGRAM WITH TOMO AND CAD
6 of 11 series · 6 of 35 positions shown · non-contrast
Comparison: Previous exam(s).

CLINICAL DATA: Screening.

EXAM:
DIGITAL SCREENING BILATERAL MAMMOGRAM WITH TOMO AND CAD

[R MLO synth-2D (1 of 2)]
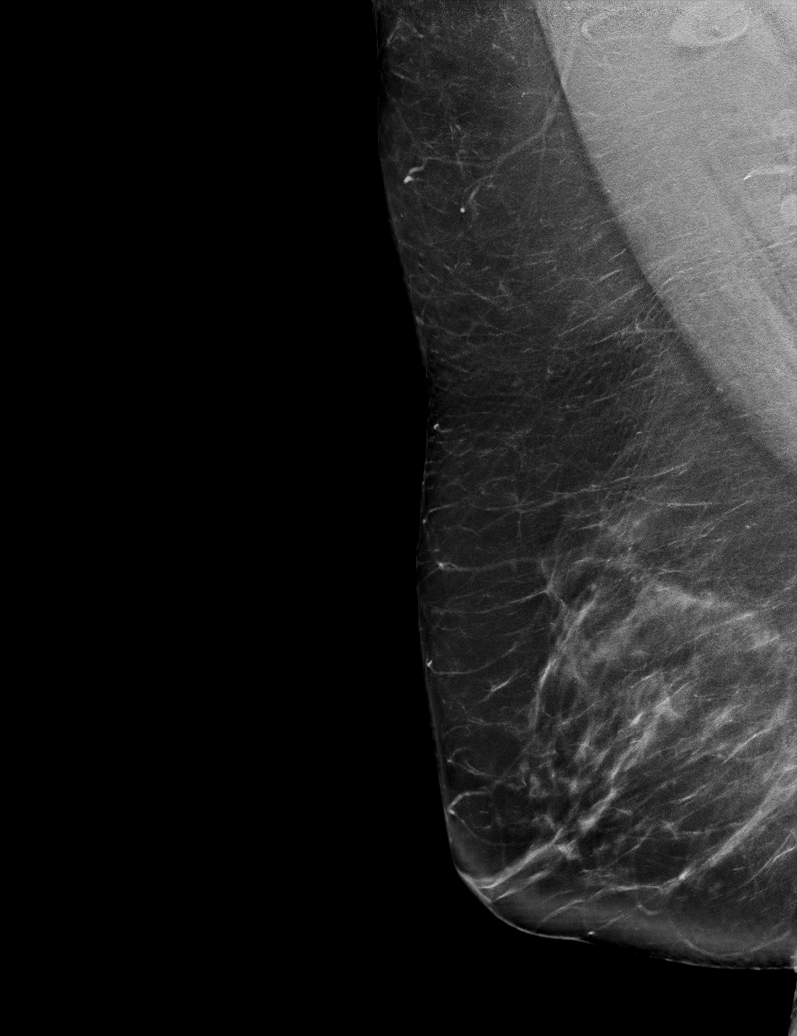

[L CC synth-2D]
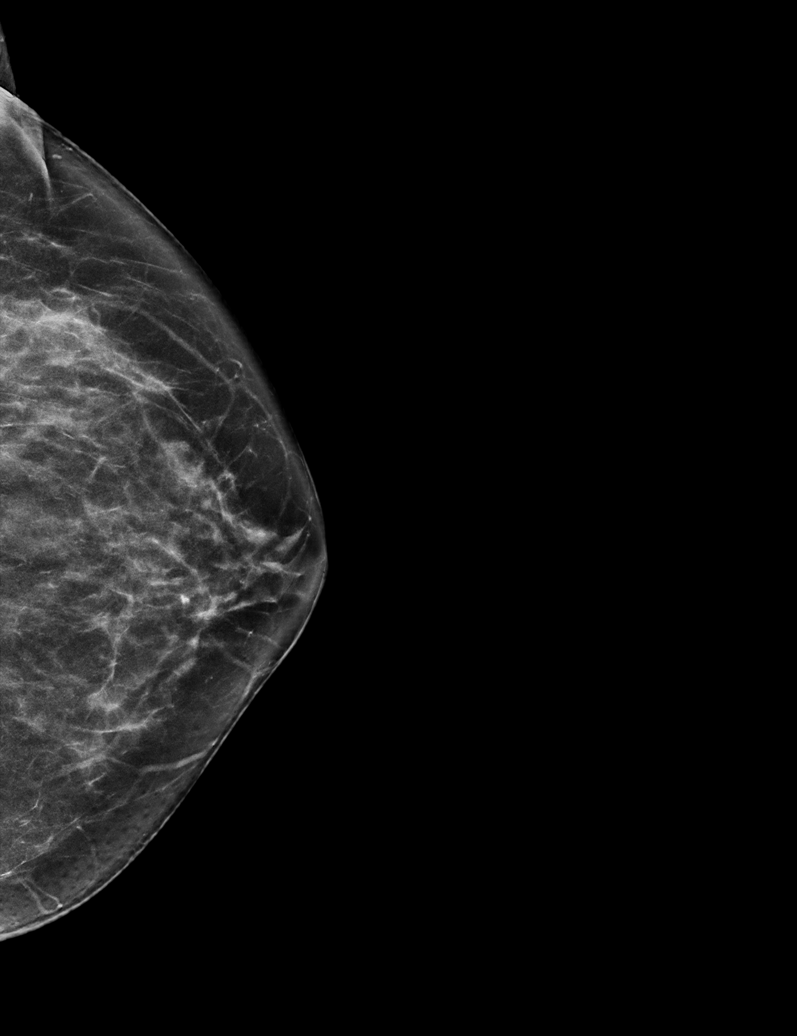

[R MLO synth-2D (2 of 2)]
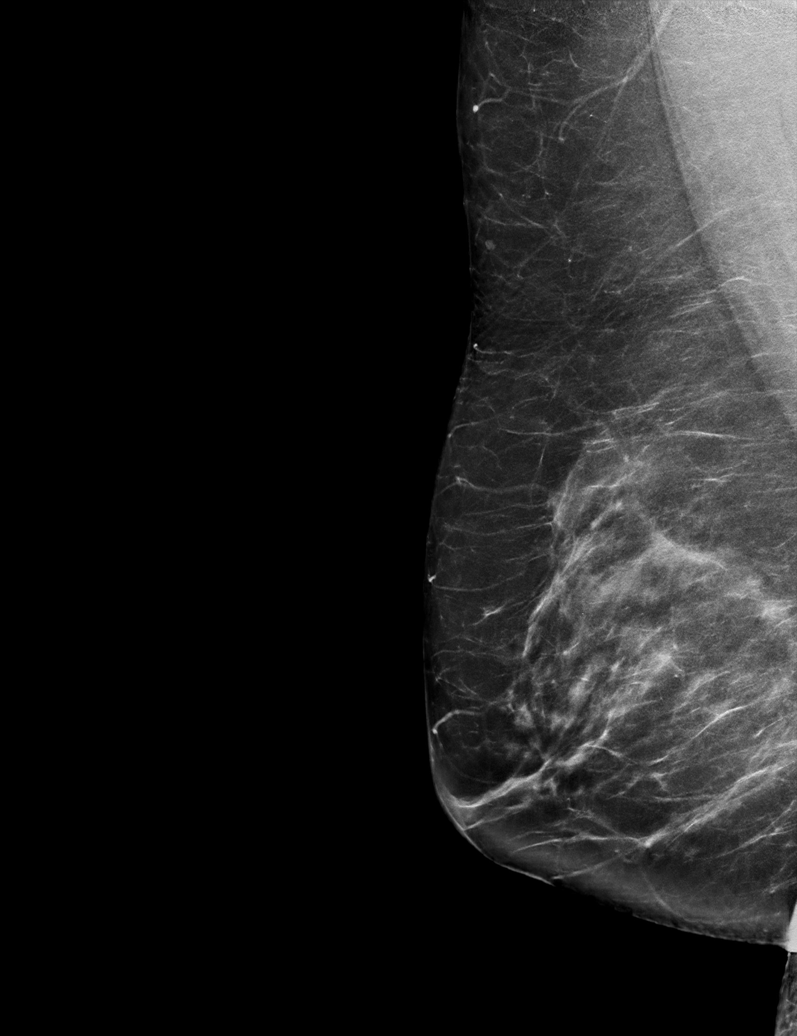

[L MLO synth-2D]
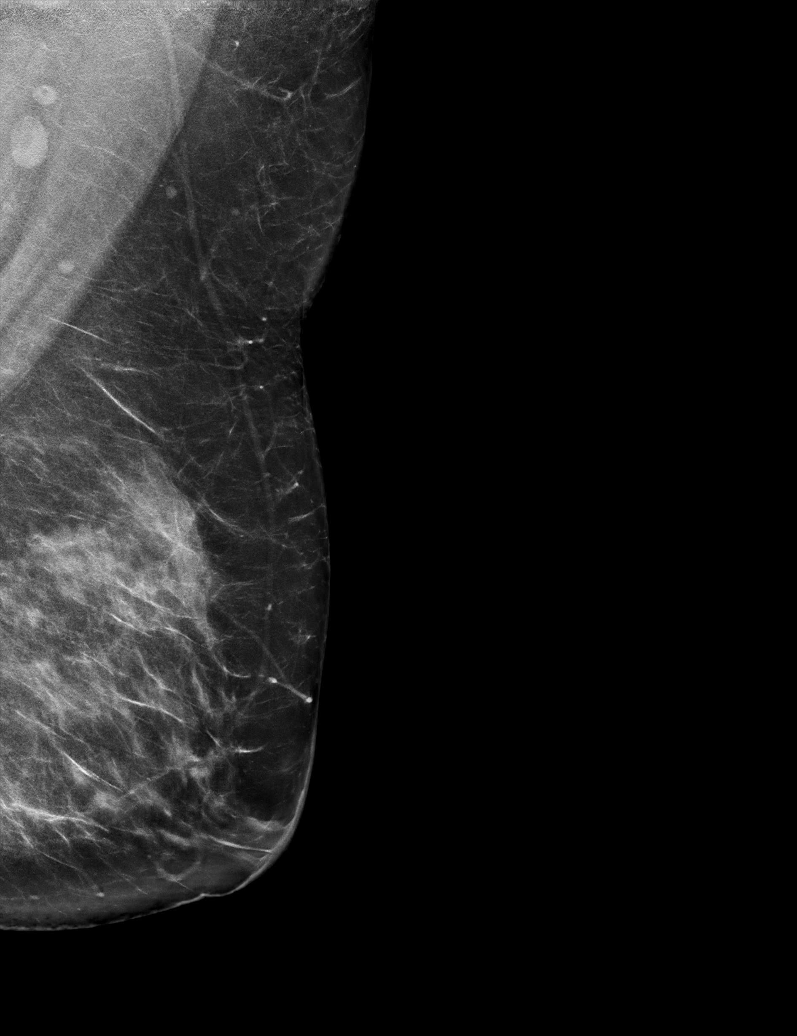

[R CC synth-2D]
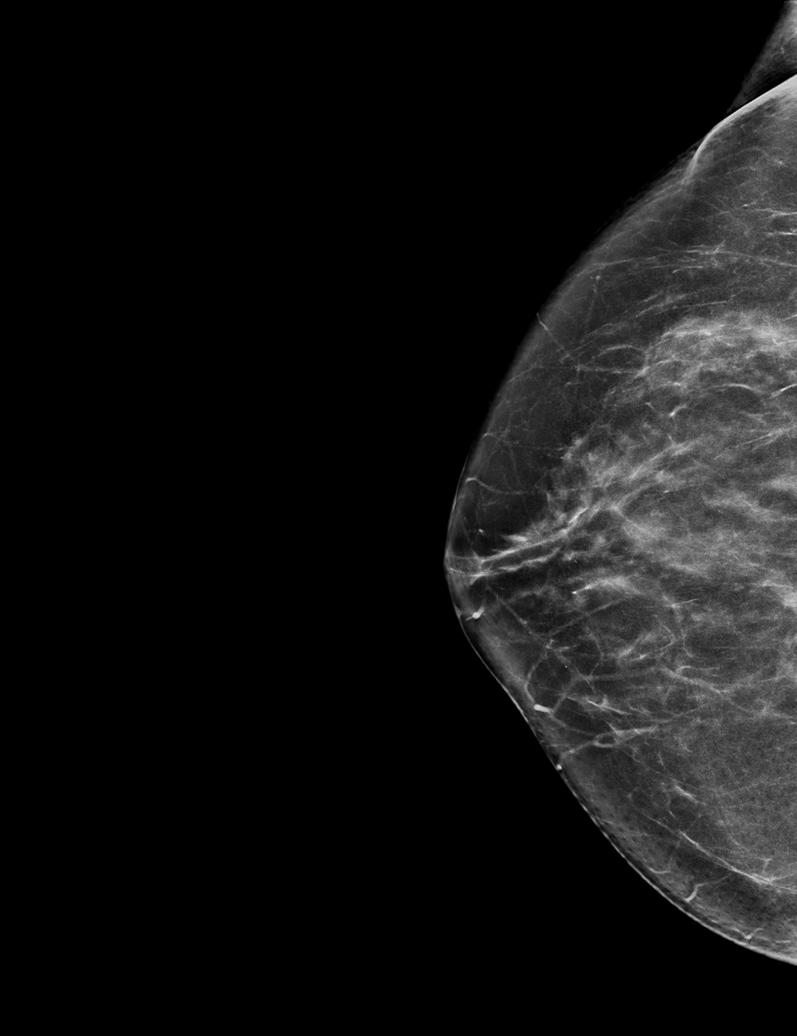

[R CC tomo · tomo slice 34/67.0]
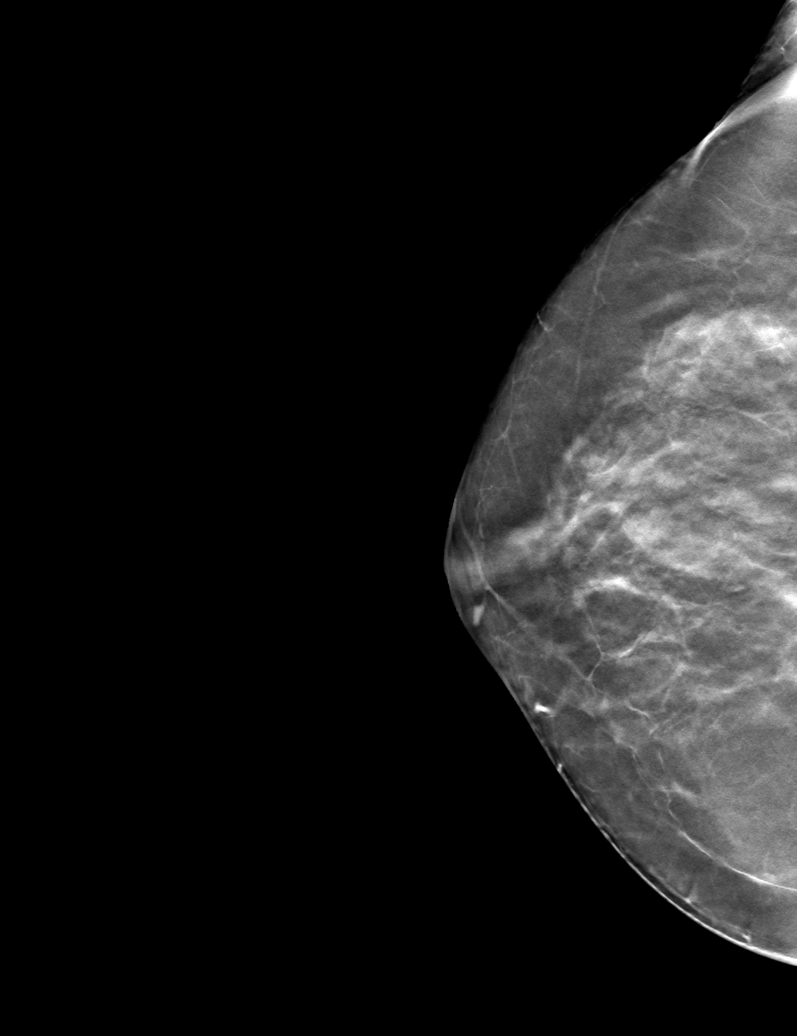

[6 of 35 positions shown; findings below may reference images not displayed]

ACR Breast Density Category b: There are scattered areas of
fibroglandular density.
FINDINGS: There are no findings suspicious for malignancy. Images were
processed with CAD.
IMPRESSION: No mammographic evidence of malignancy. A result letter of this
screening mammogram will be mailed directly to the patient.

RECOMMENDATION:
Screening mammogram in one year. (Code:[TQ])

BI-RADS CATEGORY  1: Negative.

## 2018-07-26 ENCOUNTER — Telehealth: Payer: Self-pay | Admitting: Licensed Clinical Social Worker

## 2018-07-26 NOTE — Telephone Encounter (Signed)
Called patient regarding upcoming [facility] appointment, left a voicemail and this will be a walk-in visit due to no communication to set up a virtual visit.

## 2018-07-30 ENCOUNTER — Inpatient Hospital Stay: Payer: Medicare Other

## 2018-07-30 ENCOUNTER — Encounter: Payer: Self-pay | Admitting: Licensed Clinical Social Worker

## 2018-07-30 ENCOUNTER — Inpatient Hospital Stay: Payer: Medicare Other | Attending: Genetic Counselor | Admitting: Licensed Clinical Social Worker

## 2018-07-30 DIAGNOSIS — Z801 Family history of malignant neoplasm of trachea, bronchus and lung: Secondary | ICD-10-CM | POA: Insufficient documentation

## 2018-07-30 DIAGNOSIS — Z803 Family history of malignant neoplasm of breast: Secondary | ICD-10-CM | POA: Diagnosis not present

## 2018-07-30 DIAGNOSIS — Z800 Family history of malignant neoplasm of digestive organs: Secondary | ICD-10-CM | POA: Diagnosis not present

## 2018-07-30 NOTE — Progress Notes (Signed)
REFERRING PROVIDER: Self-referred  PRIMARY PROVIDER:  McLean-Scocuzza, Nino Glow, MD  PRIMARY REASON FOR VISIT:  1. Family history of breast cancer   2. Family history of rectal cancer   3. Family history of lung cancer     I connected with [name] on 07/30/2018 at 2:00 PM EDT by Webex and verified that I am speaking with the correct person using two identifiers.    Patient location: home Provider location: clinic  HISTORY OF PRESENT ILLNESS:   Tabitha Lewis, a 68 y.o. female, was seen for a [place] cancer genetics consultation due to her sister's genetic testing which revealed a likely pathogenic variant in [place].  Tabitha Lewis presents to clinic today to discuss the possibility of a hereditary predisposition to cancer, genetic testing, and to further clarify her future cancer risks, as well as potential cancer risks for family members.    Tabitha Lewis is a 68 y.o. female with no personal history of cancer.    CANCER HISTORY:  Oncology History   No history exists.    RISK FACTORS:  Menarche was at age 47.  First live birth at age 53.  OCP use for approximately 5 years.  Ovaries intact: yes.  Hysterectomy: no.  Menopausal status: postmenopausal.  Colonoscopy: yes; normal. Mammogram within the last year: yes.  Past Medical History:  Diagnosis Date  . Allergy    i.e contact allergies [place] [facility]; also plants, trees   . Chicken pox   . Depression   . Family history of breast cancer    BRCA negative in the past  . Family history of breast cancer   . Family history of lung cancer   . Family history of rectal cancer   . History of prediabetes   . Hypertension   . Interstitial cystitis   . Interstitial cystitis   . Prediabetes    A1C 6.3 06/14/16  . Spinal stenosis   . UTI (urinary tract infection)   . UTI (urinary tract infection)     Past Surgical History:  Procedure Laterality Date  . denies     denies psuH    Social History    Socioeconomic History  . Marital status: Married    Spouse name: Not on file  . Number of children: Not on file  . Years of education: Not on file  . Highest education level: Not on file  Occupational History  . Not on file  Social Needs  . Financial resource strain: Not on file  . Food insecurity    Worry: Not on file    Inability: Not on file  . Transportation needs    Medical: Not on file    Non-medical: Not on file  Tobacco Use  . Smoking status: Former Research scientist (life sciences)  . Smokeless tobacco: Never Used  . Tobacco comment: smoked 4-5 years in college then quit  Substance and Sexual Activity  . Alcohol use: No    Frequency: Never  . Drug use: No  . Sexual activity: Not on file  Lifestyle  . Physical activity    Days per week: Not on file    Minutes per session: Not on file  . Stress: Not on file  Relationships  . Social Herbalist on phone: Not on file    Gets together: Not on file    Attends religious service: Not on file    Active member of club or organization: Not on file    Attends meetings of clubs  or organizations: Not on file    Relationship status: Not on file  Other Topics Concern  . Not on file  Social History Narrative   1 daughter and 1 son    3 grandsons    Married to high school sweet heart (husband) x 61 years dx'ed with prostate cancer in 04-10-2015 he died 06-09-17    Retired Engineer, manufacturing education      FAMILY HISTORY:  We obtained a detailed, 4-generation family history.  Significant diagnoses are listed below: Family History  Problem Relation Age of Onset  . Breast cancer Mother 43       s/p b/l masectomy   . Breast cancer Sister 56  . Breast cancer Maternal Aunt   . Breast cancer Maternal Grandmother   . Stroke Father        age 63   . Stroke Other        grandmother age 73   . Lupus Other     Tabitha Lewis has 1 son, in his 82s, and 1 daughter, in her 59s. No known cancer diagnoses. She has one sister, Hassan Rowan, who had breast  cancer at 30 and genetic testing. She is living at 66.  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 This testing was recently amended and the variant was upgraded to "likely pathogenic." Tabitha Lewis's daughter has also tested positive for the MRE11A variant.  Tabitha Lewis mother was diagnosed with breast cancer in her left breast at 68, and in her right breast at 90. She also may have had leukemia. The patient had 3 maternal uncles, and 1 maternal aunt. Her aunt had breast cancer, unsure of age of dx, and died in her 60s. An uncle had lung cancer and history of smoking. One of her maternal cousins had rectal cancer in her 51s. Her maternal grandmother died at 72 of a stroke, grandfather died around 67.  Tabitha Lewis father died at 62 of a stroke, no cancers. Patient had 2 paternal uncles, one had lung cancer and history of smoking. No known cancers in paternal cousins. No known cancers in paternal grandparents. Her grandmother died in her 56s and grandfather died at 33.   Tabitha Lewis is aware of previous family history of genetic testing for hereditary cancer risks. Patient's maternal ancestors are of unknown descent, and paternal ancestors are of [place] descent. There is no reported Ashkenazi Jewish ancestry. There is no known consanguinity.  GENETIC COUNSELING ASSESSMENT: Ms. Jurewicz is a 68 y.o. female with a family history of an MRE11A likely pathogenic variant, and a family history of breast cancer. We, therefore, discussed and recommended the following at today's visit.   DISCUSSION: We discussed that 5 - 10% of breast cancer is hereditary, with most cases associated with BRCA1/BRCA2. The patient reports having testing for these genes in 04/09/12 and was negative.  There are other genes that can be associated with hereditary breast cancer syndromes, one of those being MRE11A. We discussed this gene in detail, noting it's association with breast cancer and potentially  ovarian cancer. We do not currently have management guidelines for this gene.  We reviewed the characteristics, features and inheritance patterns of hereditary cancer syndromes. We also discussed genetic testing, including the appropriate family members to test, the process of testing, insurance coverage and turn-around-time for results. We discussed the implications of a negative, positive and/or variant of uncertain significant result. We recommended Ms. Natividad pursue genetic testing for the known  familial variant in MRE11A.   Based on Ms. Borden's family history of cancer, she meets medical criteria for genetic testing. [facility]'s familial variant testing program should cover the cost of her testing.   Based on the patient's family history, a statistical model Air cabin crew) was used to estimate her risk of developing breast cancer. This estimates her lifetime risk of developing breast cancer to be approximately 10%. This estimation does not consider any genetic testing results.  The patient's lifetime breast cancer risk is a preliminary estimate based on available information using one of several models endorsed by the [place] (ACS). The ACS recommends consideration of breast MRI screening as an adjunct to mammography for patients at high risk (defined as 20% or greater lifetime risk).     PLAN: After considering the risks, benefits, and limitations, Ms. Adcox provided informed consent to pursue genetic testing. A saliva kit was mailed to her, and she will mail her sample to [facility] for analysis of the MRE11A familial variant. Results should be available within approximately 2-3 weeks' time, at which point they will be disclosed by telephone to Ms. Bonds, as will any additional recommendations warranted by these results. Ms. Bridwell will receive a summary of her genetic counseling visit and a copy of her results once available. This information will also be  available in Epic.   Based on Ms. Lacosse's family history, we recommended her maternal relatives have genetic counseling and testing. Ms. Toothaker will let us know if we can be of any assistance in coordinating genetic counseling and/or testing for these family members.   Lastly, we encouraged Ms. Agresti to remain in contact with cancer genetics annually so that we can continuously update the family history and inform her of any changes in cancer genetics and testing that may be of benefit for this family.   Ms. Amundson questions were answered to her satisfaction today. Our contact information was provided should additional questions or concerns arise. Thank you for the referral and allowing [place] to share in the care of your patient.   Faith Rogue, MS, [place] Genetic Counselor [email or web address] Phone: [phone]  The patient was seen for a total of 35 minutes in virtual genetic counseling. A [employer] Intern, Darliss Ridgel, was also present and assisted with this case.  Drs. Magrinat, Lindi Adie and/or Burr Medico were available for discussion regarding this case.   _______________________________________________________________________ For Office Staff:  Number of people involved in session: 2 Was an Intern/ student involved with case: yes

## 2018-08-22 ENCOUNTER — Telehealth: Payer: Self-pay | Admitting: Licensed Clinical Social Worker

## 2018-08-22 ENCOUNTER — Encounter: Payer: Self-pay | Admitting: Licensed Clinical Social Worker

## 2018-08-22 ENCOUNTER — Ambulatory Visit: Payer: Self-pay | Admitting: Licensed Clinical Social Worker

## 2018-08-22 DIAGNOSIS — Z801 Family history of malignant neoplasm of trachea, bronchus and lung: Secondary | ICD-10-CM

## 2018-08-22 DIAGNOSIS — Z803 Family history of malignant neoplasm of breast: Secondary | ICD-10-CM

## 2018-08-22 DIAGNOSIS — Z1379 Encounter for other screening for genetic and chromosomal anomalies: Secondary | ICD-10-CM | POA: Insufficient documentation

## 2018-08-22 DIAGNOSIS — Z800 Family history of malignant neoplasm of digestive organs: Secondary | ICD-10-CM

## 2018-08-22 NOTE — Progress Notes (Signed)
Genetic Test Results  HPI:  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 Please refer to our prior cancer genetics clinic note for more information regarding our discussion, assessment and recommendations, at the time. [name]'s recent genetic test results were disclosed to her, as were recommendations warranted by these results. These results and recommendations are discussed in more detail below.  CANCER HISTORY:  Oncology History   No history exists.    FAMILY HISTORY:  We obtained a detailed, 4-generation family history.  Significant diagnoses are listed below: Family History  Problem Relation Age of Onset  . Breast cancer Mother 370       s/p b/l masectomy   . Breast cancer Sister 2465  . Breast cancer Maternal Aunt   . Breast cancer Maternal Grandmother   . Stroke Father        age 70   . Stroke Other        grandmother age 70   . Lupus Other     [name] has 1 son, in his 2340s, and 1 daughter, in her 30s. No known cancer diagnoses. She has one sister, Tabitha Lewis, who had breast cancer at 2171 and genetic testing. She is living at 3976.  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 This testing was recently amended and the variant was upgraded to "likely pathogenic." Tabitha Lewis's daughter has also tested positive for the MRE11A variant.  [name]'s mother was diagnosed with breast cancer in her left breast at 4878, and in her right breast at 2682. She also may have had leukemia. The patient had 3 maternal uncles, and 1 maternal aunt. Her aunt had breast cancer, unsure of age of dx, and died in her 7260s. An uncle had lung cancer and history of smoking. One of her maternal cousins had rectal cancer in her 3960s. Her maternal grandmother died at 442 of  a stroke, grandfather died around 5875.  [name]'s father died at 3257 of a stroke, no cancers. Patient had 2 paternal uncles, one had lung cancer and history of smoking. No known cancers in paternal cousins. No known cancers in paternal grandparents. Her grandmother died in her 5240s and grandfather died at 1770.   [name] is aware of previous family history of genetic testing for hereditary cancer risks. Patient's maternal ancestors are of unknown descent, and paternal ancestors are of [place] descent. There is no reported Ashkenazi Jewish ancestry. There is no known consanguinity.   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 The test report has been scanned into EPIC and is located under the Molecular Pathology section of the Results Review tab.  A portion of the result report is included below for reference.     This likely explains the cancer diagnoses in the family. We discussed, that there could be another gene that has not yet been discovered, or that we have not yet tested, that is responsible for the cancer diagnoses in the family.  Therefore, it is important to remain in touch with cancer genetics in the future so that we can continue to offer [name] the most up to date genetic testing.    ADDITIONAL GENETIC TESTING: We  discussed with [name] that there are other genes that are associated with increased cancer risk that can be analyzed. Should [name] wish to pursue additional genetic testing, we are happy to discuss and coordinate this testing, at any time.    MRE11A  CLINICAL CONDITION There is preliminary evidence suggesting that pathogenic variants in MRE11A may be associated with a predisposition to breast and ovarian cancer (PMID: 28315176, 16073710, 62694854, 62703500, 93818299, 37169678). The risk for other cancers may be  elevated as well, although this evidence is also emerging. MRE11A is therefore considered to be a "preliminary evidence" gene for autosomal dominant breast and ovarian cancer. Preliminary evidence genes are selected upon extensive review of the literature and are expert recommendations, but the association between the gene and the specific condition has not been completely established. This uncertainty may be resolved as new information becomes available, so clinicians may continue to order these preliminary evidence genes.  INHERITANCE Individuals with a single pathogenic variant in MRE11A have a 50% chance of passing that variant on to their offspring. Once a variant is detected in an individual, it is possible to identify at-risk relatives who can pursue testing for this specific familial variant. Many cases are inherited from a parent, but some cases can occur spontaneously (i.e., an individual with a pathogenic variant has parents who do not have it). An individual with a variant in MRE11A has a 50% risk of passing that variant on to offspring.  MANAGEMENT Because the evidence regarding MRE11A and breast and ovarian cancer risk is limited and preliminary, there are no guidelines or recommendations to suggest alteration to medical management based solely on the presence of a single pathogenic MRE11A variant. However, an individual's cancer risk and medical management are not determined by genetic test results alone. Overall cancer risk assessment incorporates additional factors, including personal medical history, family history, and any available genetic information that may result in a personalized plan for cancer prevention and surveillance.  Even though the data regarding individuals with a single pathogenic MRE11A are limited, knowing if a pathogenic variant is present is advantageous. At-risk relatives can be identified, enabling pursuit of a diagnostic evaluation. Further, the available  information regarding hereditary cancer susceptibility genes is constantly evolving and more clinically relevant data regarding MRE11A are likely to become available in the near future. Awareness of this variant encourages patients and their providers to inform at-risk family members, to diligently follow standard screening protocols, and to be vigilant in maintaining close and regular contact with their local genetics clinic in anticipation of new information.  FAMILY MEMBERS It is important that all of Ms. Hayner's relatives (both men and women) know of the presence of this gene mutation. Site-specific genetic testing can sort out who in the family is at risk and who is not.   Ms. Pitz children have a 50% chance to have inherited this mutation. We recommend they have genetic testing for this same mutation, as identifying the presence of this mutation would allow them to also take advantage of risk-reducing measures.   Additionally, individuals with a pathogenic variant in MRE11A are carriers of ataxia-telangiectasia-like disorder 1(ATLD1).   ATLD1 is an autosomal recessive condition that results when an individual inherits a pathogenic MRE11A variant from each parent. This is a rare disorder that is characterized by progressive cerebellar ataxia, dysarthria, and abnormal eye movements in the absence of telangiectasia. While affected individuals display normal levels of total IgG, IgA, and IgM, there may be reduced levels of specific  functional antibodies (PMID: 1610960424733832, [id number], [id number], [id number]). For there to be a risk of ATLD1 in offspring, both parents would each have to have a single pathogenic variant in MRE11A; in such a case, the risk of having an affected child is 25%.  We encouraged [name] to remain in contact with us on an annual basis so we can update her personal and family histories, and let her know of advances in cancer genetics that may benefit the family. Our  contact number was provided. [name]'s questions were answered to her satisfaction today, and she knows she is welcome to call anytime with additional questions.   Lacy DuverneyBrianna Babatunde Seago, MS, [facility] Genetic Counselor [email or web address] Phone: [phone]

## 2018-08-22 NOTE — Telephone Encounter (Signed)
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 We reviewed this gene and that currently there are no management guidelines associated with it. We discussed that her daughter and son could have testing. She will give her daughter my number and she will call in the next few weeks.

## 2018-09-11 ENCOUNTER — Encounter: Payer: Self-pay | Admitting: Internal Medicine

## 2018-09-11 ENCOUNTER — Ambulatory Visit (INDEPENDENT_AMBULATORY_CARE_PROVIDER_SITE_OTHER): Payer: Medicare Other | Admitting: Internal Medicine

## 2018-09-11 ENCOUNTER — Other Ambulatory Visit: Payer: Self-pay

## 2018-09-11 DIAGNOSIS — R7303 Prediabetes: Secondary | ICD-10-CM | POA: Diagnosis not present

## 2018-09-11 DIAGNOSIS — Z803 Family history of malignant neoplasm of breast: Secondary | ICD-10-CM

## 2018-09-11 DIAGNOSIS — J309 Allergic rhinitis, unspecified: Secondary | ICD-10-CM

## 2018-09-11 DIAGNOSIS — Z1329 Encounter for screening for other suspected endocrine disorder: Secondary | ICD-10-CM | POA: Diagnosis not present

## 2018-09-11 DIAGNOSIS — I10 Essential (primary) hypertension: Secondary | ICD-10-CM | POA: Diagnosis not present

## 2018-09-11 DIAGNOSIS — N3000 Acute cystitis without hematuria: Secondary | ICD-10-CM | POA: Diagnosis not present

## 2018-09-11 DIAGNOSIS — E559 Vitamin D deficiency, unspecified: Secondary | ICD-10-CM

## 2018-09-11 DIAGNOSIS — G4700 Insomnia, unspecified: Secondary | ICD-10-CM

## 2018-09-11 MED ORDER — AZELASTINE-FLUTICASONE 137-50 MCG/ACT NA SUSP: NASAL | 11 refills | Status: DC

## 2018-09-11 MED ORDER — TRAZODONE HCL 100 MG PO TABS: 100.0000 mg | ORAL_TABLET | Freq: Every evening | ORAL | 3 refills | Status: DC | PRN

## 2018-09-11 MED ORDER — LISINOPRIL-HYDROCHLOROTHIAZIDE 20-12.5 MG PO TABS: 1.0000 | ORAL_TABLET | Freq: Every day | ORAL | 3 refills | Status: DC

## 2018-09-11 MED ORDER — OLOPATADINE HCL 0.2 % OP SOLN: OPHTHALMIC | 11 refills | Status: DC

## 2018-09-11 NOTE — Progress Notes (Signed)
Telephone Note  I connected with Tabitha Lewis  on 09/11/18 at 10:30 AM EDT by telephone and verified that I am speaking with the correct person using two identifiers.  Location patient: home Location provider:work or home office Persons participating in the virtual visit: patient, provider  I discussed the limitations of evaluation and management by telemedicine and the availability of in person appointments. The patient expressed understanding and agreed to proceed.   HPI: 1. htn on lis-hct 20-12.5 mg qd  2. 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  Past Medical History:  Diagnosis Date  . Allergy    i.e contact allergies [place] [facility]; also plants, trees   . Chicken pox   . Depression   . Family history of breast cancer    BRCA negative in the past  . Family history of breast cancer   . Family history of lung cancer   . Family history of rectal cancer   . History of prediabetes   . Hypertension   . Interstitial cystitis   . Interstitial cystitis   . Prediabetes    A1C 6.3 06/14/16  . Spinal stenosis   . UTI (urinary tract infection)   . UTI (urinary tract infection)     Past Surgical History:  Procedure Laterality Date  . denies     denies psuH    Family History  Problem Relation Age of Onset  . Breast cancer Mother 75       s/p b/l masectomy   . Breast cancer Sister 53  . Breast cancer Maternal Aunt   . Breast cancer Maternal Grandmother   . Stroke Father        age 78   . Stroke Other        grandmother age 39   . Lupus Other     SOCIAL HX: widowed    Current Outpatient Medications:  .  Azelastine-Fluticasone 137-50 MCG/ACT SUSP, 1 spray bid, Disp: 23 g, Rfl: 11 .  cetirizine (ZYRTEC) 10 MG tablet, Take by mouth., Disp: , Rfl:  .  Cholecalciferol (VITAMIN D3 PO), Take 5,000 Units by mouth., Disp: , Rfl:  .   lisinopril-hydrochlorothiazide (ZESTORETIC) 20-12.5 MG tablet, Take 1 tablet by mouth daily., Disp: 90 tablet, Rfl: 3 .  montelukast (SINGULAIR) 10 MG tablet, Take 1 tablet (10 mg total) by mouth at bedtime. (Patient not taking: Reported on 09/11/2018), Disp: 90 tablet, Rfl: 0 .  Olopatadine HCl 0.2 % SOLN, One drop to each eye daily, Disp: 2.5 mL, Rfl: 11 .  traZODone (DESYREL) 100 MG tablet, Take 1 tablet (100 mg total) by mouth at bedtime as needed for sleep., Disp: 90 tablet, Rfl: 3  EXAM:  VITALS per patient if applicable:  GENERAL: alert, oriented, appears well and in no acute distress  PSYCH/NEURO: pleasant and cooperative, no obvious depression or anxiety, speech and thought processing grossly intact  ASSESSMENT AND PLAN:  Discussed the following assessment and plan:  Essential hypertension - Plan: Comprehensive metabolic panel, Lipid panel, CBC with Differential/Platelet, lisinopril-hydrochlorothiazide (ZESTORETIC) 20-12.5 MG tablet qd    Acute cystitis without hematuria - Plan: Urinalysis, Routine w reflex microscopic, Urine Culture  Prediabetes - Plan: Hemoglobin A1c  Vitamin D deficiency - Plan: Vitamin D (25 hydroxy)  Insomnia, unspecified type - Plan: traZODone (DESYREL) 100 MG tablet qhs prn   Allergic rhinitis, unspecified seasonality, unspecified trigger - Plan: Olopatadine  HCl 0.2 % SOLN, Azelastine-Fluticasone 137-50 MCG/ACT SUSP  Family history of breast cancer with MRE11 A gene +  H Will get flu shotpharmacy  Tdap had 07/22/15 pna 23 11/20/14 prevnar utd Consider shingrix in future  Hep B immune, MMR immune D3 5000 IU daily   Last pap 07/22/15 negative HPV neg  Mammogram 07/23/18 neg Pt wants to hold on dexa fo rnowas of 09/11/18  cologaurdneg 12/30/16  [facility] [place] [facility] neg f/u in 1 year     I discussed the assessment and treatment plan with the patient. The patient was provided an opportunity to ask questions and all were  answered. The patient agreed with the plan and demonstrated an understanding of the instructions.   The patient was advised to call back or seek an in-person evaluation if the symptoms worsen or if the condition fails to improve as anticipated.  Time spent 15 minutes  Delorise Jackson, MD

## 2018-12-24 ENCOUNTER — Other Ambulatory Visit: Payer: Self-pay

## 2018-12-26 ENCOUNTER — Other Ambulatory Visit: Payer: Medicare Other

## 2019-02-19 ENCOUNTER — Ambulatory Visit (INDEPENDENT_AMBULATORY_CARE_PROVIDER_SITE_OTHER): Payer: Medicare PPO

## 2019-02-19 ENCOUNTER — Other Ambulatory Visit: Payer: Self-pay

## 2019-02-19 VITALS — Ht 64.0 in | Wt 181.0 lb

## 2019-02-19 DIAGNOSIS — Z0000 Encounter for general adult medical examination without abnormal findings: Secondary | ICD-10-CM | POA: Diagnosis not present

## 2019-02-19 NOTE — Patient Instructions (Addendum)
  Tabitha Lewis , Thank you for taking time to come for your Medicare Wellness Visit. I appreciate your ongoing commitment to your health goals. Please review the following plan we discussed and let me know if I can assist you in the future.   These are the goals we discussed: Goals      Patient Stated   . Increase physical activity (pt-stated)       This is a list of the screening recommended for you and due dates:  Health Maintenance  Topic Date Due  . DEXA scan (bone density measurement)  02/19/2020*  . Mammogram  07/22/2020  . Tetanus Vaccine  07/21/2025  . Colon Cancer Screening  12/30/2026  . Flu Shot  Completed  .  Hepatitis C: One time screening is recommended by Center for Disease Control  (CDC) for  adults born from [birth year] through 1965.   Completed  . Pneumonia vaccines  Completed  *Topic was postponed. The date shown is not the original due date.

## 2019-02-19 NOTE — Progress Notes (Signed)
Subjective:   Tabitha Lewis is a 69 y.o. female who presents for an Initial Medicare Annual Wellness Visit.  Review of Systems    No ROS.  Medicare Wellness Virtual Visit.  Visual/audio telehealth visit, UTA vital signs.   Ht/Wt provided.  See social history for additional risk factors.    Cardiac Risk Factors include: advanced age (>6mn, >>44women);hypertension     Objective:    Today's Vitals   02/19/19 0905  Weight: 181 lb (82.1 kg)  Height: '5\' 4"'$  (1.626 m)   Body mass index is 31.07 kg/m.  Advanced Directives 02/19/2019  Does Patient Have a Medical Advance Directive? Yes  [place]  Does patient want to make changes to medical advance directive? No - Patient declined  Copy of [place]? No - copy requested    Current Medications (verified) Outpatient Encounter Medications as of 02/19/2019  Medication Sig  . Azelastine-Fluticasone 137-50 MCG/ACT SUSP 1 spray bid  . cetirizine (ZYRTEC) 10 MG tablet Take by mouth.  . Cholecalciferol (VITAMIN D3 PO) Take 5,000 Units by mouth.  .Marland Kitchenlisinopril-hydrochlorothiazide (ZESTORETIC) 20-12.5 MG tablet Take 1 tablet by mouth daily.  . montelukast (SINGULAIR) 10 MG tablet Take 1 tablet (10 mg total) by mouth at bedtime. (Patient not taking: Reported on 09/11/2018)  . Olopatadine HCl 0.2 % SOLN One drop to each eye daily  . traZODone (DESYREL) 100 MG tablet Take 1 tablet (100 mg total) by mouth at bedtime as needed for sleep.   No facility-administered encounter medications on file as of 02/19/2019.    Allergies (verified) Benzalkonium chloride, Cetyl alcohol, Codeine, Lanolin, Neomycin, Nickel, and Propylene glycol   History: Past Medical History:  Diagnosis Date  . Allergy    i.e contact allergies [place] [facility] also plants, trees   . Chicken pox   . Depression   . Family history of breast cancer    BRCA negative in  the past  . Family history of breast cancer   . Family history of lung cancer   . Family history of rectal cancer   . History of prediabetes   . Hypertension   . Interstitial cystitis   . Interstitial cystitis   . Prediabetes    A1C 6.3 06/14/16  . Spinal stenosis   . UTI (urinary tract infection)   . UTI (urinary tract infection)    Past Surgical History:  Procedure Laterality Date  . denies     denies psuH   Family History  Problem Relation Age of Onset  . Breast cancer Mother 773      s/p b/l masectomy   . Breast cancer Sister 655 . Breast cancer Maternal Aunt   . Breast cancer Maternal Grandmother   . Stroke Father        age 963  . Stroke Other        grandmother age 71  . Lupus Other    Social History   Socioeconomic History  . Marital status: Married    Spouse name: Not on file  . Number of children: Not on file  . Years of education: Not on file  . Highest education level: Not on file  Occupational History  . Not on file  Tobacco Use  . Smoking status: Former SResearch scientist (life sciences) . Smokeless tobacco: Never Used  . Tobacco comment: smoked 4-5 years in college then quit  Substance and Sexual Activity  .  Alcohol use: No  . Drug use: No  . Sexual activity: Not on file  Other Topics Concern  . Not on file  Social History Narrative   1 daughter and 1 son    3 grandsons    Married to high school sweet heart (husband) x 108 years dx'ed with prostate cancer in 04/19/15 he died 2017/06/18    Retired Engineer, manufacturing education    Social Determinants of Health   Financial Resource Strain: [place]   . Difficulty of Paying Living Expenses: Not hard at all  Food Insecurity: No Food Insecurity  . Worried About Charity fundraiser in the Last Year: Never true  . Ran Out of Food in the Last Year: Never true  Transportation Needs: No Transportation Needs  . Lack of Transportation (Medical): No  . Lack of Transportation (Non-Medical): No  Physical Activity:   . Days of Exercise  per Week: Not on file  . Minutes of Exercise per Session: Not on file  Stress: No Stress Concern Present  . Feeling of Stress : Not at all  Social Connections: Unknown  . Frequency of Communication with Friends and Family: More than three times a week  . Frequency of Social Gatherings with Friends and Family: More than three times a week  . Attends Religious Services: Not on file  . Active Member of Clubs or Organizations: Yes  . Attends Archivist Meetings: 1 to 4 times per year  . Marital Status: Married    Tobacco Counseling Counseling given: Not Answered Comment: smoked 4-5 years in college then quit   Clinical Intake:  Pre-visit preparation completed: Yes        Diabetes: No  How often do you need to have someone help you when you read instructions, pamphlets, or other written materials from your doctor or pharmacy?: 1 - Never  Interpreter Needed?: No      Activities of Daily Living In your present state of health, do you have any difficulty performing the following activities: 02/19/2019  Hearing? N  Vision? N  Difficulty concentrating or making decisions? N  Walking or climbing stairs? N  Dressing or bathing? N  Doing errands, shopping? N  Preparing Food and eating ? N  Using the Toilet? N  In the past six months, have you accidently leaked urine? N  Do you have problems with loss of bowel control? N  Managing your Medications? N  Managing your Finances? N  Housekeeping or managing your Housekeeping? N  Some recent data might be hidden     Immunizations and Health Maintenance Immunization History  Administered Date(s) Administered  . Hepatitis B, adult 02/02/2017, 03/07/2017, 08/03/2017  . Influenza Split 11/20/2014  . Influenza, High Dose Seasonal PF 09/27/2016, 09/26/2017, 09/27/2018  . Influenza-Unspecified 10/20/2015, 09/27/2016  . PFIZER SARS-COV-2 Vaccination 02/05/2019  . Pneumococcal Conjugate-13 09/13/2017  . Pneumococcal  Polysaccharide-23 11/20/2014  . Tdap 07/22/2015   There are no preventive care reminders to display for this patient.  Patient Care Team: McLean-Scocuzza, Nino Glow, MD as PCP - General (Internal Medicine)  Indicate any recent Medical Services you may have received from other than [facility] providers in the past year (date may be approximate).     Assessment:   This is a routine wellness examination for Kalii.  Nurse connected with patient 02/19/19 at  9:00 AM EST by a telephone enabled telemedicine application and verified that I am speaking with the correct person using two identifiers. Patient stated  full name and DOB. Patient gave permission to continue with virtual visit. Patient's location was at home and Nurse's location was at [place] office.   Patient is alert and oriented x3. Patient denies difficulty focusing or concentrating. 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   Health Maintenance Due: -Dexa Scan- declined -See completed HM at the end of note.   Eye: Visual acuity not assessed. Virtual visit. Followed by their ophthalmologist.  Dental: UTD  Hearing: Demonstrates normal hearing during visit.  Safety:  Patient feels safe at home- yes Patient does have smoke detectors at home- yes Patient does wear sunscreen or protective clothing when in direct sunlight - yes Patient does wear seat belt when in a moving vehicle - yes Patient drives- yes Adequate lighting in walkways free from debris- yes Grab bars and handrails used as appropriate- yes Ambulates with an assistive device- no  Social: Alcohol intake - no     Smoking history- former  Smokers in home? none Illicit drug use? none  Medication: Taking as directed and without issues.  Self managed - yes   Covid-19: Precautions and sickness symptoms discussed. Wears mask, social distancing, hand hygiene as appropriate.   Activities of Daily Living Patient denies  needing assistance with: household chores, feeding themselves, getting from bed to chair, getting to the toilet, bathing/showering, dressing, managing money, or preparing meals.   Discussed the importance of a healthy diet, water intake and the benefits of aerobic exercise.  Physical activity- active around the home, no routine.   Diet:  Regular Water: good intake  Other Providers Patient Care Team: McLean-Scocuzza, Nino Glow, MD as PCP - General (Internal Medicine)  Hearing/Vision screen  Hearing Screening   '125Hz'$  '250Hz'$  '500Hz'$  '1000Hz'$  '2000Hz'$  '3000Hz'$  '4000Hz'$  '6000Hz'$  '8000Hz'$   Right ear:           Left ear:           Comments: Patient is able to hear conversational tones without difficulty.  No issues reported.  Vision Screening Comments: Wears corrective lenses Cataract extraction, bilateral Visual acuity not assessed, virtual visit.  They have seen their ophthalmologist in the last 12 months.     Dietary issues and exercise activities discussed: Current Exercise Habits: Home exercise routine, Intensity: Mild  Goals      Patient Stated   . Increase physical activity (pt-stated)      Depression Screen PHQ 2/9 Scores 02/19/2019 06/19/2017  PHQ - 2 Score 0 0    Fall Risk Fall Risk  02/19/2019 06/19/2017  Falls in the past year? 0 No  Follow up Falls evaluation completed -   Timed Get Up and Go Performed no, virtual visit  Cognitive Function:     6CIT Screen 02/19/2019  What Year? 0 points  What month? 0 points  What time? 0 points  Count back from 20 0 points  Months in reverse 0 points  Repeat phrase 0 points  Total Score 0    Screening Tests Health Maintenance  Topic Date Due  . DEXA SCAN  02/19/2020 (Originally 07/09/2013)  . MAMMOGRAM  07/22/2020  . TETANUS/TDAP  07/21/2025  . COLONOSCOPY  12/30/2026  . INFLUENZA VACCINE  Completed  . Hepatitis C Screening  Completed  . PNA vac Low Risk Adult  Completed     Plan:   Keep all routine maintenance appointments.    Next scheduled lab 05/20/19 @ 10:00.  Follow up 03/14/19 @ 10:30.  Medicare Attestation I have personally reviewed: The patient's  medical and social history Their use of alcohol, tobacco or illicit drugs Their current medications and supplements The patient's functional ability including ADLs,fall risks, home safety risks, cognitive, and hearing and visual impairment Diet and physical activities Evidence for depression   I have reviewed and discussed with patient certain preventive protocols, quality metrics, and best practice recommendations.     Varney Biles, LPN   07/17/2456

## 2019-03-14 ENCOUNTER — Ambulatory Visit (INDEPENDENT_AMBULATORY_CARE_PROVIDER_SITE_OTHER): Payer: Medicare PPO | Admitting: Internal Medicine

## 2019-03-14 ENCOUNTER — Encounter: Payer: Self-pay | Admitting: Internal Medicine

## 2019-03-14 ENCOUNTER — Other Ambulatory Visit: Payer: Self-pay

## 2019-03-14 VITALS — Ht 64.0 in | Wt 181.0 lb

## 2019-03-14 DIAGNOSIS — N3000 Acute cystitis without hematuria: Secondary | ICD-10-CM | POA: Diagnosis not present

## 2019-03-14 DIAGNOSIS — E559 Vitamin D deficiency, unspecified: Secondary | ICD-10-CM | POA: Diagnosis not present

## 2019-03-14 DIAGNOSIS — J309 Allergic rhinitis, unspecified: Secondary | ICD-10-CM | POA: Diagnosis not present

## 2019-03-14 DIAGNOSIS — I10 Essential (primary) hypertension: Secondary | ICD-10-CM | POA: Diagnosis not present

## 2019-03-14 DIAGNOSIS — Z1231 Encounter for screening mammogram for malignant neoplasm of breast: Secondary | ICD-10-CM

## 2019-03-14 NOTE — Progress Notes (Signed)
Virtual Visit via Video Note  I connected with Tabitha Lewis  on 03/14/19 at 11 AM EST by a video enabled telemedicine application and verified that I am speaking with the correct person using two identifiers.  Location patient: home Location provider:work or home office Persons participating in the virtual visit: patient, provider  I discussed the limitations of evaluation and management by telemedicine and the availability of in person appointments. The patient expressed understanding and agreed to proceed.   HPI: 1. HTN on lis hctz today 20-12.5 no reading today doing well  2. 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 Recurrent UTI no sx's  4. She has had falls off ladder but did not hurt herself  5. Vitamin D def pending labs taking D3 otc now    ROS: See pertinent positives and negatives per HPI. Denies cp, sob, blood in stool  Past Medical History:  Diagnosis Date  . Allergy    i.e contact allergies [place] [facility]; also plants, trees   . Chicken pox   . Depression   . Family history of breast cancer    BRCA negative in the past  . Family history of breast cancer   . Family history of lung cancer   . Family history of rectal cancer   . History of prediabetes   . Hypertension   . Interstitial cystitis   . Interstitial cystitis   . Prediabetes    A1C 6.3 06/14/16  . Spinal stenosis   . UTI (urinary tract infection)   . UTI (urinary tract infection)     Past Surgical History:  Procedure Laterality Date  . denies     denies psuH    Family History  Problem Relation Age of Onset  . Breast cancer Mother 90       s/p b/l masectomy   . Breast cancer Sister 59  . Breast cancer Maternal Aunt   . Breast cancer Maternal Grandmother   . Stroke Father        age 5   . Stroke  Other        grandmother age 51   . Lupus Other     SOCIAL HX:  1 daughter and 1 son  3 grandsons  Married to high school sweet heart (husband) x 80 years dx'ed with prostate cancer in 04/04/15 he died 2017/06/03  Retired Air traffic controller education    Current Outpatient Medications:  .  Azelastine-Fluticasone 137-50 MCG/ACT SUSP, 1 spray bid, Disp: 23 g, Rfl: 11 .  cetirizine (ZYRTEC) 10 MG tablet, Take by mouth., Disp: , Rfl:  .  Cholecalciferol (VITAMIN D3 PO), Take 5,000 Units by mouth., Disp: , Rfl:  .  lisinopril-hydrochlorothiazide (ZESTORETIC) 20-12.5 MG tablet, Take 1 tablet by mouth daily., Disp: 90 tablet, Rfl: 3 .  Olopatadine HCl 0.2 % SOLN, One drop to each eye daily, Disp: 2.5 mL, Rfl: 11 .  traZODone (DESYREL) 100 MG tablet, Take 1 tablet (100 mg total) by mouth at bedtime as needed for sleep., Disp: 90 tablet, Rfl: 3  EXAM:  VITALS per patient if applicable:  GENERAL: alert, oriented, appears well and in no acute distress  HEENT: atraumatic, conjunttiva clear, no obvious abnormalities on inspection of external nose and ears  NECK: normal movements of the head and neck  LUNGS: on inspection no signs of respiratory distress, breathing rate appears normal, no obvious gross SOB, gasping or wheezing  CV: no obvious cyanosis  MS: moves all visible extremities without noticeable abnormality  PSYCH/NEURO: pleasant and cooperative, no obvious depression or anxiety, speech and thought processing grossly intact  ASSESSMENT AND PLAN:  Discussed the following assessment and plan:  Essential hypertension Cont meds  Fasting lasb 05/2019   Vitamin D deficiency rec mvt with 1K D3 +4K IU qd  Acute cystitis without hematuria Check urine cx upcoming   Allergies  Cont pataday eye drops  Prn zyrtec  Stopped singulair   HM Flu utd  Tdap had 07/22/15 pna 23 11/20/14 prevnarutd Consider shingrix in future  2/2/ covid 19 vaccines Hep Bimmune, MMR immune D3 5000 IU  daily  Last pap 07/22/15 negative HPV neg out of age window  Mammogram 07/23/18 neg ordered  Pt wants to hold on dexa for now 03/14/19 would not wants meds if abnormal due to side effects concern  -get copy Fidelis or Dr. Cherylann Banas Ob/GYN cologaurdneg 12/30/16  [facility] [place] [facility] neg f/u in 1 yearsaw in 07/2018 and f/u in 1 year h/o eczema has steroid cream to use   -we discussed possible serious and likely etiologies, options for evaluation and workup, limitations of telemedicine visit vs in person visit, treatment, treatment risks and precautions. Pt prefers to treat via telemedicine empirically rather then risking or undertaking an in person visit at this moment. Patient agrees to seek prompt in person care if worsening, new symptoms arise, or if is not improving with treatment.   I discussed the assessment and treatment plan with the patient. The patient was provided an opportunity to ask questions and all were answered. The patient agreed with the plan and demonstrated an understanding of the instructions.   The patient was advised to call back or seek an in-person evaluation if the symptoms worsen or if the condition fails to improve as anticipated.  Time 20 minutes Delorise Jackson, MD

## 2019-04-16 ENCOUNTER — Encounter: Payer: Self-pay | Admitting: Internal Medicine

## 2019-05-20 ENCOUNTER — Other Ambulatory Visit: Payer: Medicare Other

## 2019-08-09 DIAGNOSIS — D2262 Melanocytic nevi of left upper limb, including shoulder: Secondary | ICD-10-CM | POA: Diagnosis not present

## 2019-08-09 DIAGNOSIS — D225 Melanocytic nevi of trunk: Secondary | ICD-10-CM | POA: Diagnosis not present

## 2019-08-09 DIAGNOSIS — L570 Actinic keratosis: Secondary | ICD-10-CM | POA: Diagnosis not present

## 2019-08-09 DIAGNOSIS — D2271 Melanocytic nevi of right lower limb, including hip: Secondary | ICD-10-CM | POA: Diagnosis not present

## 2019-08-09 DIAGNOSIS — L308 Other specified dermatitis: Secondary | ICD-10-CM | POA: Diagnosis not present

## 2019-08-09 DIAGNOSIS — D2272 Melanocytic nevi of left lower limb, including hip: Secondary | ICD-10-CM | POA: Diagnosis not present

## 2019-08-09 DIAGNOSIS — D2261 Melanocytic nevi of right upper limb, including shoulder: Secondary | ICD-10-CM | POA: Diagnosis not present

## 2019-08-09 DIAGNOSIS — L708 Other acne: Secondary | ICD-10-CM | POA: Diagnosis not present

## 2019-08-09 DIAGNOSIS — L821 Other seborrheic keratosis: Secondary | ICD-10-CM | POA: Diagnosis not present

## 2019-08-23 ENCOUNTER — Other Ambulatory Visit: Payer: Self-pay | Admitting: Internal Medicine

## 2019-08-23 DIAGNOSIS — I10 Essential (primary) hypertension: Secondary | ICD-10-CM

## 2019-08-23 DIAGNOSIS — J309 Allergic rhinitis, unspecified: Secondary | ICD-10-CM

## 2019-08-23 DIAGNOSIS — G4700 Insomnia, unspecified: Secondary | ICD-10-CM

## 2019-08-23 MED ORDER — LISINOPRIL-HYDROCHLOROTHIAZIDE 20-12.5 MG PO TABS: 1.0000 | ORAL_TABLET | Freq: Every day | ORAL | 3 refills | Status: DC

## 2019-08-23 MED ORDER — OLOPATADINE HCL 0.2 % OP SOLN: OPHTHALMIC | 11 refills | Status: DC

## 2019-08-23 MED ORDER — TRAZODONE HCL 100 MG PO TABS: 100.0000 mg | ORAL_TABLET | Freq: Every evening | ORAL | 3 refills | Status: DC | PRN

## 2019-08-23 MED ORDER — AZELASTINE-FLUTICASONE 137-50 MCG/ACT NA SUSP: NASAL | 11 refills | Status: DC

## 2019-09-10 ENCOUNTER — Other Ambulatory Visit: Payer: Self-pay

## 2019-09-10 ENCOUNTER — Ambulatory Visit
Admission: RE | Admit: 2019-09-10 | Discharge: 2019-09-10 | Disposition: A | Discharge status: Home / Self Care | Payer: Medicare PPO | Source: Ambulatory Visit | Attending: Internal Medicine | Admitting: Internal Medicine

## 2019-09-10 DIAGNOSIS — Z1231 Encounter for screening mammogram for malignant neoplasm of breast: Secondary | ICD-10-CM | POA: Insufficient documentation

## 2019-09-10 IMAGING — MG DIGITAL SCREENING BILAT W/ TOMO W/ CAD
6 of 10 series · 6 of 30 positions shown · non-contrast
Comparison: Previous exam(s).

CLINICAL DATA: Screening.

EXAM:
DIGITAL SCREENING BILATERAL MAMMOGRAM WITH TOMO AND CAD

[L MLO synth-2D]
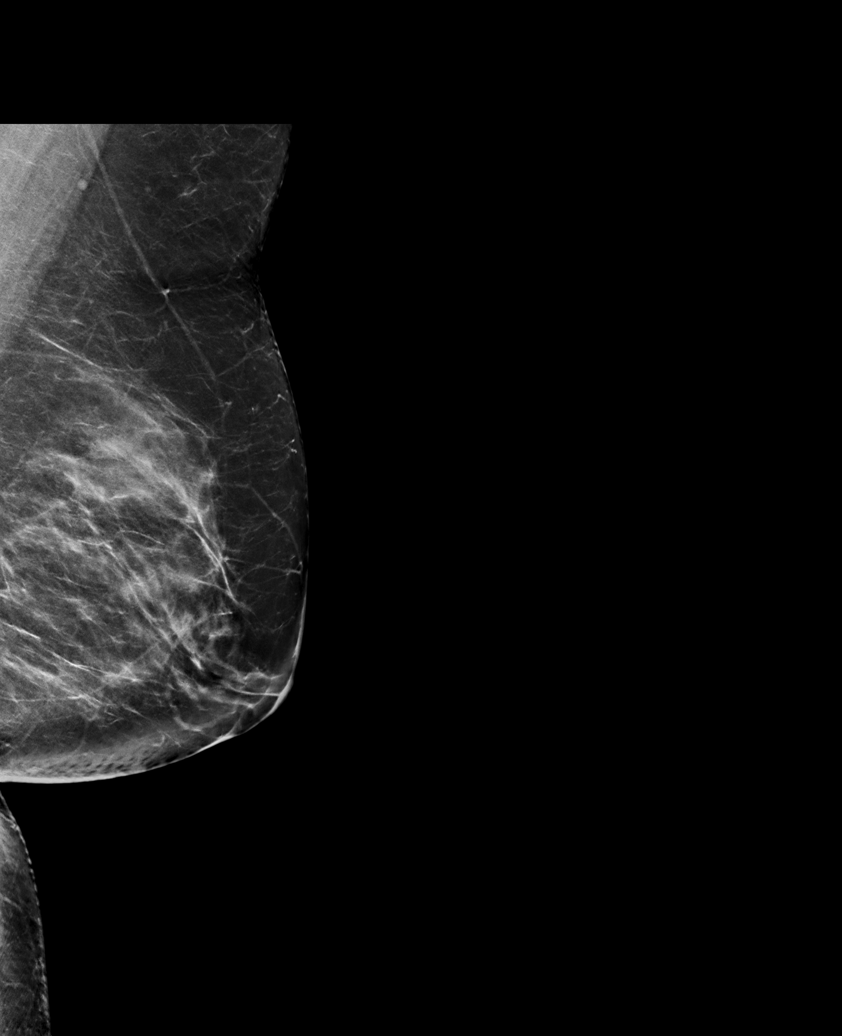

[R MLO synth-2D (1 of 2)]
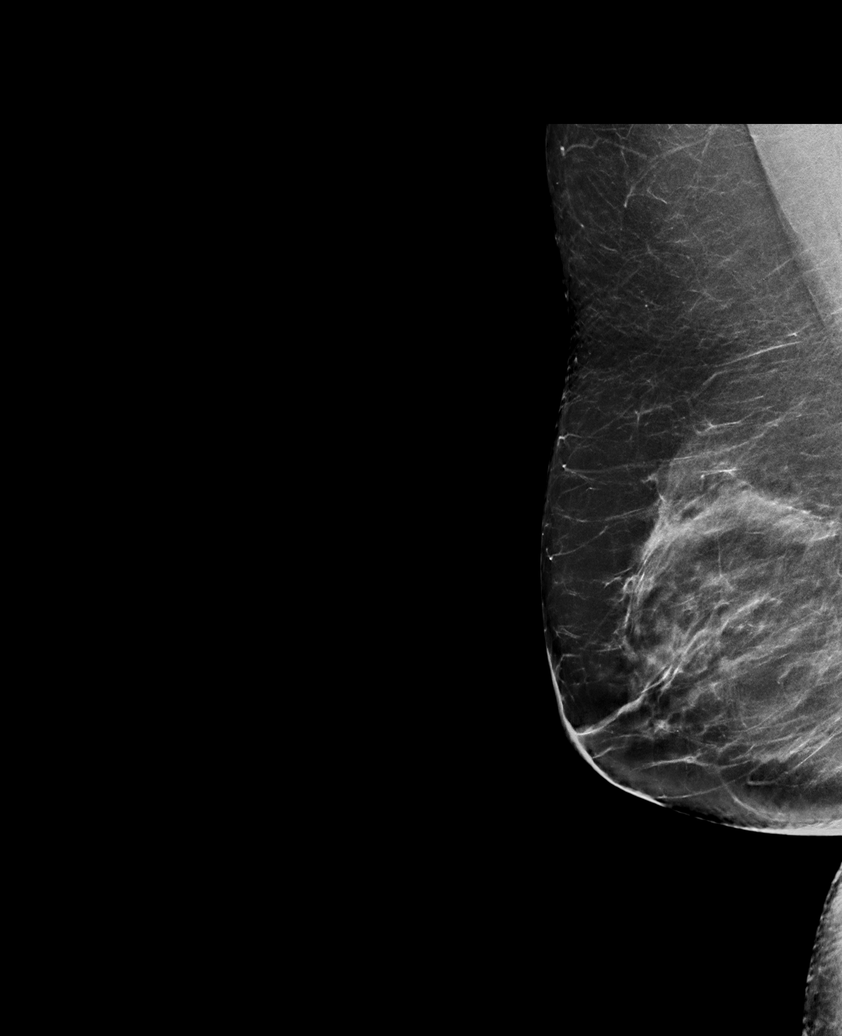

[R MLO synth-2D (2 of 2)]
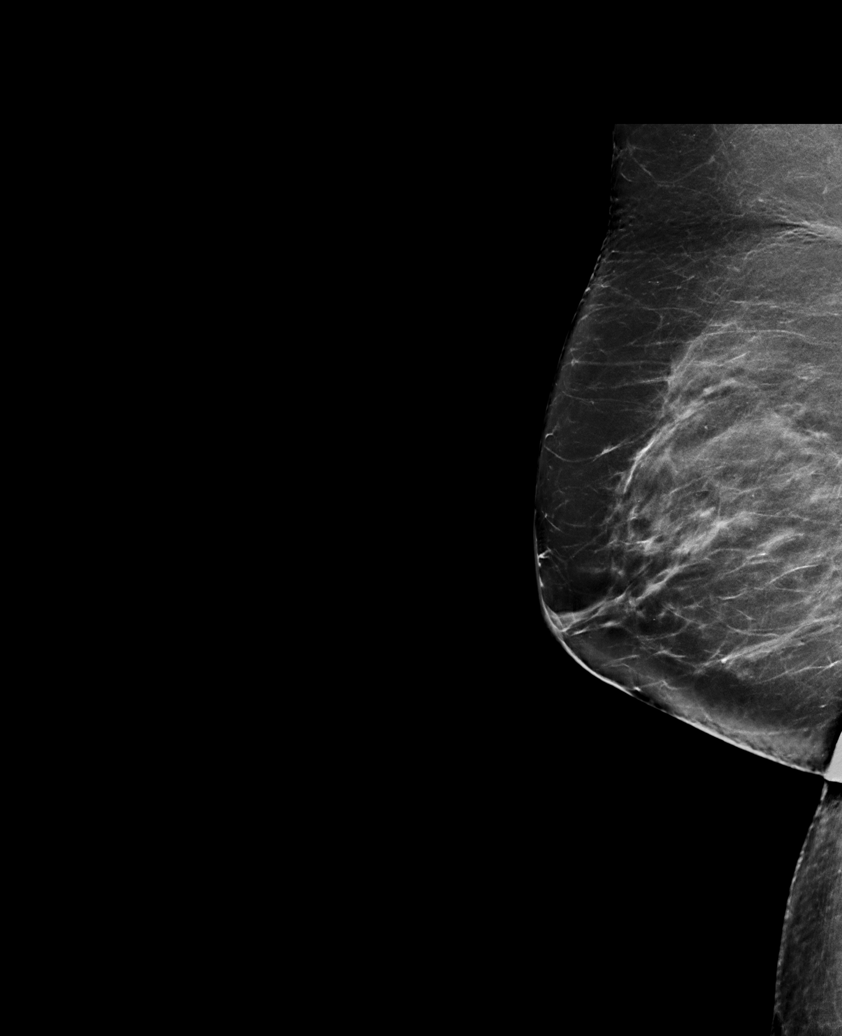

[L CC synth-2D]
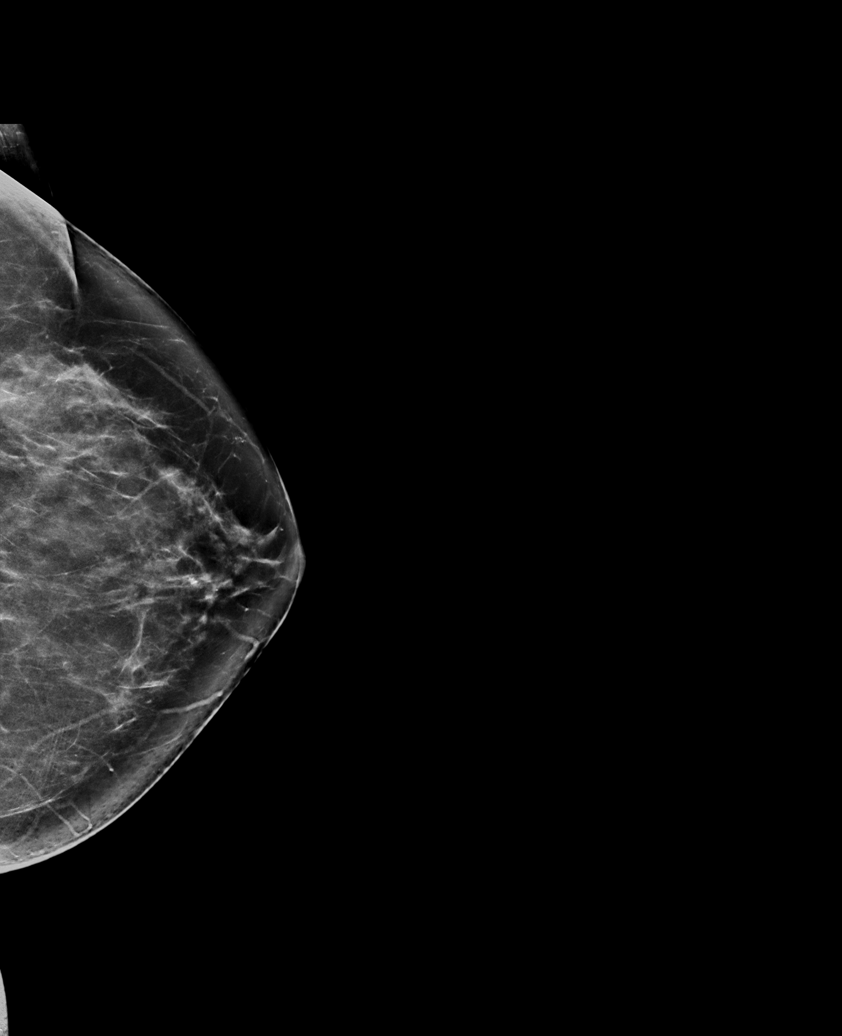

[R CC synth-2D]
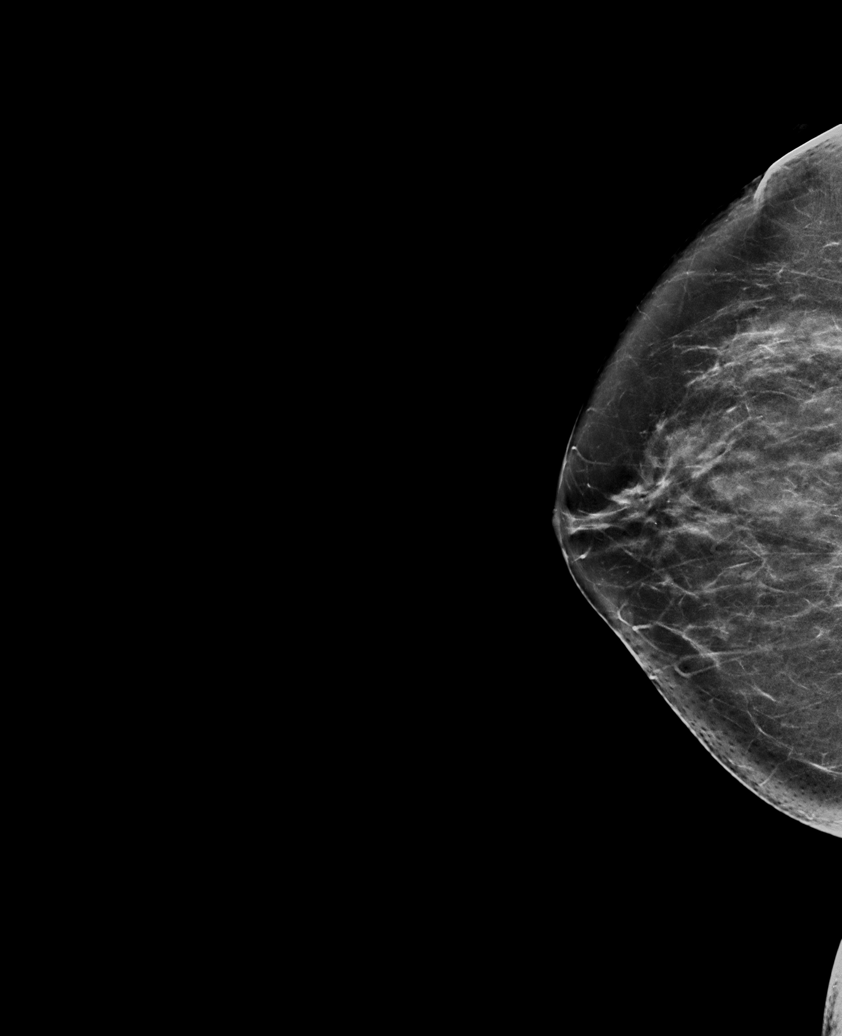

[R MLO tomo · tomo slice 42/83.0]
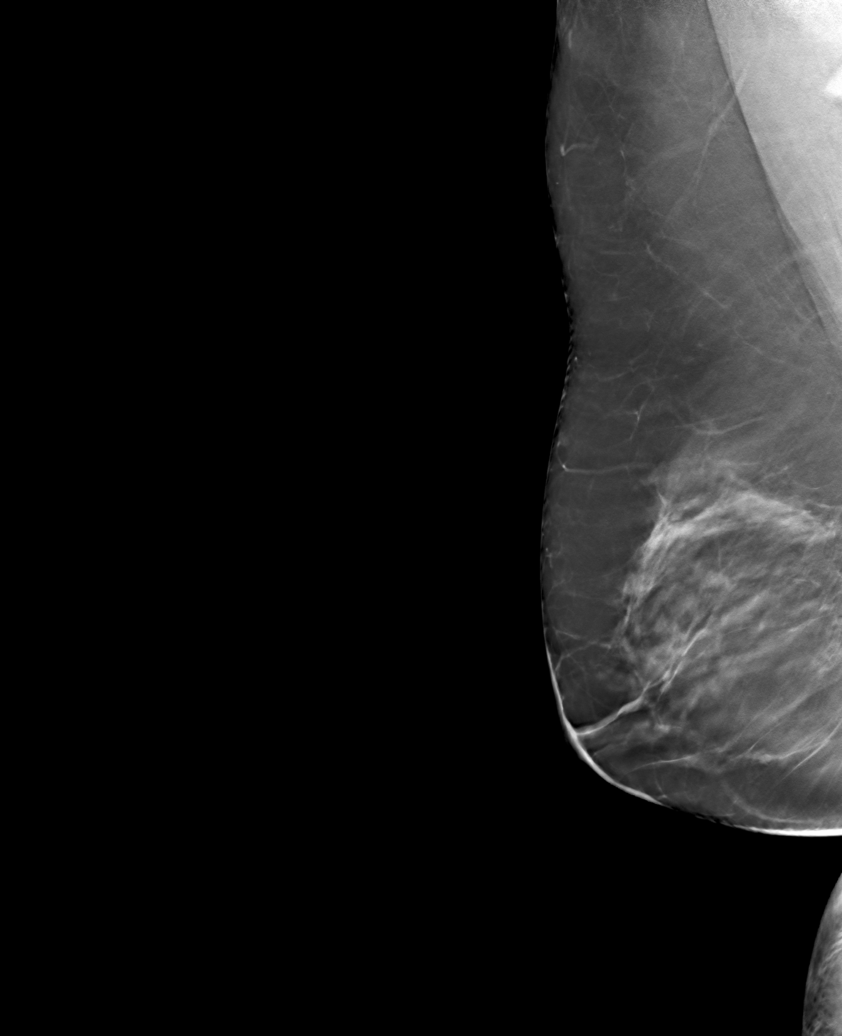

[6 of 30 positions shown; findings below may reference images not displayed]

ACR Breast Density Category c: The breast tissue is heterogeneously
dense, which may obscure small masses.
FINDINGS: There are no findings suspicious for malignancy. Images were
processed with CAD.
IMPRESSION: No mammographic evidence of malignancy. A result letter of this
screening mammogram will be mailed directly to the patient.

RECOMMENDATION:
Screening mammogram in one year. (Code:[5V])

BI-RADS CATEGORY  1: Negative.

## 2019-09-19 ENCOUNTER — Other Ambulatory Visit (INDEPENDENT_AMBULATORY_CARE_PROVIDER_SITE_OTHER): Payer: Medicare PPO

## 2019-09-19 ENCOUNTER — Other Ambulatory Visit: Payer: Self-pay

## 2019-09-19 DIAGNOSIS — I10 Essential (primary) hypertension: Secondary | ICD-10-CM | POA: Diagnosis not present

## 2019-09-19 LAB — CBC WITH DIFFERENTIAL/PLATELET
Basophils Absolute: 0.1 10*3/uL (ref 0.0–0.1)
Basophils Relative: 1.1 % (ref 0.0–3.0)
Eosinophils Absolute: 0.2 10*3/uL (ref 0.0–0.7)
Eosinophils Relative: 2.4 % (ref 0.0–5.0)
HCT: 36.7 % (ref 36.0–46.0)
Hemoglobin: 12.3 g/dL (ref 12.0–15.0)
Lymphocytes Relative: 23.6 % (ref 12.0–46.0)
Lymphs Abs: 1.7 10*3/uL (ref 0.7–4.0)
MCHC: 33.5 g/dL (ref 30.0–36.0)
MCV: 86.3 fl (ref 78.0–100.0)
Monocytes Absolute: 0.6 10*3/uL (ref 0.1–1.0)
Monocytes Relative: 8.3 % (ref 3.0–12.0)
Neutro Abs: 4.5 10*3/uL (ref 1.4–7.7)
Neutrophils Relative %: 64.6 % (ref 43.0–77.0)
Platelets: 317 10*3/uL (ref 150.0–400.0)
RBC: 4.25 Mil/uL (ref 3.87–5.11)
RDW: 12.3 % (ref 11.5–15.5)
WBC: 7 10*3/uL (ref 4.0–10.5)

## 2019-09-20 ENCOUNTER — Other Ambulatory Visit: Payer: Medicare PPO

## 2019-09-25 ENCOUNTER — Ambulatory Visit: Payer: Medicare PPO | Admitting: Internal Medicine

## 2019-09-25 ENCOUNTER — Ambulatory Visit (INDEPENDENT_AMBULATORY_CARE_PROVIDER_SITE_OTHER): Payer: Medicare PPO

## 2019-09-25 ENCOUNTER — Encounter: Payer: Self-pay | Admitting: Internal Medicine

## 2019-09-25 ENCOUNTER — Other Ambulatory Visit: Payer: Self-pay

## 2019-09-25 VITALS — BP 126/76 | HR 78 | Temp 98.3°F | Ht 64.0 in | Wt 184.2 lb

## 2019-09-25 DIAGNOSIS — N3000 Acute cystitis without hematuria: Secondary | ICD-10-CM

## 2019-09-25 DIAGNOSIS — M47816 Spondylosis without myelopathy or radiculopathy, lumbar region: Secondary | ICD-10-CM

## 2019-09-25 DIAGNOSIS — Z23 Encounter for immunization: Secondary | ICD-10-CM

## 2019-09-25 DIAGNOSIS — E669 Obesity, unspecified: Secondary | ICD-10-CM

## 2019-09-25 DIAGNOSIS — Z1329 Encounter for screening for other suspected endocrine disorder: Secondary | ICD-10-CM | POA: Diagnosis not present

## 2019-09-25 DIAGNOSIS — M25552 Pain in left hip: Secondary | ICD-10-CM | POA: Diagnosis not present

## 2019-09-25 DIAGNOSIS — E559 Vitamin D deficiency, unspecified: Secondary | ICD-10-CM

## 2019-09-25 DIAGNOSIS — I10 Essential (primary) hypertension: Secondary | ICD-10-CM

## 2019-09-25 DIAGNOSIS — R7303 Prediabetes: Secondary | ICD-10-CM | POA: Diagnosis not present

## 2019-09-25 LAB — COMPREHENSIVE METABOLIC PANEL
ALT: 22 U/L (ref 0–35)
AST: 19 U/L (ref 0–37)
Albumin: 4.4 g/dL (ref 3.5–5.2)
Alkaline Phosphatase: 69 U/L (ref 39–117)
BUN: 15 mg/dL (ref 6–23)
CO2: 29 mEq/L (ref 19–32)
Calcium: 9.5 mg/dL (ref 8.4–10.5)
Chloride: 100 mEq/L (ref 96–112)
Creatinine, Ser: 0.89 mg/dL (ref 0.40–1.20)
GFR: 62.49 mL/min (ref >60.00)
Glucose, Bld: 111 mg/dL — ABNORMAL HIGH (ref 70–99)
Potassium: 3.4 mEq/L — ABNORMAL LOW (ref 3.5–5.1)
Sodium: 138 mEq/L (ref 135–145)
Total Bilirubin: 0.6 mg/dL (ref 0.2–1.2)
Total Protein: 7.1 g/dL (ref 6.0–8.3)

## 2019-09-25 LAB — LIPID PANEL
Cholesterol: 192 mg/dL (ref 0–200)
HDL: 55 mg/dL (ref >39.00)
LDL Cholesterol: 115 mg/dL — ABNORMAL HIGH (ref 0–99)
NonHDL: 136.63
Total CHOL/HDL Ratio: 3
Triglycerides: 106 mg/dL (ref 0.0–149.0)
VLDL: 21.2 mg/dL (ref 0.0–40.0)

## 2019-09-25 LAB — VITAMIN D 25 HYDROXY (VIT D DEFICIENCY, FRACTURES): VITD: 39.98 ng/mL (ref 30.00–100.00)

## 2019-09-25 LAB — HEMOGLOBIN A1C: Hgb A1c MFr Bld: 6.3 % (ref 4.6–6.5)

## 2019-09-25 LAB — TSH: TSH: 2.5 u[IU]/mL (ref 0.35–4.50)

## 2019-09-25 IMAGING — DX DG HIP (WITH OR WITHOUT PELVIS) 2-3V*L*
2 series · 2 of 2 positions shown · non-contrast
Comparison: X-ray left hip [DATE] report without images

CLINICAL DATA: Left hip pain

EXAM:
DG HIP (WITH OR WITHOUT PELVIS) 2-3V LEFT

[pelvis standing ap]
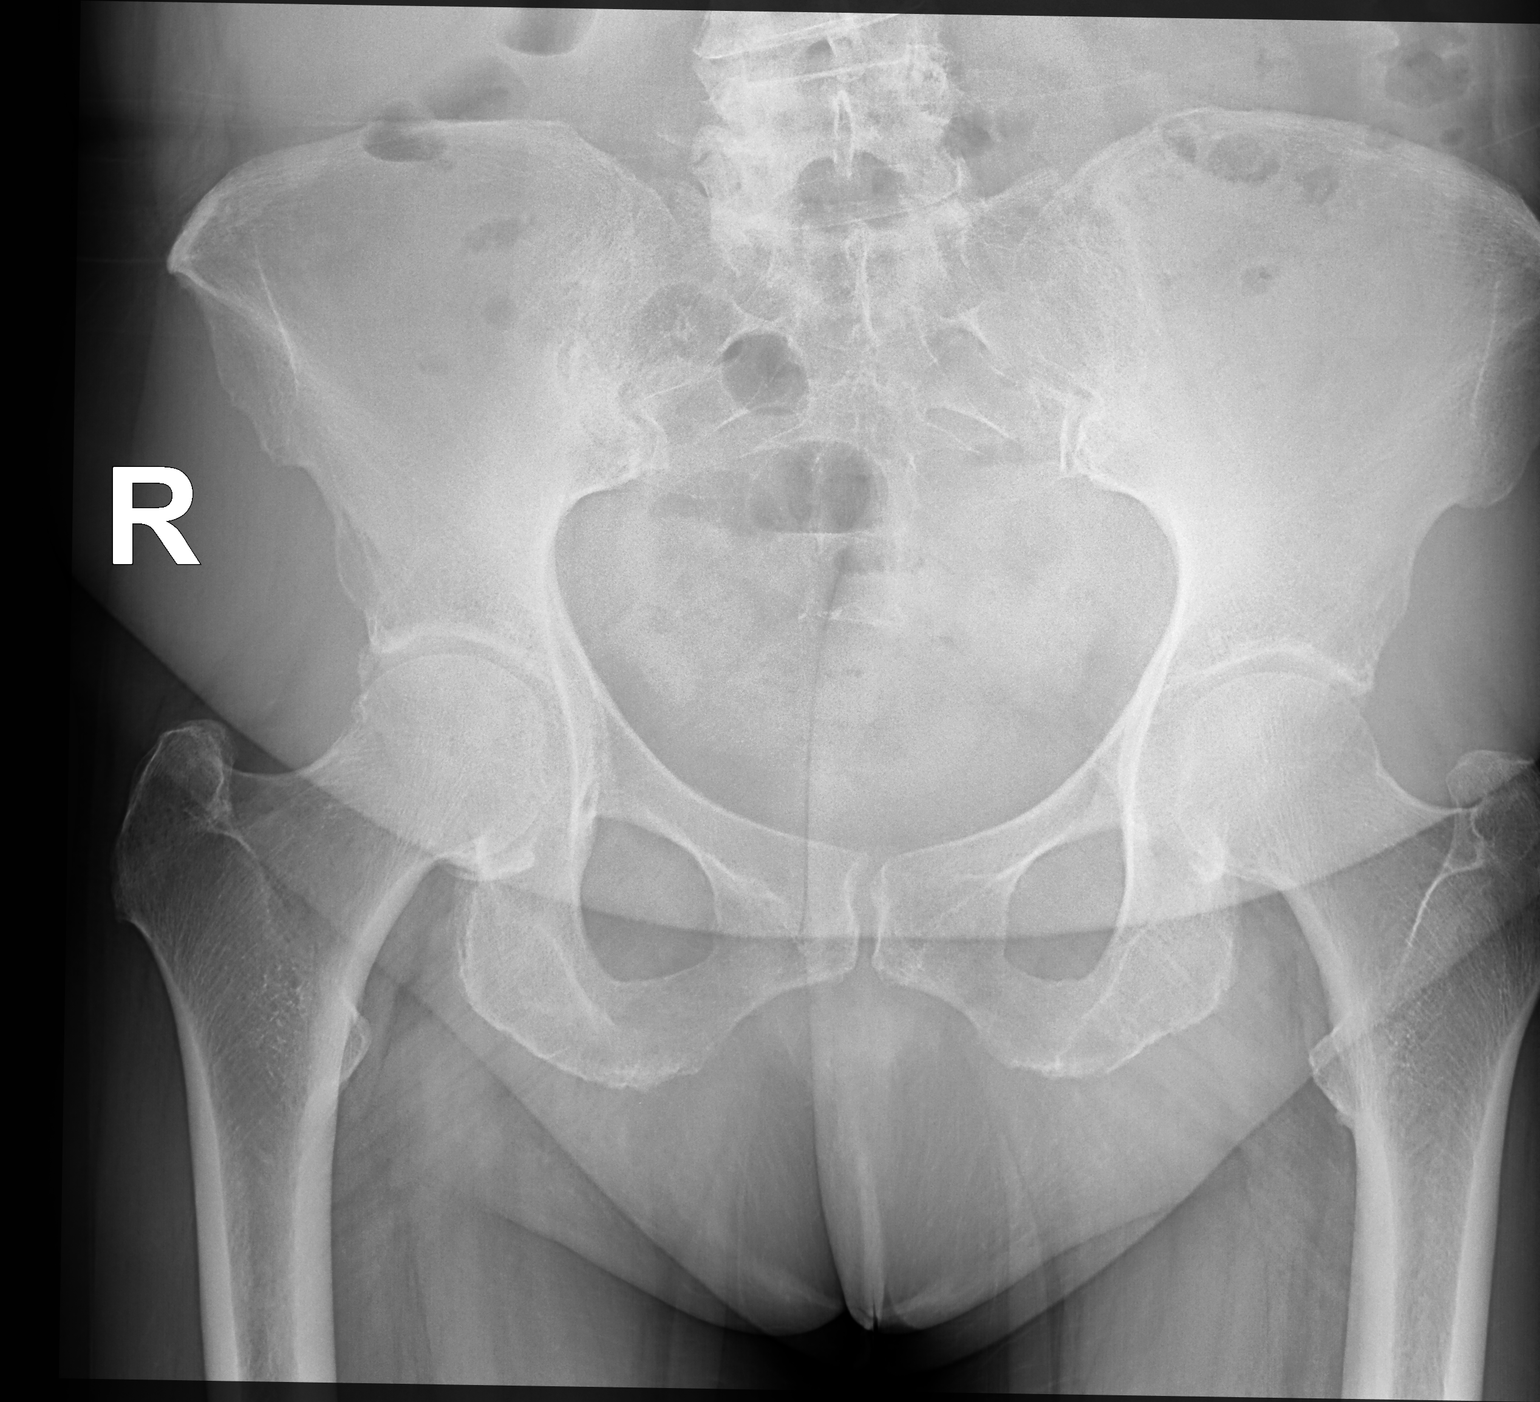

[hip joint ap]
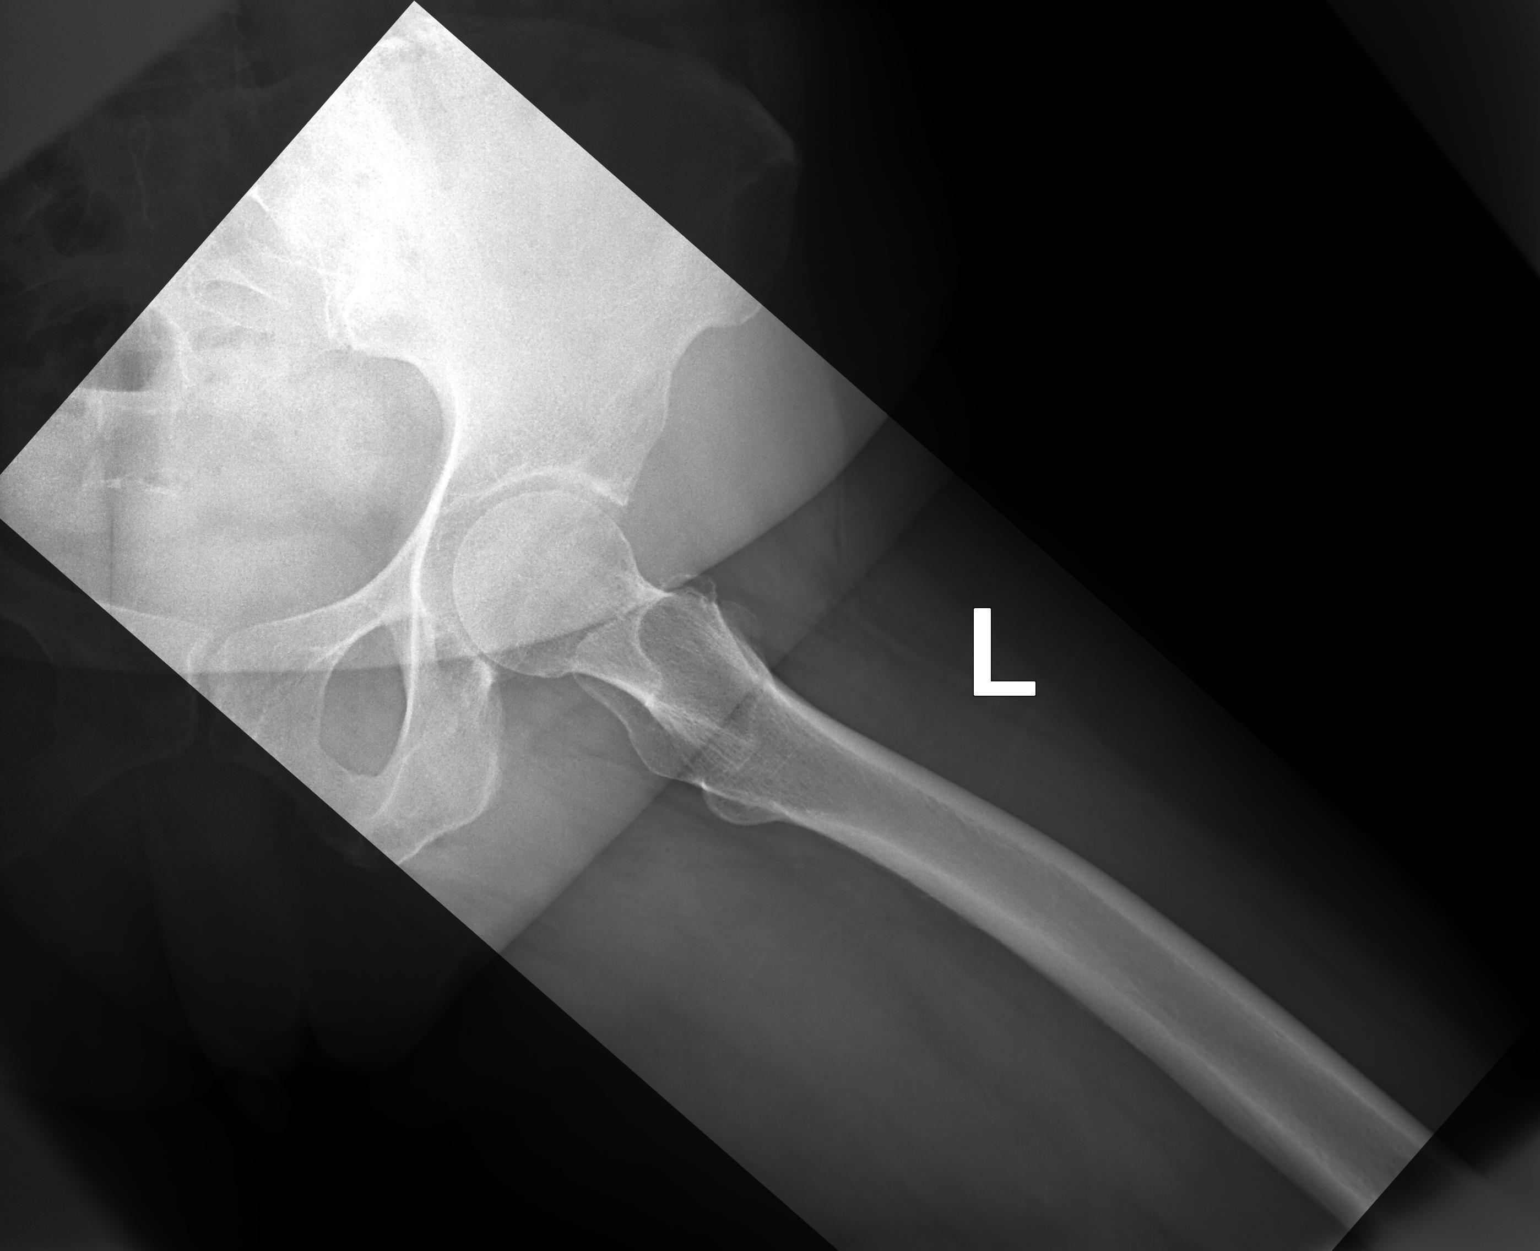

[2 of 2 positions shown; findings below may reference images not displayed]

FINDINGS: There is no evidence of left hip fracture or dislocation. Frontal
view of the bones of the pelvis and right hip are unremarkable.
Visualized lower lumbar spine demonstrates degenerative changes.
There is no evidence of arthropathy or other focal bone abnormality.
IMPRESSION: No acute fracture or dislocation of the left hip.

Degenerative changes of the visualized lumbosacral spine.

## 2019-09-25 NOTE — Progress Notes (Signed)
Chief Complaint  Patient presents with  . Follow-up  . Immunizations    flu shot high dose   F/u  1. bp controlled on lis hct 20-12.5 mg qd  2. Agreeable flu shot today  3. C/o 6/10 left hip pain worse with walking and  Climbing steps nothing tried h/o bursitis in the past saw [facility]   Doing well 1 grandson at [employer]  and another in 7th grade who she helps with his home schooling     Review of Systems  Constitutional: Negative for weight loss.  HENT: Negative for hearing loss.   Eyes: Negative for blurred vision.  Respiratory: Negative for shortness of breath.   Cardiovascular: Negative for chest pain.  Gastrointestinal: Negative for abdominal pain.  Musculoskeletal: Positive for joint pain.  Skin: Negative for rash.  Neurological: Negative for headaches.  Psychiatric/Behavioral: Negative for depression.   Past Medical History:  Diagnosis Date  . Allergy    i.e contact allergies [place] [facility] also plants, trees   . Chicken pox   . Depression   . Family history of breast cancer    BRCA negative in the past  . Family history of breast cancer   . Family history of lung cancer   . Family history of rectal cancer   . History of prediabetes   . Hypertension   . Interstitial cystitis   . Interstitial cystitis   . Prediabetes    A1C 6.3 06/14/16  . Spinal stenosis   . UTI (urinary tract infection)   . UTI (urinary tract infection)    Past Surgical History:  Procedure Laterality Date  . denies     denies psuH   Family History  Problem Relation Age of Onset  . Breast cancer Mother 14       s/p b/l masectomy   . Breast cancer Sister 2  . Breast cancer Maternal Aunt   . Breast cancer Maternal Grandmother   . Stroke Father        age 73   . Stroke Other        grandmother age 81   . Lupus Other    Social History   Socioeconomic History  . Marital status: Widowed    Spouse name: Not on file  . Number of children: Not on file  . Years of  education: Not on file  . Highest education level: Not on file  Occupational History  . Not on file  Tobacco Use  . Smoking status: Former Games developer  . Smokeless tobacco: Never Used  . Tobacco comment: smoked 4-5 years in college then quit  Substance and Sexual Activity  . Alcohol use: No  . Drug use: No  . Sexual activity: Not on file  Other Topics Concern  . Not on file  Social History Narrative   1 daughter and 1 son    3 grandsons    Married to high school sweet heart (husband) x 47 years dx'ed with prostate cancer in 06-Jun-2015 he died 06-05-17    Retired Scientist, forensic education    Social Determinants of Health   Financial Resource Strain: Low Risk   . Difficulty of Paying Living Expenses: Not hard at all  Food Insecurity: No Food Insecurity  . Worried About Programme researcher, broadcasting/film/video in the Last Year: Never true  . Ran Out of Food in the Last Year: Never true  Transportation Needs: No Transportation Needs  . Lack of Transportation (Medical): No  .  Lack of Transportation (Non-Medical): No  Physical Activity:   . Days of Exercise per Week: Not on file  . Minutes of Exercise per Session: Not on file  Stress: No Stress Concern Present  . Feeling of Stress : Not at all  Social Connections: Unknown  . Frequency of Communication with Friends and Family: More than three times a week  . Frequency of Social Gatherings with Friends and Family: More than three times a week  . Attends Religious Services: Not on file  . Active Member of Clubs or Organizations: Yes  . Attends Archivist Meetings: 1 to 4 times per year  . Marital Status: Married  Human resources officer Violence: Not At Risk  . Fear of Current or Ex-Partner: No  . Emotionally Abused: No  . Physically Abused: No  . Sexually Abused: No   Current Meds  Medication Sig  . Azelastine-Fluticasone 137-50 MCG/ACT SUSP 1 spray bid  . cetirizine (ZYRTEC) 10 MG tablet Take by mouth.  . Cholecalciferol (VITAMIN D3 PO) Take 5,000  Units by mouth.  Marland Kitchen lisinopril-hydrochlorothiazide (ZESTORETIC) 20-12.5 MG tablet Take 1 tablet by mouth daily.  . Multiple Vitamin (MULTIVITAMIN) capsule Take 1 capsule by mouth daily.  . Olopatadine HCl 0.2 % SOLN One drop to each eye daily  . traZODone (DESYREL) 100 MG tablet Take 1 tablet (100 mg total) by mouth at bedtime as needed for sleep.   Allergies  Allergen Reactions  . Benzalkonium Chloride Rash  . Cetyl Alcohol Rash  . Codeine Rash  . Lanolin Rash  . Neomycin Rash  . Nickel Rash  . Propylene Glycol Rash   Recent Results (from the past 2160 hour(s))  CBC with Differential/Platelet     Status: None   Collection Time: 09/19/19 11:22 AM  Result Value Ref Range   WBC 7.0 4.0 - 10.5 K/uL   RBC 4.25 3.87 - 5.11 Mil/uL   Hemoglobin 12.3 12.0 - 15.0 g/dL   HCT 36.7 36 - 46 %   MCV 86.3 78.0 - 100.0 fl   MCHC 33.5 30.0 - 36.0 g/dL   RDW 12.3 11.5 - 15.5 %   Platelets 317.0 150 - 400 K/uL   Neutrophils Relative % 64.6 43 - 77 %   Lymphocytes Relative 23.6 12 - 46 %   Monocytes Relative 8.3 3 - 12 %   Eosinophils Relative 2.4 0 - 5 %   Basophils Relative 1.1 0 - 3 %   Neutro Abs 4.5 1.4 - 7.7 K/uL   Lymphs Abs 1.7 0.7 - 4.0 K/uL   Monocytes Absolute 0.6 0 - 1 K/uL   Eosinophils Absolute 0.2 0 - 0 K/uL   Basophils Absolute 0.1 0 - 0 K/uL   Objective  Body mass index is 31.62 kg/m. Wt Readings from Last 3 Encounters:  09/25/19 184 lb 3.2 oz (83.6 kg)  03/14/19 181 lb (82.1 kg)  02/19/19 181 lb (82.1 kg)   Temp Readings from Last 3 Encounters:  09/25/19 98.3 F (36.8 C) (Oral)  03/01/18 98.6 F (37 C) (Oral)  09/13/17 98.6 F (37 C) (Oral)   BP Readings from Last 3 Encounters:  09/25/19 126/76  03/01/18 132/60  09/13/17 106/62   Pulse Readings from Last 3 Encounters:  09/25/19 78  03/01/18 81  09/13/17 67    Physical Exam Vitals and nursing note reviewed.  Constitutional:      Appearance: Normal appearance. She is well-developed and well-groomed. She  is obese.  HENT:     Head: Normocephalic  and atraumatic.  Eyes:     Conjunctiva/sclera: Conjunctivae normal.     Pupils: Pupils are equal, round, and reactive to light.  Cardiovascular:     Rate and Rhythm: Normal rate and regular rhythm.     Heart sounds: No murmur heard.   Pulmonary:     Effort: Pulmonary effort is normal.     Breath sounds: Normal breath sounds.  Abdominal:     General: Abdomen is flat. Bowel sounds are normal.     Tenderness: There is no abdominal tenderness.  Skin:    General: Skin is warm and dry.  Neurological:     General: No focal deficit present.     Mental Status: She is alert and oriented to person, place, and time. Mental status is at baseline.     Gait: Gait normal.  Psychiatric:        Attention and Perception: Attention and perception normal.        Mood and Affect: Mood and affect normal.        Speech: Speech normal.        Behavior: Behavior normal. Behavior is cooperative.        Thought Content: Thought content normal.        Cognition and Memory: Cognition and memory normal.        Judgment: Judgment normal.     Assessment  Plan  Essential hypertension - Plan: Comprehensive metabolic panel, Lipid panel Cont meds controlled   Acute cystitis without hematuria - Plan: Urinalysis, Routine w reflex microscopic W/o sx's today   Prediabetes - Plan: Hemoglobin A1c  Vitamin D deficiency - Plan: Vitamin D (25 hydroxy)  Left hip pain - Plan: DG Hip Unilat W OR W/O Pelvis 2-3 Views Left Consider [place] [facility] based on Xray   Obesity (BMI 30-39.9)  Healthy diet and exercise   HM Flu utdgiven today  Tdap had 07/22/15 pna 23 11/20/14 prevnarutd Consider shingrix in future 2/2/ covid 19 vaccines Hep Bimmune, MMR immune D3 5000 IU daily  Last pap 07/22/15 negative HPV neg out of age window  Mammogram8/24/21 neg 07/31/18 genetics test + sister and 1/2 tested +nieces   Pt wants to hold on dexa for now 09/25/19 would not wants meds if  abnormal due to side effects concern  -get copy [place] or Dr. Cherylann Banas Ob/GYN cologaurdneg 12/30/16  [facility]2017 neg f/u in 1 yearsaw in 07/2018 and f/u in 1 year saw 07/2019 Hassan Rowan normal no bxs/lng - h/o eczema has steroid cream to use     Provider: Dr. Olivia Mackie McLean-Scocuzza-[facility]

## 2019-09-25 NOTE — Patient Instructions (Addendum)
cologuard due 12/31/19 call me back in nov 2021  Vitamin D3 4000 IU daily calcium 600 mg 1-2 x per day total 1200 daily calcium    Voltaren gel 4x per day   Hip Exercises Ask your health care provider which exercises are safe for you. Do exercises exactly as told by your health care provider and adjust them as directed. It is normal to feel mild stretching, pulling, tightness, or discomfort as you do these exercises. Stop right away if you feel sudden pain or your pain gets worse. Do not begin these exercises until told by your health care provider. Stretching and range-of-motion exercises These exercises warm up your muscles and joints and improve the movement and flexibility of your hip. These exercises also help to relieve pain, numbness, and tingling. You may be asked to limit your range of motion if you had a hip replacement. Talk to your health care provider about these restrictions. Hamstrings, supine  1. Lie on your back (supine position). 2. Loop a belt or towel over the ball of your left / right foot. The ball of your foot is on the walking surface, right under your toes. 3. Straighten your left / right knee and slowly pull on the belt or towel to raise your leg until you feel a gentle stretch behind your knee (hamstring). ? Do not let your knee bend while you do this. ? Keep your other leg flat on the floor. 4. Hold this position for __________ seconds. 5. Slowly return your leg to the starting position. Repeat __________ times. Complete this exercise __________ times a day. Hip rotation  1. Lie on your back on a firm surface. 2. With your left / right hand, gently pull your left / right knee toward the shoulder that is on the same side of the body. Stop when your knee is pointing toward the ceiling. 3. Hold your left / right ankle with your other hand. 4. Keeping your knee steady, gently pull your left / right ankle toward your other shoulder until you feel a stretch in your  buttocks. ? Keep your hips and shoulders firmly planted while you do this stretch. 5. Hold this position for __________ seconds. Repeat __________ times. Complete this exercise __________ times a day. Seated stretch This exercise is sometimes called hamstrings and adductors stretch. 1. Sit on the floor with your legs stretched wide. Keep your knees straight during this exercise. 2. Keeping your head and back in a straight line, bend at your waist to reach for your left foot (position A). You should feel a stretch in your right inner thigh (adductors). 3. Hold this position for __________ seconds. Then slowly return to the upright position. 4. Keeping your head and back in a straight line, bend at your waist to reach forward (position B). You should feel a stretch behind both of your thighs and knees (hamstrings). 5. Hold this position for __________ seconds. Then slowly return to the upright position. 6. Keeping your head and back in a straight line, bend at your waist to reach for your right foot (position C). You should feel a stretch in your left inner thigh (adductors). 7. Hold this position for __________ seconds. Then slowly return to the upright position. Repeat __________ times. Complete this exercise __________ times a day. Lunge This exercise stretches the muscles of the hip (hip flexors). 1. Place your left / right knee on the floor and bend your other knee so that is directly over your ankle. You  should be half-kneeling. 2. Keep good posture with your head over your shoulders. 3. Tighten your buttocks to point your tailbone downward. This will prevent your back from arching too much. 4. You should feel a gentle stretch in the front of your left / right thigh and hip. If you do not feel a stretch, slide your other foot forward slightly and then slowly lunge forward with your chest up until your knee once again lines up over your ankle. ? Make sure your tailbone continues to point  downward. 5. Hold this position for __________ seconds. 6. Slowly return to the starting position. Repeat __________ times. Complete this exercise __________ times a day. Strengthening exercises These exercises build strength and endurance in your hip. Endurance is the ability to use your muscles for a long time, even after they get tired. Bridge This exercise strengthens the muscles of your hip (hip extensors). 1. Lie on your back on a firm surface with your knees bent and your feet flat on the floor. 2. Tighten your buttocks muscles and lift your bottom off the floor until the trunk of your body and your hips are level with your thighs. ? Do not arch your back. ? You should feel the muscles working in your buttocks and the back of your thighs. If you do not feel these muscles, slide your feet 1-2 inches (2.5-5 cm) farther away from your buttocks. 3. Hold this position for __________ seconds. 4. Slowly lower your hips to the starting position. 5. Let your muscles relax completely between repetitions. Repeat __________ times. Complete this exercise __________ times a day. Straight leg raises, side-lying This exercise strengthens the muscles that move the hip joint away from the center of the body (hip abductors). 1. Lie on your side with your left / right leg in the top position. Lie so your head, shoulder, hip, and knee line up. You may bend your bottom knee slightly to help you balance. 2. Roll your hips slightly forward, so your hips are stacked directly over each other and your left / right knee is facing forward. 3. Leading with your heel, lift your top leg 4-6 inches (10-15 cm). You should feel the muscles in your top hip lifting. ? Do not let your foot drift forward. ? Do not let your knee roll toward the ceiling. 4. Hold this position for __________ seconds. 5. Slowly return to the starting position. 6. Let your muscles relax completely between repetitions. Repeat __________ times.  Complete this exercise __________ times a day. Straight leg raises, side-lying This exercise strengthens the muscles that move the hip joint toward the center of the body (hip adductors). 1. Lie on your side with your left / right leg in the bottom position. Lie so your head, shoulder, hip, and knee line up. You may place your upper foot in front to help you balance. 2. Roll your hips slightly forward, so your hips are stacked directly over each other and your left / right knee is facing forward. 3. Tense the muscles in your inner thigh and lift your bottom leg 4-6 inches (10-15 cm). 4. Hold this position for __________ seconds. 5. Slowly return to the starting position. 6. Let your muscles relax completely between repetitions. Repeat __________ times. Complete this exercise __________ times a day. Straight leg raises, supine This exercise strengthens the muscles in the front of your thigh (quadriceps). 1. Lie on your back (supine position) with your left / right leg extended and your other knee bent. 2.  Tense the muscles in the front of your left / right thigh. You should see your kneecap slide up or see increased dimpling just above your knee. 3. Keep these muscles tight as you raise your leg 4-6 inches (10-15 cm) off the floor. Do not let your knee bend. 4. Hold this position for __________ seconds. 5. Keep these muscles tense as you lower your leg. 6. Relax the muscles slowly and completely between repetitions. Repeat __________ times. Complete this exercise __________ times a day. Hip abductors, standing This exercise strengthens the muscles that move the leg and hip joint away from the center of the body (hip abductors). 1. Tie one end of a rubber exercise band or tubing to a secure surface, such as a chair, table, or pole. 2. Loop the other end of the band or tubing around your left / right ankle. 3. Keeping your ankle with the band or tubing directly opposite the secured end, step away  until there is tension in the tubing or band. Hold on to a chair, table, or pole as needed for balance. 4. Lift your left / right leg out to your side. While you do this: ? Keep your back upright. ? Keep your shoulders over your hips. ? Keep your toes pointing forward. ? Make sure to use your hip muscles to slowly lift your leg. Do not tip your body or forcefully lift your leg. 5. Hold this position for __________ seconds. 6. Slowly return to the starting position. Repeat __________ times. Complete this exercise __________ times a day. Squats This exercise strengthens the muscles in the front of your thigh (quadriceps). 1. Stand in a door frame so your feet and knees are in line with the frame. You may place your hands on the frame for balance. 2. Slowly bend your knees and lower your hips like you are going to sit in a chair. ? Keep your lower legs in a straight-up-and-down position. ? Do not let your hips go lower than your knees. ? Do not bend your knees lower than told by your health care provider. ? If your hip pain increases, do not bend as low. 3. Hold this position for ___________ seconds. 4. Slowly push with your legs to return to standing. Do not use your hands to pull yourself to standing. Repeat __________ times. Complete this exercise __________ times a day. This information is not intended to replace advice given to you by your health care provider. Make sure you discuss any questions you have with your health care provider. Document Revised: 08/09/2018 Document Reviewed: 11/14/2017 Elsevier Patient Education  2020 [employer].

## 2019-09-26 LAB — URINALYSIS, ROUTINE W REFLEX MICROSCOPIC
Bacteria, UA: NONE SEEN /HPF
Bilirubin Urine: NEGATIVE
Glucose, UA: NEGATIVE
Hgb urine dipstick: NEGATIVE
Hyaline Cast: NONE SEEN /LPF
Ketones, ur: NEGATIVE
Nitrite: NEGATIVE
Protein, ur: NEGATIVE
RBC / HPF: NONE SEEN /HPF (ref 0–2)
Specific Gravity, Urine: 1.016 (ref 1.001–1.03)
pH: <=5 (ref 5.0–8.0)

## 2019-09-29 DIAGNOSIS — M47816 Spondylosis without myelopathy or radiculopathy, lumbar region: Secondary | ICD-10-CM | POA: Insufficient documentation

## 2019-09-29 DIAGNOSIS — M25552 Pain in left hip: Secondary | ICD-10-CM | POA: Insufficient documentation

## 2019-09-29 NOTE — Addendum Note (Signed)
Addended by: Quentin Ore on: 09/29/2019 11:34 PM   Modules accepted: Orders

## 2019-10-28 DIAGNOSIS — M5442 Lumbago with sciatica, left side: Secondary | ICD-10-CM | POA: Diagnosis not present

## 2019-10-28 DIAGNOSIS — G8929 Other chronic pain: Secondary | ICD-10-CM | POA: Diagnosis not present

## 2019-10-28 DIAGNOSIS — M47817 Spondylosis without myelopathy or radiculopathy, lumbosacral region: Secondary | ICD-10-CM | POA: Diagnosis not present

## 2019-10-29 ENCOUNTER — Other Ambulatory Visit: Payer: Self-pay | Admitting: Surgery

## 2019-10-29 DIAGNOSIS — G8929 Other chronic pain: Secondary | ICD-10-CM

## 2019-10-29 DIAGNOSIS — M5442 Lumbago with sciatica, left side: Secondary | ICD-10-CM

## 2019-11-12 ENCOUNTER — Other Ambulatory Visit: Payer: Self-pay

## 2019-11-12 ENCOUNTER — Ambulatory Visit
Admission: RE | Admit: 2019-11-12 | Discharge: 2019-11-12 | Disposition: A | Discharge status: Home / Self Care | Payer: Medicare PPO | Source: Ambulatory Visit | Attending: Surgery | Admitting: Surgery

## 2019-11-12 DIAGNOSIS — M5442 Lumbago with sciatica, left side: Secondary | ICD-10-CM | POA: Diagnosis not present

## 2019-11-12 DIAGNOSIS — G8929 Other chronic pain: Secondary | ICD-10-CM | POA: Insufficient documentation

## 2019-11-12 DIAGNOSIS — M545 Low back pain, unspecified: Secondary | ICD-10-CM | POA: Diagnosis not present

## 2019-11-12 IMAGING — MR MR LUMBAR SPINE W/O CM
5 series · 31 of 48 positions shown · non-contrast
Comparison: None.

CLINICAL DATA: Back and bilateral hip pain.

EXAM:
MRI LUMBAR SPINE WITHOUT CONTRAST
TECHNIQUE: Multiplanar, multisequence MR imaging of the lumbar spine was
performed. No intravenous contrast was administered.

[Series 5: T2 · sagittal · 4.0mm · 0.81mm/px · 6 of 17 slices shown (1 of 2)]
[im 1/17]
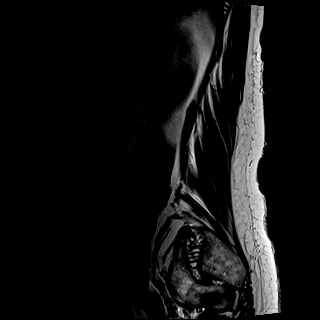
[im 4/17]
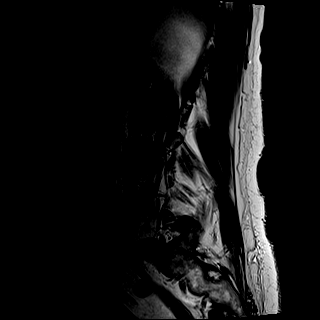
[im 7/17]
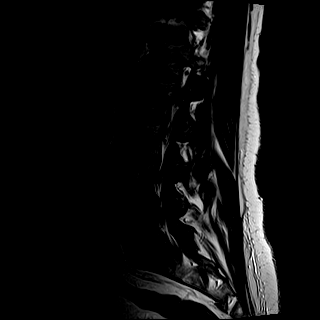
[im 10/17]
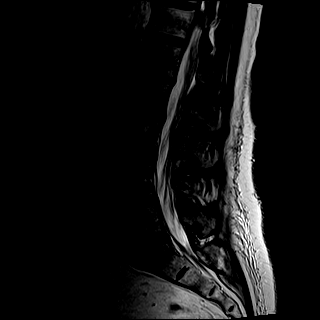
[im 13/17]
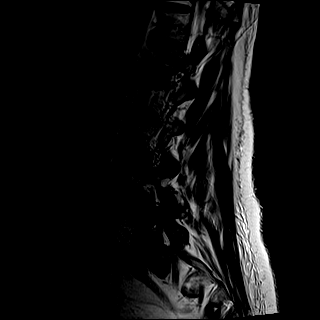
[im 17/17]
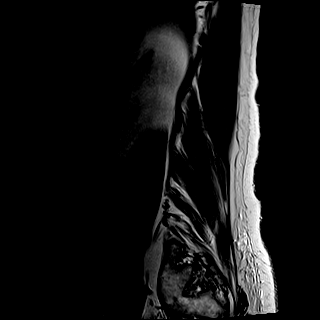

[Series 6: T1 · sagittal · 4.0mm · 0.81mm/px · 7 of 17 slices shown (1 of 2)]
[im 1/17]
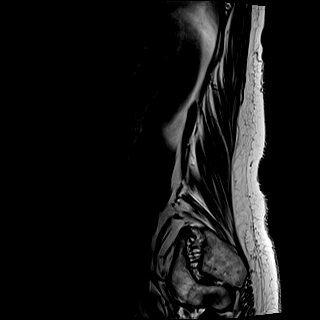
[im 3/17]
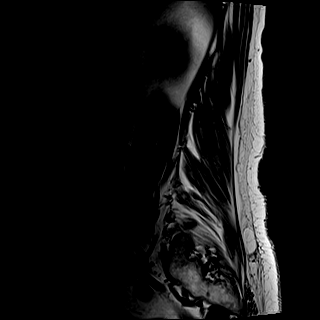
[im 6/17]
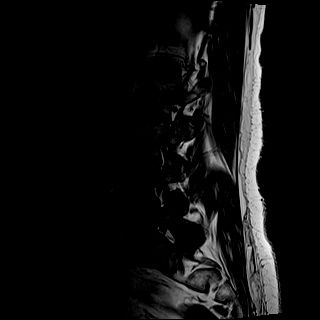
[im 9/17]
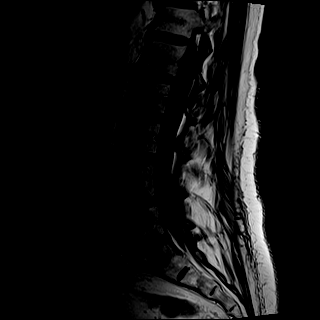
[im 11/17]
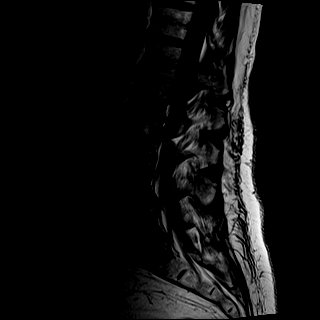
[im 14/17]
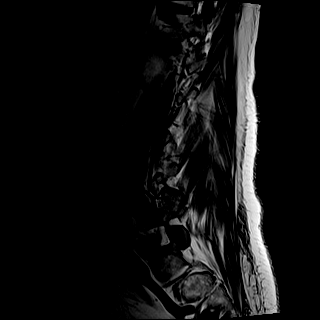
[im 17/17]
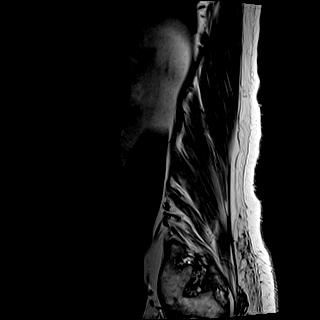

[Series 7: STIR · sagittal · 4.0mm · 0.41mm/px · 2 of 17 slices shown]
[im 1/17]
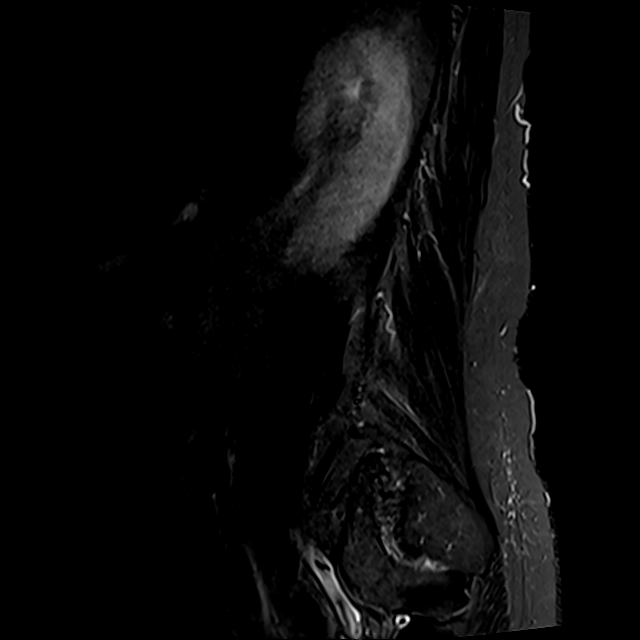
[im 3/17]
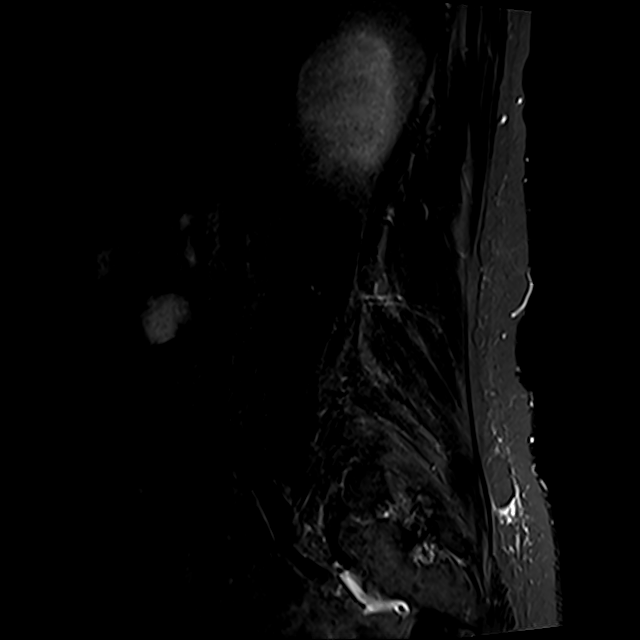

[Series 8: T2 · axial · 4.0mm · 0.78mm/px · z∈[-25,+207]mm · 8 of 35 slices shown (2 of 2)]
[im 1/35]
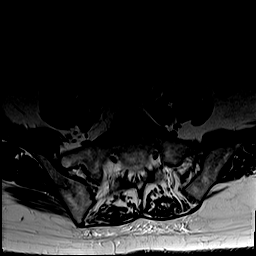
[im 6/35]
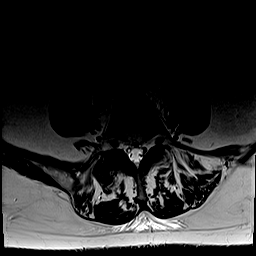
[im 11/35]
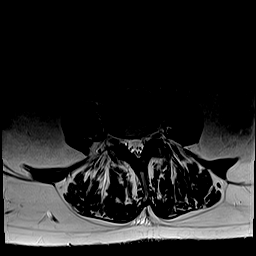
[im 16/35]
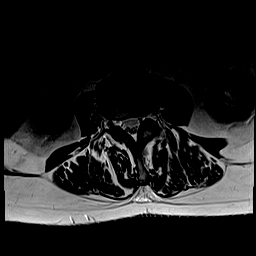
[im 19/35]
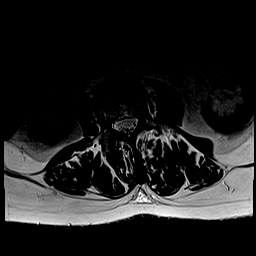
[im 24/35]
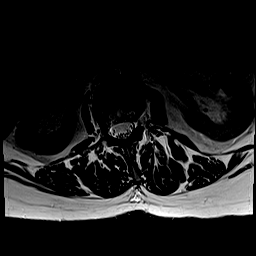
[im 29/35]
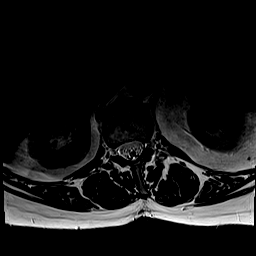
[im 35/35]
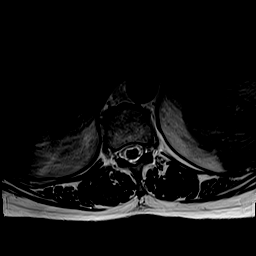

[Series 9: T1 · axial · 4.0mm · 0.39mm/px · z∈[-25,+207]mm · 8 of 35 slices shown (2 of 2)]
[im 1/35]
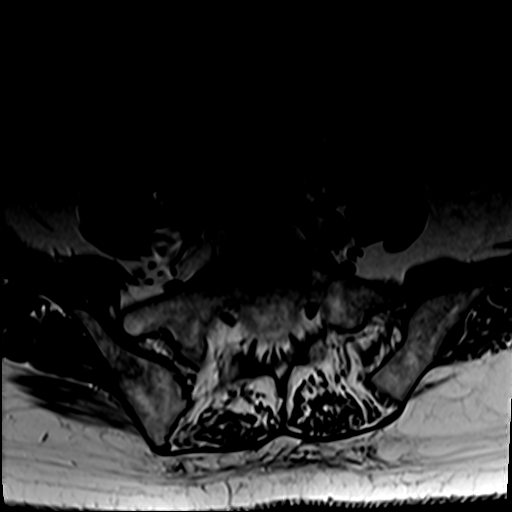
[im 6/35]
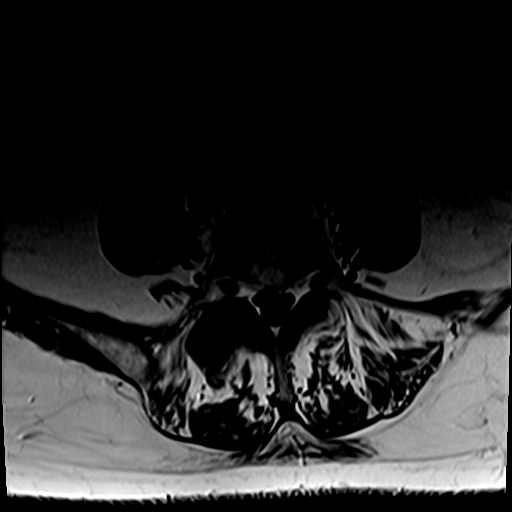
[im 11/35]
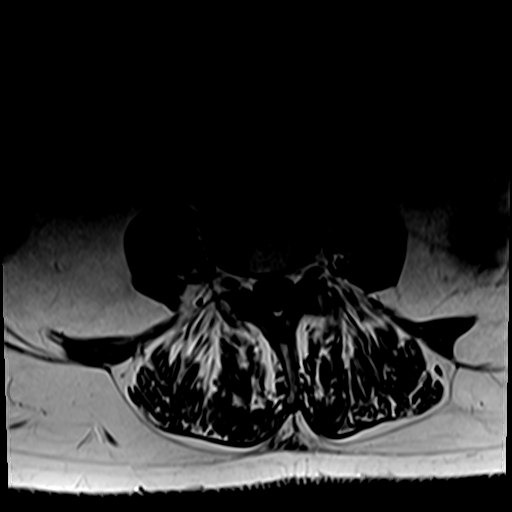
[im 16/35]
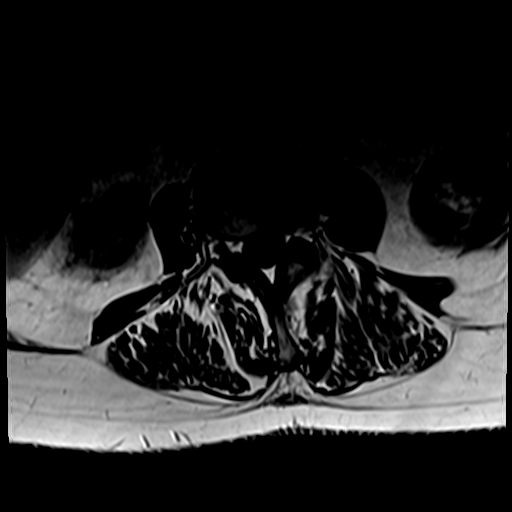
[im 19/35]
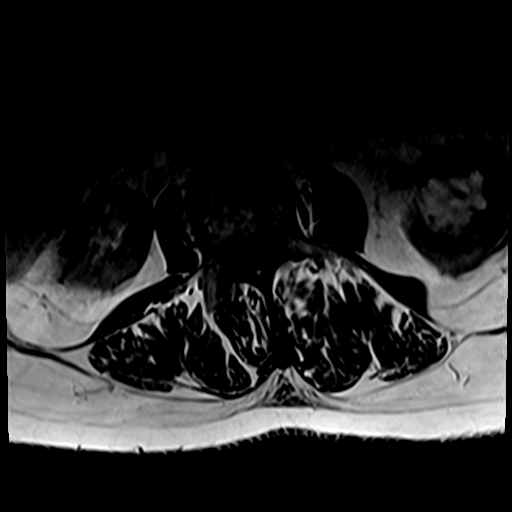
[im 24/35]
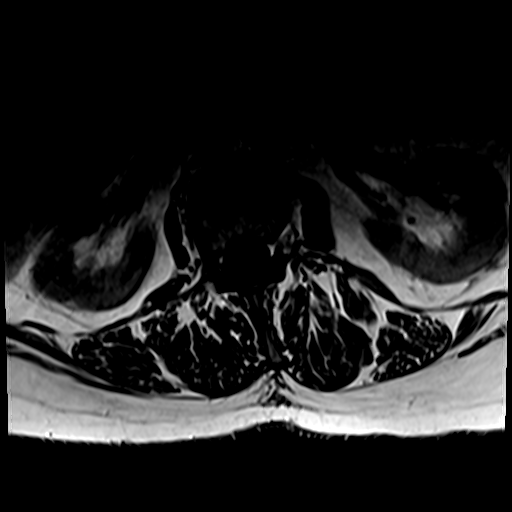
[im 29/35]
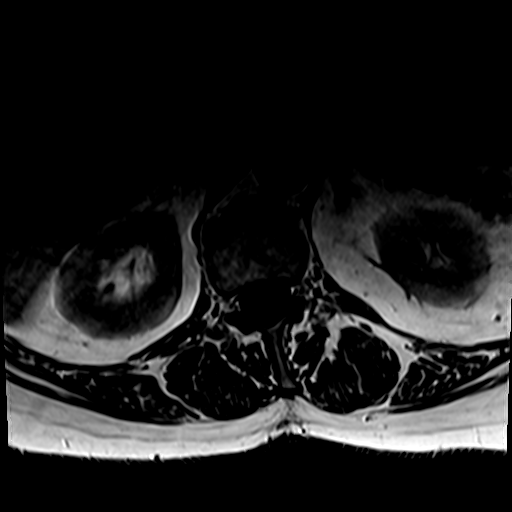
[im 35/35]
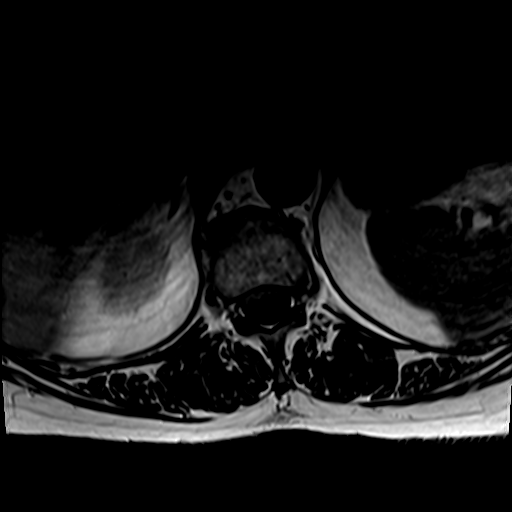

[31 of 48 positions shown; findings below may reference images not displayed]

FINDINGS: Segmentation: There are five lumbar type vertebral bodies. The last
full intervertebral disc space is labeled L5-S1.

Alignment:  Scoliosis but normal alignment in the sagittal plane.

Vertebrae: Normal marrow signal. No bone lesions or fractures. The
facets are normally aligned. No pars defects.

Conus medullaris and cauda equina: Conus extends to the L1 level.
Conus and cauda equina appear normal.

Paraspinal and other soft tissues: No significant findings. Small
right renal cyst is noted.

Disc levels:

L1-2: Mild facet disease but no disc protrusions, spinal or
foraminal stenosis.

L2-3: Mild to moderate facet disease but no disc protrusions, spinal
or foraminal stenosis. Mild annular bulge with mild lateral recess
encroachment on the left.

L3-4: Mild to moderate facet disease but no disc protrusions, spinal
or foraminal stenosis.

L4-5: Mild annular bulge, moderate facet disease and ligamentum
flavum thickening. There is flattening of the ventral thecal sac and
mild bilateral lateral recess stenosis. No foraminal stenosis.

L5-S1: Severe facet disease on the right and moderate facet disease
on the left. No disc protrusions, spinal or foraminal stenosis.
IMPRESSION: 1. Scoliosis.
2. Multilevel facet disease but no pars defects.
3. Mild bilateral lateral recess stenosis at L4-5.
4. No significant disc protrusions, spinal or foraminal stenosis.

## 2019-11-18 DIAGNOSIS — M47812 Spondylosis without myelopathy or radiculopathy, cervical region: Secondary | ICD-10-CM | POA: Diagnosis not present

## 2019-11-18 DIAGNOSIS — M47817 Spondylosis without myelopathy or radiculopathy, lumbosacral region: Secondary | ICD-10-CM | POA: Diagnosis not present

## 2019-11-18 DIAGNOSIS — M542 Cervicalgia: Secondary | ICD-10-CM | POA: Diagnosis not present

## 2019-11-28 DIAGNOSIS — M6281 Muscle weakness (generalized): Secondary | ICD-10-CM | POA: Diagnosis not present

## 2019-11-28 DIAGNOSIS — M47812 Spondylosis without myelopathy or radiculopathy, cervical region: Secondary | ICD-10-CM | POA: Diagnosis not present

## 2019-12-10 DIAGNOSIS — M47812 Spondylosis without myelopathy or radiculopathy, cervical region: Secondary | ICD-10-CM | POA: Diagnosis not present

## 2019-12-10 DIAGNOSIS — M6281 Muscle weakness (generalized): Secondary | ICD-10-CM | POA: Diagnosis not present

## 2019-12-25 DIAGNOSIS — M47812 Spondylosis without myelopathy or radiculopathy, cervical region: Secondary | ICD-10-CM | POA: Diagnosis not present

## 2019-12-30 DIAGNOSIS — M47812 Spondylosis without myelopathy or radiculopathy, cervical region: Secondary | ICD-10-CM | POA: Diagnosis not present

## 2019-12-30 DIAGNOSIS — M6281 Muscle weakness (generalized): Secondary | ICD-10-CM | POA: Diagnosis not present

## 2020-01-06 DIAGNOSIS — M6281 Muscle weakness (generalized): Secondary | ICD-10-CM | POA: Diagnosis not present

## 2020-01-06 DIAGNOSIS — M47812 Spondylosis without myelopathy or radiculopathy, cervical region: Secondary | ICD-10-CM | POA: Diagnosis not present

## 2020-01-07 DIAGNOSIS — M5136 Other intervertebral disc degeneration, lumbar region: Secondary | ICD-10-CM | POA: Diagnosis not present

## 2020-01-07 DIAGNOSIS — M5416 Radiculopathy, lumbar region: Secondary | ICD-10-CM | POA: Diagnosis not present

## 2020-01-07 DIAGNOSIS — M48062 Spinal stenosis, lumbar region with neurogenic claudication: Secondary | ICD-10-CM | POA: Diagnosis not present

## 2020-01-22 DIAGNOSIS — M6281 Muscle weakness (generalized): Secondary | ICD-10-CM | POA: Diagnosis not present

## 2020-01-22 DIAGNOSIS — M47812 Spondylosis without myelopathy or radiculopathy, cervical region: Secondary | ICD-10-CM | POA: Diagnosis not present

## 2020-01-24 ENCOUNTER — Encounter: Payer: Self-pay | Admitting: Internal Medicine

## 2020-01-29 DIAGNOSIS — M47812 Spondylosis without myelopathy or radiculopathy, cervical region: Secondary | ICD-10-CM | POA: Diagnosis not present

## 2020-01-29 DIAGNOSIS — M6281 Muscle weakness (generalized): Secondary | ICD-10-CM | POA: Diagnosis not present

## 2020-01-31 DIAGNOSIS — M47817 Spondylosis without myelopathy or radiculopathy, lumbosacral region: Secondary | ICD-10-CM | POA: Diagnosis not present

## 2020-01-31 DIAGNOSIS — M47812 Spondylosis without myelopathy or radiculopathy, cervical region: Secondary | ICD-10-CM | POA: Diagnosis not present

## 2020-01-31 DIAGNOSIS — E119 Type 2 diabetes mellitus without complications: Secondary | ICD-10-CM | POA: Diagnosis not present

## 2020-02-11 DIAGNOSIS — M48062 Spinal stenosis, lumbar region with neurogenic claudication: Secondary | ICD-10-CM | POA: Diagnosis not present

## 2020-02-11 DIAGNOSIS — M5136 Other intervertebral disc degeneration, lumbar region: Secondary | ICD-10-CM | POA: Diagnosis not present

## 2020-02-11 DIAGNOSIS — M5416 Radiculopathy, lumbar region: Secondary | ICD-10-CM | POA: Diagnosis not present

## 2020-02-12 DIAGNOSIS — Z1212 Encounter for screening for malignant neoplasm of rectum: Secondary | ICD-10-CM | POA: Diagnosis not present

## 2020-02-12 DIAGNOSIS — Z1211 Encounter for screening for malignant neoplasm of colon: Secondary | ICD-10-CM | POA: Diagnosis not present

## 2020-02-12 LAB — COLOGUARD: Cologuard: NEGATIVE

## 2020-02-20 ENCOUNTER — Ambulatory Visit (INDEPENDENT_AMBULATORY_CARE_PROVIDER_SITE_OTHER): Payer: Medicare PPO

## 2020-02-20 VITALS — Ht 64.0 in | Wt 184.0 lb

## 2020-02-20 DIAGNOSIS — M47812 Spondylosis without myelopathy or radiculopathy, cervical region: Secondary | ICD-10-CM | POA: Diagnosis not present

## 2020-02-20 DIAGNOSIS — Z0000 Encounter for general adult medical examination without abnormal findings: Secondary | ICD-10-CM | POA: Diagnosis not present

## 2020-02-20 DIAGNOSIS — M6281 Muscle weakness (generalized): Secondary | ICD-10-CM | POA: Diagnosis not present

## 2020-02-20 NOTE — Patient Instructions (Addendum)
Tabitha Lewis , Thank you for taking time to come for your Medicare Wellness Visit. I appreciate your ongoing commitment to your health goals. Please review the following plan we discussed and let me know if I can assist you in the future.   These are the goals we discussed: Goals      Patient Stated   .  Increase physical activity (pt-stated)      As tolerated.        This is a list of the screening recommended for you and due dates:  Health Maintenance  Topic Date Due  . Cologuard (Stool DNA test)  12/31/2019  . DEXA scan (bone density measurement)  02/19/2021*  . Mammogram  09/09/2021  . Tetanus Vaccine  07/21/2025  . Flu Shot  Completed  . COVID-19 Vaccine  Completed  .  Hepatitis C: One time screening is recommended by Center for Disease Control  (CDC) for  adults born from [birth year] through 1967.   Completed  . Pneumonia vaccines  Completed  *Topic was postponed. The date shown is not the original due date.    Advanced directives: End of life planning; Advance aging; Advanced directives discussed.  Copy of current HCPOA/Living Will requested.    Conditions/risks identified: UTI symptoms. Appointment scheduled.   Follow up in one year for your annual wellness visit.   Preventive Care 72 Years and Older, Female Preventive care refers to lifestyle choices and visits with your health care provider that can promote health and wellness. What does preventive care include?  A yearly physical exam. This is also called an annual well check.  Dental exams once or twice a year.  Routine eye exams. Ask your health care provider how often you should have your eyes checked.  Personal lifestyle choices, including:  Daily care of your teeth and gums.  Regular physical activity.  Eating a healthy diet.  Avoiding tobacco and drug use.  Limiting alcohol use.  Practicing safe sex.  Taking low-dose aspirin every day.  Taking vitamin and mineral supplements as recommended by  your health care provider. What happens during an annual well check? The services and screenings done by your health care provider during your annual well check will depend on your age, overall health, lifestyle risk factors, and family history of disease. Counseling  Your health care provider may ask you questions about your:  Alcohol use.  Tobacco use.  Drug use.  Emotional well-being.  Home and relationship well-being.  Sexual activity.  Eating habits.  History of falls.  Memory and ability to understand (cognition).  Work and work Astronomer.  Reproductive health. Screening  You may have the following tests or measurements:  Height, weight, and BMI.  Blood pressure.  Lipid and cholesterol levels. These may be checked every 5 years, or more frequently if you are over 61 years old.  Skin check.  Lung cancer screening. You may have this screening every year starting at age 43 if you have a 30-pack-year history of smoking and currently smoke or have quit within the past 15 years.  Fecal occult blood test (FOBT) of the stool. You may have this test every year starting at age 31.  Flexible sigmoidoscopy or colonoscopy. You may have a sigmoidoscopy every 5 years or a colonoscopy every 10 years starting at age 63.  Hepatitis C blood test.  Hepatitis B blood test.  Sexually transmitted disease (STD) testing.  Diabetes screening. This is done by checking your blood sugar (glucose) after you have not  eaten for a while (fasting). You may have this done every 1-3 years.  Bone density scan. This is done to screen for osteoporosis. You may have this done starting at age 4.  Mammogram. This may be done every 1-2 years. Talk to your health care provider about how often you should have regular mammograms. Talk with your health care provider about your test results, treatment options, and if necessary, the need for more tests. Vaccines  Your health care provider may  recommend certain vaccines, such as:  Influenza vaccine. This is recommended every year.  Tetanus, diphtheria, and acellular pertussis (Tdap, Td) vaccine. You may need a Td booster every 10 years.  Zoster vaccine. You may need this after age 51.  Pneumococcal 13-valent conjugate (PCV13) vaccine. One dose is recommended after age 18.  Pneumococcal polysaccharide (PPSV23) vaccine. One dose is recommended after age 45. Talk to your health care provider about which screenings and vaccines you need and how often you need them. This information is not intended to replace advice given to you by your health care provider. Make sure you discuss any questions you have with your health care provider. Document Released: 01/30/2015 Document Revised: 09/23/2015 Document Reviewed: 11/04/2014 Elsevier Interactive Patient Education  2017 [place] Prevention in the Home Falls can cause injuries. They can happen to people of all ages. There are many things you can do to make your home safe and to help prevent falls. What can I do on the outside of my home?  Regularly fix the edges of walkways and driveways and fix any cracks.  Remove anything that might make you trip as you walk through a door, such as a raised step or threshold.  Trim any bushes or trees on the path to your home.  Use bright outdoor lighting.  Clear any walking paths of anything that might make someone trip, such as rocks or tools.  Regularly check to see if handrails are loose or broken. Make sure that both sides of any steps have handrails.  Any raised decks and porches should have guardrails on the edges.  Have any leaves, snow, or ice cleared regularly.  Use sand or salt on walking paths during winter.  Clean up any spills in your garage right away. This includes oil or grease spills. What can I do in the bathroom?  Use night lights.  Install grab bars by the toilet and in the tub and shower. Do not use towel  bars as grab bars.  Use non-skid mats or decals in the tub or shower.  If you need to sit down in the shower, use a plastic, non-slip stool.  Keep the floor dry. Clean up any water that spills on the floor as soon as it happens.  Remove soap buildup in the tub or shower regularly.  Attach bath mats securely with double-sided non-slip rug tape.  Do not have throw rugs and other things on the floor that can make you trip. What can I do in the bedroom?  Use night lights.  Make sure that you have a light by your bed that is easy to reach.  Do not use any sheets or blankets that are too big for your bed. They should not hang down onto the floor.  Have a firm chair that has side arms. You can use this for support while you get dressed.  Do not have throw rugs and other things on the floor that can make you trip. What can I  do in the kitchen?  Clean up any spills right away.  Avoid walking on wet floors.  Keep items that you use a lot in easy-to-reach places.  If you need to reach something above you, use a strong step stool that has a grab bar.  Keep electrical cords out of the way.  Do not use floor polish or wax that makes floors slippery. If you must use wax, use non-skid floor wax.  Do not have throw rugs and other things on the floor that can make you trip. What can I do with my stairs?  Do not leave any items on the stairs.  Make sure that there are handrails on both sides of the stairs and use them. Fix handrails that are broken or loose. Make sure that handrails are as long as the stairways.  Check any carpeting to make sure that it is firmly attached to the stairs. Fix any carpet that is loose or worn.  Avoid having throw rugs at the top or bottom of the stairs. If you do have throw rugs, attach them to the floor with carpet tape.  Make sure that you have a light switch at the top of the stairs and the bottom of the stairs. If you do not have them, ask someone to  add them for you. What else can I do to help prevent falls?  Wear shoes that:  Do not have high heels.  Have rubber bottoms.  Are comfortable and fit you well.  Are closed at the toe. Do not wear sandals.  If you use a stepladder:  Make sure that it is fully opened. Do not climb a closed stepladder.  Make sure that both sides of the stepladder are locked into place.  Ask someone to hold it for you, if possible.  Clearly mark and make sure that you can see:  Any grab bars or handrails.  First and last steps.  Where the edge of each step is.  Use tools that help you move around (mobility aids) if they are needed. These include:  Canes.  Walkers.  Scooters.  Crutches.  Turn on the lights when you go into a dark area. Replace any light bulbs as soon as they burn out.  Set up your furniture so you have a clear path. Avoid moving your furniture around.  If any of your floors are uneven, fix them.  If there are any pets around you, be aware of where they are.  Review your medicines with your doctor. Some medicines can make you feel dizzy. This can increase your chance of falling. Ask your doctor what other things that you can do to help prevent falls. This information is not intended to replace advice given to you by your health care provider. Make sure you discuss any questions you have with your health care provider. Document Released: 10/30/2008 Document Revised: 06/11/2015 Document Reviewed: 02/07/2014 Elsevier Interactive Patient Education  2017 [employer].

## 2020-02-20 NOTE — Progress Notes (Signed)
Subjective:   Tabitha Lewis is a 70 y.o. female who presents for Medicare Annual (Subsequent) preventive examination.  Review of Systems    No ROS.  Medicare Wellness Virtual Visit.    Cardiac Risk Factors include: advanced age (>8men, >96 women);hypertension     Objective:    Today's Vitals   02/20/20 0909  Weight: 184 lb (83.5 kg)  Height: $Remove'5\' 4"'psIeTpt$  (1.626 m)   Body mass index is 31.58 kg/m.  Advanced Directives 02/20/2020 02/19/2019  Does Patient Have a Medical Advance Directive? Yes Yes  Type of Paramedic of [place];Living will [place];Living will  Does patient want to make changes to medical advance directive? No - Patient declined No - Patient declined  Copy of [place] in Chart? No - copy requested No - copy requested    Current Medications (verified) Outpatient Encounter Medications as of 02/20/2020  Medication Sig  . Azelastine-Fluticasone 137-50 MCG/ACT SUSP 1 spray bid  . cetirizine (ZYRTEC) 10 MG tablet Take by mouth.  . Cholecalciferol (VITAMIN D3 PO) Take 5,000 Units by mouth.  Marland Kitchen lisinopril-hydrochlorothiazide (ZESTORETIC) 20-12.5 MG tablet Take 1 tablet by mouth daily.  . Multiple Vitamin (MULTIVITAMIN) capsule Take 1 capsule by mouth daily.  . Olopatadine HCl 0.2 % SOLN One drop to each eye daily  . traZODone (DESYREL) 100 MG tablet Take 1 tablet (100 mg total) by mouth at bedtime as needed for sleep.   No facility-administered encounter medications on file as of 02/20/2020.    Allergies (verified) Benzalkonium chloride, Cetyl alcohol, Codeine, Lanolin, Neomycin, Nickel, and Propylene glycol   History: Past Medical History:  Diagnosis Date  . Allergy    i.e contact allergies  dermatology [facility]; also plants, trees   . Chicken pox   . Depression   . Family history of breast cancer    BRCA negative in the past  . Family history of breast cancer   . Family history of lung  cancer   . Family history of rectal cancer   . History of prediabetes   . Hypertension   . Interstitial cystitis   . Interstitial cystitis   . Prediabetes    A1C 6.3 06/14/16  . Spinal stenosis   . UTI (urinary tract infection)   . UTI (urinary tract infection)    Past Surgical History:  Procedure Laterality Date  . denies     denies psuH   Family History  Problem Relation Age of Onset  . Breast cancer Mother 71       s/p b/l masectomy   . Breast cancer Sister 38  . Breast cancer Maternal Aunt   . Breast cancer Maternal Grandmother   . Stroke Father        age 20   . Stroke Other        grandmother age 71   . Lupus Other    Social History   Socioeconomic History  . Marital status: Widowed    Spouse name: Not on file  . Number of children: Not on file  . Years of education: Not on file  . Highest education level: Not on file  Occupational History  . Not on file  Tobacco Use  . Smoking status: Former Research scientist (life sciences)  . Smokeless tobacco: Never Used  . Tobacco comment: smoked 4-5 years in college then quit  Substance and Sexual Activity  . Alcohol use: No  . Drug use: No  . Sexual activity: Not on file  Other Topics Concern  . Not on file  Social History Narrative   1 daughter and 1 son    3 grandsons    Married to high school sweet heart (husband) x 28 years dx'ed with prostate cancer in March 30, 2015 he died 05-29-17    Retired Engineer, manufacturing education [employer]   Social Determinants of Health   Financial Resource Strain: [place]   . Difficulty of Paying Living Expenses: Not hard at all  Food Insecurity: No Food Insecurity  . Worried About Charity fundraiser in the Last Year: Never true  . Ran Out of Food in the Last Year: Never true  Transportation Needs: No Transportation Needs  . Lack of Transportation (Medical): No  . Lack of Transportation (Non-Medical): No  Physical Activity: Insufficiently Active  . Days of Exercise per Week: 1 day  . Minutes of Exercise per  Session: 60 min  Stress: No Stress Concern Present  . Feeling of Stress : Not at all  Social Connections: Unknown  . Frequency of Communication with Friends and Family: More than three times a week  . Frequency of Social Gatherings with Friends and Family: More than three times a week  . Attends Religious Services: Not on file  . Active Member of Clubs or Organizations: Yes  . Attends Archivist Meetings: 1 to 4 times per year  . Marital Status: Married    Tobacco Counseling Counseling given: Not Answered Comment: smoked 4-5 years in college then quit   Clinical Intake:  Pre-visit preparation completed: Yes        Diabetes: No  How often do you need to have someone help you when you read instructions, pamphlets, or other written materials from your doctor or pharmacy?: 1 - Never    Interpreter Needed?: No      Activities of Daily Living In your present state of health, do you have any difficulty performing the following activities: 02/20/2020  Hearing? N  Vision? N  Difficulty concentrating or making decisions? N  Walking or climbing stairs? Y  Comment Followed by Ortho and PCP  Dressing or bathing? N  Doing errands, shopping? N  Preparing Food and eating ? N  Using the Toilet? N  In the past six months, have you accidently leaked urine? N  Do you have problems with loss of bowel control? N  Managing your Medications? N  Managing your Finances? N  Housekeeping or managing your Housekeeping? N  Some recent data might be hidden    Patient Care Team: McLean-Scocuzza, Nino Glow, MD as PCP - General (Internal Medicine)  Indicate any recent Medical Services you may have received from other than [facility] providers in the past year (date may be approximate).     Assessment:   This is a routine wellness examination for Tashira.  I connected with Kambrey today by telephone and verified that I am speaking with the correct person using two  identifiers. Location patient: home Location provider: work Persons participating in the virtual visit: patient, Marine scientist.    I discussed the limitations, risks, security and privacy concerns of performing an evaluation and management service by telephone and the availability of in person appointments. The patient expressed understanding and verbally consented to this telephonic visit.    Interactive audio and video telecommunications were attempted between this provider and patient, however failed, due to patient having technical difficulties OR patient did not have access to video capability.  We continued and completed visit with  audio only.  Some vital signs may be absent or patient reported.   Hearing/Vision screen  Hearing Screening   '125Hz'$  $Remo'250Hz'pfdoj$'500Hz'$'1000Hz'$'2000Hz'$'3000Hz'$'4000Hz'$'6000Hz'$'8000Hz'$   Right ear:           Left ear:           Comments: Patient is able to hear conversational tones without difficulty.  No issues reported.  Vision Screening Comments: Wears reader lenses Cataract extraction, bilateral Visual acuity not assessed, virtual visit.       Dietary issues and exercise activities discussed: Current Exercise Habits: Home exercise routine, Time (Minutes): 60, Frequency (Times/Week): 1, Weekly Exercise (Minutes/Week): 60, Intensity: Mild Healthy diet Good water intake  Goals      Patient Stated   .  Increase physical activity (pt-stated)      As tolerated.       Depression Screen PHQ 2/9 Scores 02/20/2020 09/25/2019 03/14/2019 02/19/2019 06/19/2017  PHQ - 2 Score 0 0 0 0 0    Fall Risk Fall Risk  02/20/2020 09/25/2019 03/14/2019 02/19/2019 06/19/2017  Falls in the past year? 0 0 0 0 No  Number falls in past yr: 0 0 0 - -  Injury with Fall? 0 0 0 - -  Follow up Falls evaluation completed Falls evaluation completed Falls evaluation completed Falls evaluation completed -    FALL RISK PREVENTION PERTAINING TO THE HOME: Handrails in use when climbing stairs? Yes Home free of  loose throw rugs in walkways, pet beds, electrical cords, etc? Yes  Adequate lighting in your home to reduce risk of falls? Yes   ASSISTIVE DEVICES UTILIZED TO PREVENT FALLS: Use of a cane, walker or w/c? No  Grab bars in the bathroom? Yes  Handheld shower? Yes  Elevated toilet seat or a handicapped toilet? No   TIMED UP AND GO: Was the test performed? No . Virtual visit.  Cognitive Function:  Patient is alert and oriented x3.  Denies difficulty focusing, making decisions, memory loss. Enjoys reading for brain health.  MMSE/6CIT deferred. Normal by direct communication/observation.    6CIT Screen 02/19/2019  What Year? 0 points  What month? 0 points  What time? 0 points  Count back from 20 0 points  Months in reverse 0 points  Repeat phrase 0 points  Total Score 0    Immunizations Immunization History  Administered Date(s) Administered  . Fluad Quad(high Dose 65+) 09/25/2019  . Hepatitis B, adult 02/02/2017, 03/07/2017, 08/03/2017  . Influenza Split 11/20/2014  . Influenza, High Dose Seasonal PF 09/27/2016, 09/26/2017, 09/27/2018  . Influenza-Unspecified 10/20/2015, 09/27/2016  . PFIZER(Purple Top)SARS-COV-2 Vaccination 02/05/2019, 02/26/2019, 10/15/2019  . Pneumococcal Conjugate-13 09/13/2017  . Pneumococcal Polysaccharide-23 11/20/2014  . Tdap 07/22/2015   Health Maintenance Health Maintenance  Topic Date Due  . Fecal DNA (Cologuard)  12/31/2019  . DEXA SCAN  02/19/2021 (Originally 07/09/2013)  . MAMMOGRAM  09/09/2021  . TETANUS/TDAP  07/21/2025  . INFLUENZA VACCINE  Completed  . COVID-19 Vaccine  Completed  . Hepatitis C Screening  Completed  . PNA vac Low Risk Adult  Completed   Cologuard received by patient and sent out last week to [employer] for result.   Mammogram status: Completed 08/21/19. Repeat every year.  Dexa Scan- postponed per patient.   Lung Cancer Screening: (Low Dose CT Chest recommended if Age 65-80 years, 30 pack-year currently smoking OR  have quit w/in 15years.) does not qualify.   Hepatitis C Screening: Completed 12/23/16.   Dental Screening: Recommended annual dental exams  for proper oral hygiene. Visits every 6 months.  Community Resource Referral / Chronic Care Management: CRR required this visit?  No   CCM required this visit?  No      Plan:   Keep all routine maintenance appointments.   Follow up 02/21/20 @ 1:00  Follow up 03/24/20 @ 11:30  I have personally reviewed and noted the following in the patient's chart:   . Medical and social history . Use of alcohol, tobacco or illicit drugs  . Current medications and supplements . Functional ability and status . Nutritional status . Physical activity . Advanced directives . List of other physicians . Hospitalizations, surgeries, and ER visits in previous 12 months . Vitals . Screenings to include cognitive, depression, and falls . Referrals and appointments  In addition, I have reviewed and discussed with patient certain preventive protocols, quality metrics, and best practice recommendations. A written personalized care plan for preventive services as well as general preventive health recommendations were provided to patient via mychart.     Varney Biles, LPN   02/25/3660

## 2020-02-21 ENCOUNTER — Encounter: Payer: Self-pay | Admitting: Internal Medicine

## 2020-02-21 ENCOUNTER — Ambulatory Visit: Payer: Medicare PPO | Admitting: Internal Medicine

## 2020-02-21 ENCOUNTER — Other Ambulatory Visit: Payer: Self-pay | Admitting: Internal Medicine

## 2020-02-21 ENCOUNTER — Other Ambulatory Visit: Payer: Self-pay

## 2020-02-21 VITALS — BP 140/84 | HR 99 | Temp 98.2°F | Ht 64.0 in | Wt 184.0 lb

## 2020-02-21 DIAGNOSIS — Z1231 Encounter for screening mammogram for malignant neoplasm of breast: Secondary | ICD-10-CM

## 2020-02-21 DIAGNOSIS — R1013 Epigastric pain: Secondary | ICD-10-CM

## 2020-02-21 DIAGNOSIS — Z0000 Encounter for general adult medical examination without abnormal findings: Secondary | ICD-10-CM | POA: Insufficient documentation

## 2020-02-21 DIAGNOSIS — E538 Deficiency of other specified B group vitamins: Secondary | ICD-10-CM

## 2020-02-21 DIAGNOSIS — I10 Essential (primary) hypertension: Secondary | ICD-10-CM | POA: Diagnosis not present

## 2020-02-21 DIAGNOSIS — Z0001 Encounter for general adult medical examination with abnormal findings: Secondary | ICD-10-CM

## 2020-02-21 DIAGNOSIS — E876 Hypokalemia: Secondary | ICD-10-CM | POA: Diagnosis not present

## 2020-02-21 DIAGNOSIS — M419 Scoliosis, unspecified: Secondary | ICD-10-CM | POA: Diagnosis not present

## 2020-02-21 DIAGNOSIS — Z0100 Encounter for examination of eyes and vision without abnormal findings: Secondary | ICD-10-CM

## 2020-02-21 DIAGNOSIS — N3000 Acute cystitis without hematuria: Secondary | ICD-10-CM | POA: Diagnosis not present

## 2020-02-21 DIAGNOSIS — E2839 Other primary ovarian failure: Secondary | ICD-10-CM

## 2020-02-21 DIAGNOSIS — R937 Abnormal findings on diagnostic imaging of other parts of musculoskeletal system: Secondary | ICD-10-CM

## 2020-02-21 DIAGNOSIS — E611 Iron deficiency: Secondary | ICD-10-CM | POA: Diagnosis not present

## 2020-02-21 DIAGNOSIS — Z9849 Cataract extraction status, unspecified eye: Secondary | ICD-10-CM

## 2020-02-21 DIAGNOSIS — M542 Cervicalgia: Secondary | ICD-10-CM | POA: Insufficient documentation

## 2020-02-21 DIAGNOSIS — M47816 Spondylosis without myelopathy or radiculopathy, lumbar region: Secondary | ICD-10-CM

## 2020-02-21 DIAGNOSIS — R21 Rash and other nonspecific skin eruption: Secondary | ICD-10-CM

## 2020-02-21 LAB — CBC WITH DIFFERENTIAL/PLATELET
Basophils Absolute: 0.1 10*3/uL (ref 0.0–0.1)
Basophils Relative: 1.3 % (ref 0.0–3.0)
Eosinophils Absolute: 0.3 10*3/uL (ref 0.0–0.7)
Eosinophils Relative: 3.6 % (ref 0.0–5.0)
HCT: 37.8 % (ref 36.0–46.0)
Hemoglobin: 12.6 g/dL (ref 12.0–15.0)
Lymphocytes Relative: 16.5 % (ref 12.0–46.0)
Lymphs Abs: 1.1 10*3/uL (ref 0.7–4.0)
MCHC: 33.4 g/dL (ref 30.0–36.0)
MCV: 84.1 fl (ref 78.0–100.0)
Monocytes Absolute: 0.9 10*3/uL (ref 0.1–1.0)
Monocytes Relative: 12.4 % — ABNORMAL HIGH (ref 3.0–12.0)
Neutro Abs: 4.6 10*3/uL (ref 1.4–7.7)
Neutrophils Relative %: 66.2 % (ref 43.0–77.0)
Platelets: 309 10*3/uL (ref 150.0–400.0)
RBC: 4.5 Mil/uL (ref 3.87–5.11)
RDW: 12.8 % (ref 11.5–15.5)
WBC: 7 10*3/uL (ref 4.0–10.5)

## 2020-02-21 LAB — COMPREHENSIVE METABOLIC PANEL
ALT: 25 U/L (ref 0–35)
AST: 22 U/L (ref 0–37)
Albumin: 4.3 g/dL (ref 3.5–5.2)
Alkaline Phosphatase: 62 U/L (ref 39–117)
BUN: 15 mg/dL (ref 6–23)
CO2: 31 mEq/L (ref 19–32)
Calcium: 9.6 mg/dL (ref 8.4–10.5)
Chloride: 99 mEq/L (ref 96–112)
Creatinine, Ser: 0.82 mg/dL (ref 0.40–1.20)
GFR: 71.87 mL/min (ref >60.00)
Glucose, Bld: 124 mg/dL — ABNORMAL HIGH (ref 70–99)
Potassium: 3 mEq/L — ABNORMAL LOW (ref 3.5–5.1)
Sodium: 136 mEq/L (ref 135–145)
Total Bilirubin: 0.5 mg/dL (ref 0.2–1.2)
Total Protein: 7.5 g/dL (ref 6.0–8.3)

## 2020-02-21 LAB — VITAMIN B12: Vitamin B-12: 291 pg/mL (ref 211–911)

## 2020-02-21 LAB — COLOGUARD: COLOGUARD: NEGATIVE

## 2020-02-21 LAB — EXTERNAL GENERIC LAB PROCEDURE: COLOGUARD: NEGATIVE

## 2020-02-21 MED ORDER — CIPROFLOXACIN HCL 500 MG PO TABS: 500.0000 mg | ORAL_TABLET | Freq: Two times a day (BID) | ORAL | 0 refills | Status: DC

## 2020-02-21 MED ORDER — POTASSIUM CHLORIDE ER 10 MEQ PO CPCR: 20.0000 meq | ORAL_CAPSULE | Freq: Two times a day (BID) | ORAL | 3 refills | Status: DC

## 2020-02-21 NOTE — Patient Instructions (Addendum)
Rosacea Rosacea is a long-term (chronic) condition that affects the skin of the face, including the cheeks, nose, forehead, and chin. This condition can also affect the eyes. Rosacea causes blood vessels near the surface of the skin to enlarge, which results in redness. What are the causes? The cause of this condition is not known. Certain triggers can make rosacea worse, including:  Hot baths.  Exercise.  Sunlight.  Very hot or cold temperatures.  Hot or spicy foods and drinks.  Drinking alcohol.  Stress.  Taking blood pressure medicine.  Long-term use of topical steroids on the face. What increases the risk? You are more likely to develop this condition if you:  Are older than 72 years of age.  Are a woman.  Have light-colored skin (light complexion).  Have a family history of rosacea. What are the signs or symptoms? Symptoms of this condition include:  Redness of the face.  Red bumps or pimples on the face.  A red, enlarged nose.  Blushing easily.  Red lines on the skin.  Irritated, burning, or itchy feeling in the eyes.  Swollen eyelids.  Drainage from the eyes.  Feeling like there is something in your eye.   How is this diagnosed? This condition is diagnosed with a medical history and physical exam. How is this treated? There is no cure for this condition, but treatment can help to control your symptoms. Your health care provider may recommend that you see a skin specialist (dermatologist). Treatment may include:  Medicines that are applied to the skin or taken by mouth (orally). This can include antibiotic medicines.  Laser treatment to improve the appearance of the skin.  Surgery. This is rare. Your health care provider will also recommend the best way to take care of your skin. Even after your skin improves, you will likely need to continue treatment to prevent your rosacea from coming back. Follow these instructions at home: Skin care Take  care of your skin as told by your health care provider. You may be told to do these things:  Wash your skin gently two or more times each day.  Use mild soap.  Use a sunscreen or sunblock with SPF 30 or greater.  Use gentle cosmetics that are meant for sensitive skin.  Shave with an electric shaver instead of a blade. Lifestyle  Try to keep track of what foods trigger this condition. Avoid any triggers. These may include: ? Spicy foods. ? Seafood. ? Cheese. ? Hot liquids. ? Nuts. ? Chocolate. ? Iodized salt.  Do not drink alcohol.  Avoid extremely cold or hot temperatures.  Try to reduce your stress. If you need help, talk with your health care provider.  When you exercise, do these things to stay cool: ? Limit sun exposure to your face. ? Use a fan. ? Do shorter and more frequent intervals of exercise. General instructions  Take and apply over-the-counter and prescription medicines only as told by your health care provider.  If you were prescribed an antibiotic medicine, apply it or take it as told by your health care provider. Do not stop using the antibiotic even if your condition improves.  If your eyelids are affected, apply warm compresses to them. Do this as told by your health care provider.  Keep all follow-up visits as told by your health care provider. This is important. Contact a health care provider if:  Your symptoms get worse.  Your symptoms do not improve after 2 months of treatment.  You   have new symptoms.  You have any changes in vision or you have problems with your eyes, such as redness or itching.  You feel depressed.  You lose your appetite.  You have trouble concentrating. Summary  Rosacea is a long-term (chronic) condition that affects the skin of the face, including the cheeks, nose, forehead, and chin.  Take care of your skin as told by your health care provider.  Take and apply over-the-counter and prescription medicines only as  told by your health care provider.  Contact a health care provider if your symptoms get worse or if you have any changes in vision or other problems with your eyes, such as redness or itching.  Keep all follow-up visits as told by your health care provider. This is important. This information is not intended to replace advice given to you by your health care provider. Make sure you discuss any questions you have with your health care provider. Document Revised: 06/07/2017 Document Reviewed: 06/07/2017 Elsevier Patient Education  2021 [employer]  DASH Eating Plan DASH stands for Dietary Approaches to Stop Hypertension. The DASH eating plan is a healthy eating plan that has been shown to:  Reduce high blood pressure (hypertension).  Reduce your risk for type 2 diabetes, heart disease, and stroke.  Help with weight loss. What are tips for following this plan? Reading food labels  Check food labels for the amount of salt (sodium) per serving. Choose foods with less than 5 percent of the Daily Value of sodium. Generally, foods with less than 300 milligrams (mg) of sodium per serving fit into this eating plan.  To find whole grains, look for the word "whole" as the first word in the ingredient list. Shopping  Buy products labeled as "low-sodium" or "no salt added."  Buy fresh foods. Avoid canned foods and pre-made or frozen meals. Cooking  Avoid adding salt when cooking. Use salt-free seasonings or herbs instead of table salt or sea salt. Check with your health care provider or pharmacist before using salt substitutes.  Do not fry foods. Cook foods using healthy methods such as baking, boiling, grilling, roasting, and broiling instead.  Cook with heart-healthy oils, such as olive, canola, avocado, soybean, or sunflower oil. Meal planning  Eat a balanced diet that includes: ? 4 or more servings of fruits and 4 or more servings of vegetables each day. Try to fill one-half of your  plate with fruits and vegetables. ? 6-8 servings of whole grains each day. ? Less than 6 oz (170 g) of lean meat, poultry, or fish each day. A 3-oz (85-g) serving of meat is about the same size as a deck of cards. One egg equals 1 oz (28 g). ? 2-3 servings of low-fat dairy each day. One serving is 1 cup (237 mL). ? 1 serving of nuts, seeds, or beans 5 times each week. ? 2-3 servings of heart-healthy fats. Healthy fats called omega-3 fatty acids are found in foods such as walnuts, flaxseeds, fortified milks, and eggs. These fats are also found in cold-water fish, such as sardines, salmon, and mackerel.  Limit how much you eat of: ? Canned or prepackaged foods. ? Food that is high in trans fat, such as some fried foods. ? Food that is high in saturated fat, such as fatty meat. ? Desserts and other sweets, sugary drinks, and other foods with added sugar. ? Full-fat dairy products.  Do not salt foods before eating.  Do not eat more than 4 egg yolks a  week.  Try to eat at least 2 vegetarian meals a week.  Eat more home-cooked food and less restaurant, buffet, and fast food.   Lifestyle  When eating at a restaurant, ask that your food be prepared with less salt or no salt, if possible.  If you drink alcohol: ? Limit how much you use to:  0-1 drink a day for women who are not pregnant.  0-2 drinks a day for men. ? Be aware of how much alcohol is in your drink. In the [place] one drink equals one 12 oz bottle of beer (355 mL), one 5 oz glass of wine (148 mL), or one 1 oz glass of hard liquor (44 mL). General information  Avoid eating more than 2,300 mg of salt a day. If you have hypertension, you may need to reduce your sodium intake to 1,500 mg a day.  Work with your health care provider to maintain a healthy body weight or to lose weight. Ask what an ideal weight is for you.  Get at least 30 minutes of exercise that causes your heart to beat faster (aerobic exercise) most days of the  week. Activities may include walking, swimming, or biking.  Work with your health care provider or dietitian to adjust your eating plan to your individual calorie needs. What foods should I eat? Fruits All fresh, dried, or frozen fruit. Canned fruit in natural juice (without added sugar). Vegetables Fresh or frozen vegetables (raw, steamed, roasted, or grilled). Low-sodium or reduced-sodium tomato and vegetable juice. Low-sodium or reduced-sodium tomato sauce and tomato paste. Low-sodium or reduced-sodium canned vegetables. Grains Whole-grain or whole-wheat bread. Whole-grain or whole-wheat pasta. Brown rice. Orpah Cobb. Bulgur. Whole-grain and low-sodium cereals. Pita bread. Low-fat, low-sodium crackers. Whole-wheat flour tortillas. Meats and other proteins Skinless chicken or [place]. Ground chicken or [place]. Pork with fat trimmed off. Fish and seafood. Egg whites. Dried beans, peas, or lentils. Unsalted nuts, nut butters, and seeds. Unsalted canned beans. Lean cuts of beef with fat trimmed off. Low-sodium, lean precooked or cured meat, such as sausages or meat loaves. Dairy Low-fat (1%) or fat-free (skim) milk. Reduced-fat, low-fat, or fat-free cheeses. Nonfat, low-sodium ricotta or cottage cheese. Low-fat or nonfat yogurt. Low-fat, low-sodium cheese. Fats and oils Soft margarine without trans fats. Vegetable oil. Reduced-fat, low-fat, or light mayonnaise and salad dressings (reduced-sodium). Canola, safflower, olive, avocado, soybean, and sunflower oils. Avocado. Seasonings and condiments Herbs. Spices. Seasoning mixes without salt. Other foods Unsalted popcorn and pretzels. Fat-free sweets. The items listed above may not be a complete list of foods and beverages you can eat. Contact a dietitian for more information. What foods should I avoid? Fruits Canned fruit in a light or heavy syrup. Fried fruit. Fruit in cream or butter sauce. Vegetables Creamed or fried vegetables.  Vegetables in a cheese sauce. Regular canned vegetables (not low-sodium or reduced-sodium). Regular canned tomato sauce and paste (not low-sodium or reduced-sodium). Regular tomato and vegetable juice (not low-sodium or reduced-sodium). Rosita Fire. Olives. Grains Baked goods made with fat, such as croissants, muffins, or some breads. Dry pasta or rice meal packs. Meats and other proteins Fatty cuts of meat. Ribs. Fried meat. Tomasa Blase. Bologna, salami, and other precooked or cured meats, such as sausages or meat loaves. Fat from the back of a pig (fatback). Bratwurst. Salted nuts and seeds. Canned beans with added salt. Canned or smoked fish. Whole eggs or egg yolks. Chicken or [place] with skin. Dairy Whole or 2% milk, cream, and half-and-half. Whole or full-fat cream cheese.  Whole-fat or sweetened yogurt. Full-fat cheese. Nondairy creamers. Whipped toppings. Processed cheese and cheese spreads. Fats and oils Butter. Stick margarine. Lard. Shortening. Ghee. Bacon fat. Tropical oils, such as coconut, palm kernel, or palm oil. Seasonings and condiments Onion salt, garlic salt, seasoned salt, table salt, and sea salt. Worcestershire sauce. Tartar sauce. Barbecue sauce. Teriyaki sauce. Soy sauce, including reduced-sodium. Steak sauce. Canned and packaged gravies. Fish sauce. Oyster sauce. Cocktail sauce. Store-bought horseradish. Ketchup. Mustard. Meat flavorings and tenderizers. Bouillon cubes. Hot sauces. Pre-made or packaged marinades. Pre-made or packaged taco seasonings. Relishes. Regular salad dressings. Other foods Salted popcorn and pretzels. The items listed above may not be a complete list of foods and beverages you should avoid. Contact a dietitian for more information. Where to find more information  National Heart, Lung, and Blood Institute: PopSteam.is  American Heart Association: www.heart.org  Academy of Nutrition and Dietetics: www.eatright.org  National Kidney Foundation:  www.kidney.org Summary  The DASH eating plan is a healthy eating plan that has been shown to reduce high blood pressure (hypertension). It may also reduce your risk for type 2 diabetes, heart disease, and stroke.  When on the DASH eating plan, aim to eat more fresh fruits and vegetables, whole grains, lean proteins, low-fat dairy, and heart-healthy fats.  With the DASH eating plan, you should limit salt (sodium) intake to 2,300 mg a day. If you have hypertension, you may need to reduce your sodium intake to 1,500 mg a day.  Work with your health care provider or dietitian to adjust your eating plan to your individual calorie needs. This information is not intended to replace advice given to you by your health care provider. Make sure you discuss any questions you have with your health care provider. Document Revised: 12/07/2018 Document Reviewed: 12/07/2018 Elsevier Patient Education  2021 Elsevier Inc.  Hypertension, Adult High blood pressure (hypertension) is when the force of blood pumping through the arteries is too strong. The arteries are the blood vessels that carry blood from the heart throughout the body. Hypertension forces the heart to work harder to pump blood and may cause arteries to become narrow or stiff. Untreated or uncontrolled hypertension can cause a heart attack, heart failure, a stroke, kidney disease, and other problems. A blood pressure reading consists of a higher number over a lower number. Ideally, your blood pressure should be below 120/80. The first ("top") number is called the systolic pressure. It is a measure of the pressure in your arteries as your heart beats. The second ("bottom") number is called the diastolic pressure. It is a measure of the pressure in your arteries as the heart relaxes. What are the causes? The exact cause of this condition is not known. There are some conditions that result in or are related to high blood pressure. What increases the  risk? Some risk factors for high blood pressure are under your control. The following factors may make you more likely to develop this condition:  Smoking.  Having type 2 diabetes mellitus, high cholesterol, or both.  Not getting enough exercise or physical activity.  Being overweight.  Having too much fat, sugar, calories, or salt (sodium) in your diet.  Drinking too much alcohol. Some risk factors for high blood pressure may be difficult or impossible to change. Some of these factors include:  Having chronic kidney disease.  Having a family history of high blood pressure.  Age. Risk increases with age.  Race. You may be at higher risk if you are African American.  Gender. Men are at higher risk than women before age 72. After age 72, women are at higher risk than men.  Having obstructive sleep apnea.  Stress. What are the signs or symptoms? High blood pressure may not cause symptoms. Very high blood pressure (hypertensive crisis) may cause:  Headache.  Anxiety.  Shortness of breath.  Nosebleed.  Nausea and vomiting.  Vision changes.  Severe chest pain.  Seizures. How is this diagnosed? This condition is diagnosed by measuring your blood pressure while you are seated, with your arm resting on a flat surface, your legs uncrossed, and your feet flat on the floor. The cuff of the blood pressure monitor will be placed directly against the skin of your upper arm at the level of your heart. It should be measured at least twice using the same arm. Certain conditions can cause a difference in blood pressure between your right and left arms. Certain factors can cause blood pressure readings to be lower or higher than normal for a short period of time:  When your blood pressure is higher when you are in a health care provider's office than when you are at home, this is called white coat hypertension. Most people with this condition do not need medicines.  When your blood  pressure is higher at home than when you are in a health care provider's office, this is called masked hypertension. Most people with this condition may need medicines to control blood pressure. If you have a high blood pressure reading during one visit or you have normal blood pressure with other risk factors, you may be asked to:  Return on a different day to have your blood pressure checked again.  Monitor your blood pressure at home for 1 week or longer. If you are diagnosed with hypertension, you may have other blood or imaging tests to help your health care provider understand your overall risk for other conditions. How is this treated? This condition is treated by making healthy lifestyle changes, such as eating healthy foods, exercising more, and reducing your alcohol intake. Your health care provider may prescribe medicine if lifestyle changes are not enough to get your blood pressure under control, and if:  Your systolic blood pressure is above 130.  Your diastolic blood pressure is above 80. Your personal target blood pressure may vary depending on your medical conditions, your age, and other factors. Follow these instructions at home: Eating and drinking  Eat a diet that is high in fiber and potassium, and low in sodium, added sugar, and fat. An example eating plan is called the DASH (Dietary Approaches to Stop Hypertension) diet. To eat this way: ? Eat plenty of fresh fruits and vegetables. Try to fill one half of your plate at each meal with fruits and vegetables. ? Eat whole grains, such as whole-wheat pasta, brown rice, or whole-grain bread. Fill about one fourth of your plate with whole grains. ? Eat or drink low-fat dairy products, such as skim milk or low-fat yogurt. ? Avoid fatty cuts of meat, processed or cured meats, and poultry with skin. Fill about one fourth of your plate with lean proteins, such as fish, chicken without skin, beans, eggs, or tofu. ? Avoid pre-made and  processed foods. These tend to be higher in sodium, added sugar, and fat.  Reduce your daily sodium intake. Most people with hypertension should eat less than 1,500 mg of sodium a day.  Do not drink alcohol if: ? Your health care provider tells you not  to drink. ? You are pregnant, may be pregnant, or are planning to become pregnant.  If you drink alcohol: ? Limit how much you use to:  0-1 drink a day for women.  0-2 drinks a day for men. ? Be aware of how much alcohol is in your drink. In the [place] one drink equals one 12 oz bottle of beer (355 mL), one 5 oz glass of wine (148 mL), or one 1 oz glass of hard liquor (44 mL).   Lifestyle  Work with your health care provider to maintain a healthy body weight or to lose weight. Ask what an ideal weight is for you.  Get at least 30 minutes of exercise most days of the week. Activities may include walking, swimming, or biking.  Include exercise to strengthen your muscles (resistance exercise), such as Pilates or lifting weights, as part of your weekly exercise routine. Try to do these types of exercises for 30 minutes at least 3 days a week.  Do not use any products that contain nicotine or tobacco, such as cigarettes, e-cigarettes, and chewing tobacco. If you need help quitting, ask your health care provider.  Monitor your blood pressure at home as told by your health care provider.  Keep all follow-up visits as told by your health care provider. This is important.   Medicines  Take over-the-counter and prescription medicines only as told by your health care provider. Follow directions carefully. Blood pressure medicines must be taken as prescribed.  Do not skip doses of blood pressure medicine. Doing this puts you at risk for problems and can make the medicine less effective.  Ask your health care provider about side effects or reactions to medicines that you should watch for. Contact a health care provider if you:  Think you are  having a reaction to a medicine you are taking.  Have headaches that keep coming back (recurring).  Feel dizzy.  Have swelling in your ankles.  Have trouble with your vision. Get help right away if you:  Develop a severe headache or confusion.  Have unusual weakness or numbness.  Feel faint.  Have severe pain in your chest or abdomen.  Vomit repeatedly.  Have trouble breathing. Summary  Hypertension is when the force of blood pumping through your arteries is too strong. If this condition is not controlled, it may put you at risk for serious complications.  Your personal target blood pressure may vary depending on your medical conditions, your age, and other factors. For most people, a normal blood pressure is less than 120/80.  Hypertension is treated with lifestyle changes, medicines, or a combination of both. Lifestyle changes include losing weight, eating a healthy, low-sodium diet, exercising more, and limiting alcohol. This information is not intended to replace advice given to you by your health care provider. Make sure you discuss any questions you have with your health care provider. Document Revised: 09/13/2017 Document Reviewed: 09/13/2017 Elsevier Patient Education  2021 [employer].

## 2020-02-21 NOTE — Progress Notes (Signed)
Chief Complaint  Patient presents with  . Urinary Tract Infection    Increased frequency and urgency over the last 2-3 weeks.    Annual  1. C/w uti sx's x 3 weeks increased freq at night and urgency with h/o UTI  2. S/p b/l cataract surgery Dr. Tommy Rainwater will refer AE routine eye exam 3. B/l cheek red pustules and red spot left lower leg will refer back to dermatology she reports put steroid cream on leg spot but this helped initially but spot is still there she has dogs at home  4. Chronic neck and low back pain f/u Dr. Sharlet Salina had 2 injections and did not help does not think will pursue further 10/2019 pain today is 5/10   IMPRESSION: 1. Scoliosis. 2. Multilevel facet disease but no pars defects. 3. Mild bilateral lateral recess stenosis at L4-5. 4. No significant disc protrusions, spinal or foraminal stenosis. 11/18/19 lumbar xray  X-rays/MRI/Lab data:   X-rays of the lumbar spine and pelvis are obtained. The lumbar films  demonstrate moderate degenerative changes throughout the lumbar spine,  most pronounced at L5-S1. The remaining disc spaces are reasonably  well-maintained. There is a mild concave left lumbar scoliosis. There is  no evidence for spondylolysis or spondylolisthesis. No lytic lesions or  fractures are identified. Exam End: 10/28/19 12:37 PM  10/28/19 hip Xray   The AP pelvis view demonstrates no evidence for fractures, lytic lesions,  or significant degenerative changes of either hip joint. Exam End: 10/28/19 12:37 PM   11/18/19 cervical X-rays/MRI/Lab data:  AP, lateral, and oblique views of the cervical spine are obtained. These  films demonstrate moderate multilevel cervical facet arthropathy as well  as mild degenerative disc changes as manifest by osteophytes anteriorly,  most pronounced at C4-5 and C6-7. Disc space height remains satisfactory  at all levels. There is no evidence of spondylolysis or  spondylolisthesis. No lytic lesions or  fractures are identified. Exam End: 11/18/19 2:48 PM  5. BP elevated today on lis 20-12.5 mg qd repeat BP improved today will monitor and let me know if BP not improved she drinks 3 cups of coffee daily and will try to do 1/2 caff 1/2 defcaff to lessen caffeine   Review of Systems  Constitutional: Negative for weight loss.  HENT: Negative for hearing loss.   Eyes: Negative for blurred vision.  Respiratory: Negative for shortness of breath.   Cardiovascular: Negative for chest pain.  Gastrointestinal: Negative for abdominal pain.  Genitourinary: Positive for frequency and urgency. Negative for dysuria.  Musculoskeletal: Positive for back pain and neck pain.  Skin: Negative for rash.  Neurological: Negative for headaches.  Psychiatric/Behavioral: Negative for depression.   Past Medical History:  Diagnosis Date  . Allergy    i.e contact allergies [facility]; also plants, trees   . Chicken pox   . Depression   . Family history of breast cancer    BRCA negative in the past  . Family history of breast cancer   . Family history of lung cancer   . Family history of rectal cancer   . History of prediabetes   . Hypertension   . Interstitial cystitis   . Interstitial cystitis   . Prediabetes    A1C 6.3 06/14/16  . Spinal stenosis   . UTI (urinary tract infection)   . UTI (urinary tract infection)    Past Surgical History:  Procedure Laterality Date  . CATARACT EXTRACTION     b/l Dr. Tommy Rainwater  Family History  Problem Relation Age of Onset  . Breast cancer Mother 95       s/p b/l masectomy   . Breast cancer Sister 18  . Breast cancer Maternal Aunt   . Breast cancer Maternal Grandmother   . Stroke Father        age 60   . Stroke Other        grandmother age 15   . Lupus Other   . Other Daughter        benign brain tumor   Social History   Socioeconomic History  . Marital status: Widowed    Spouse name: Not on file  . Number of children: Not on  file  . Years of education: Not on file  . Highest education level: Not on file  Occupational History  . Not on file  Tobacco Use  . Smoking status: Former Research scientist (life sciences)  . Smokeless tobacco: Never Used  . Tobacco comment: smoked 4-5 years in college then quit  Substance and Sexual Activity  . Alcohol use: No  . Drug use: No  . Sexual activity: Not on file  Other Topics Concern  . Not on file  Social History Narrative   1 daughter and 1 son    3 grandsons    Married to high school sweet heart (husband) x 87 years dx'ed with prostate cancer in 2015-04-19 he died 06/18/2017    Retired Engineer, manufacturing education [employer]   Social Determinants of Health   Financial Resource Strain: [place]   . Difficulty of Paying Living Expenses: Not hard at all  Food Insecurity: No Food Insecurity  . Worried About Charity fundraiser in the Last Year: Never true  . Ran Out of Food in the Last Year: Never true  Transportation Needs: No Transportation Needs  . Lack of Transportation (Medical): No  . Lack of Transportation (Non-Medical): No  Physical Activity: Insufficiently Active  . Days of Exercise per Week: 1 day  . Minutes of Exercise per Session: 60 min  Stress: No Stress Concern Present  . Feeling of Stress : Not at all  Social Connections: Unknown  . Frequency of Communication with Friends and Family: More than three times a week  . Frequency of Social Gatherings with Friends and Family: More than three times a week  . Attends Religious Services: Not on file  . Active Member of Clubs or Organizations: Yes  . Attends Archivist Meetings: 1 to 4 times per year  . Marital Status: Married  Human resources officer Violence: Not At Risk  . Fear of Current or Ex-Partner: No  . Emotionally Abused: No  . Physically Abused: No  . Sexually Abused: No   Current Meds  Medication Sig  . Azelastine-Fluticasone 137-50 MCG/ACT SUSP 1 spray bid  . cetirizine (ZYRTEC) 10 MG tablet Take by mouth.  .  Cholecalciferol (VITAMIN D3 PO) Take 5,000 Units by mouth.  Marland Kitchen lisinopril-hydrochlorothiazide (ZESTORETIC) 20-12.5 MG tablet Take 1 tablet by mouth daily.  . Multiple Vitamin (MULTIVITAMIN) capsule Take 1 capsule by mouth daily.  . Olopatadine HCl 0.2 % SOLN One drop to each eye daily  . traZODone (DESYREL) 100 MG tablet Take 1 tablet (100 mg total) by mouth at bedtime as needed for sleep.  . [DISCONTINUED] ciprofloxacin (CIPRO) 500 MG tablet Take 1 tablet (500 mg total) by mouth 2 (two) times daily. With food   Allergies  Allergen Reactions  . Benzalkonium Chloride Rash  . Cetyl  Alcohol Rash  . Codeine Rash  . Lanolin Rash  . Neomycin Rash  . Nickel Rash  . Propylene Glycol Rash   Recent Results (from the past 2160 hour(s))  Comprehensive metabolic panel     Status: Abnormal   Collection Time: 02/21/20  1:52 PM  Result Value Ref Range   Sodium 136 135 - 145 mEq/L   Potassium 3.0 (L) 3.5 - 5.1 mEq/L   Chloride 99 96 - 112 mEq/L   CO2 31 19 - 32 mEq/L   Glucose, Bld 124 (H) 70 - 99 mg/dL   BUN 15 6 - 23 mg/dL   Creatinine, Ser 0.82 0.40 - 1.20 mg/dL   Total Bilirubin 0.5 0.2 - 1.2 mg/dL   Alkaline Phosphatase 62 39 - 117 U/L   AST 22 0 - 37 U/L   ALT 25 0 - 35 U/L   Total Protein 7.5 6.0 - 8.3 g/dL   Albumin 4.3 3.5 - 5.2 g/dL   GFR 71.87 >60.00 mL/min    Comment: Calculated using the CKD-EPI Creatinine Equation (2021)   Calcium 9.6 8.4 - 10.5 mg/dL  CBC with Differential/Platelet     Status: Abnormal   Collection Time: 02/21/20  1:52 PM  Result Value Ref Range   WBC 7.0 4.0 - 10.5 K/uL   RBC 4.50 3.87 - 5.11 Mil/uL   Hemoglobin 12.6 12.0 - 15.0 g/dL   HCT 37.8 36.0 - 46.0 %   MCV 84.1 78.0 - 100.0 fl   MCHC 33.4 30.0 - 36.0 g/dL   RDW 12.8 11.5 - 15.5 %   Platelets 309.0 150.0 - 400.0 K/uL   Neutrophils Relative % 66.2 43.0 - 77.0 %   Lymphocytes Relative 16.5 12.0 - 46.0 %   Monocytes Relative 12.4 (H) 3.0 - 12.0 %   Eosinophils Relative 3.6 0.0 - 5.0 %   Basophils  Relative 1.3 0.0 - 3.0 %   Neutro Abs 4.6 1.4 - 7.7 K/uL   Lymphs Abs 1.1 0.7 - 4.0 K/uL   Monocytes Absolute 0.9 0.1 - 1.0 K/uL   Eosinophils Absolute 0.3 0.0 - 0.7 K/uL   Basophils Absolute 0.1 0.0 - 0.1 K/uL  B12     Status: None   Collection Time: 02/21/20  1:52 PM  Result Value Ref Range   Vitamin B-12 291 211 - 911 pg/mL   Objective  Body mass index is 31.58 kg/m. Wt Readings from Last 3 Encounters:  02/21/20 184 lb (83.5 kg)  02/20/20 184 lb (83.5 kg)  09/25/19 184 lb 3.2 oz (83.6 kg)   Temp Readings from Last 3 Encounters:  02/21/20 98.2 F (36.8 C) (Oral)  09/25/19 98.3 F (36.8 C) (Oral)  03/01/18 98.6 F (37 C) (Oral)   BP Readings from Last 3 Encounters:  02/21/20 140/84  09/25/19 126/76  03/01/18 132/60   Pulse Readings from Last 3 Encounters:  02/21/20 99  09/25/19 78  03/01/18 81    Physical Exam Vitals and nursing note reviewed.  Constitutional:      Appearance: Normal appearance. She is well-developed and well-groomed. She is obese.  HENT:     Head: Normocephalic and atraumatic.  Eyes:     Conjunctiva/sclera: Conjunctivae normal.     Pupils: Pupils are equal, round, and reactive to light.  Cardiovascular:     Rate and Rhythm: Normal rate and regular rhythm.     Heart sounds: Normal heart sounds. No murmur heard.   Pulmonary:     Effort: Pulmonary effort is normal.  Breath sounds: Normal breath sounds.  Abdominal:     General: Abdomen is flat. Bowel sounds are normal.     Tenderness: There is abdominal tenderness.    Skin:    General: Skin is warm and dry.  Neurological:     General: No focal deficit present.     Mental Status: She is alert and oriented to person, place, and time. Mental status is at baseline.     Gait: Gait normal.  Psychiatric:        Attention and Perception: Attention and perception normal.        Mood and Affect: Mood and affect normal.        Speech: Speech normal.        Behavior: Behavior normal.  Behavior is cooperative.        Thought Content: Thought content normal.        Cognition and Memory: Cognition and memory normal.        Judgment: Judgment normal.     Assessment  Plan  Annual physical exam Flu utdutd Tdap had 07/22/15 pna 23 11/20/14 prevnarutd Consider shingrix in futurewill Rx to pharmacy when pt ready disc today 02/21/20 and will let me know when ready 3/3 covid 19 vaccines Hep Bimmune, MMR immune D3 5000 IU daily  Last pap 07/22/15 negative HPV negout of age window Mammogram8/24/21neg 07/31/18 genetics test + sister and 1/2 tested +nieces  -ordered   Ordered dexa   cologaurdneg 12/30/16  -has kit as of 02/21/20 has returned   [facility] neg f/u in 1 yearsaw in 07/2018 and f/u in 1 year saw 07/2019 Hassan Rowan normal no bxs/lng - h/o eczema has steroid cream to use -will ask they see pt for above as of 02/21/20   rec healthy diet and exercise   Acute cystitis without hematuria - Plan: Urinalysis, Routine w reflex microscopic, Urine Culture, ciprofloxacin (CIPRO) 500 MG tablet bid 3-5 days  Epigastric abdominal pain new on exam not sxmatic before r/o GI issue I.e GB/pancreas etiology - Plan: US Abdomen Complete, Comprehensive metabolic panel, CBC with Differential/Platelet  Iron deficiency - Plan: Iron, TIBC and Ferritin Panel  B12 deficiency - Plan: B12  Routine eye exam - Plan: Ambulatory referral to Ophthalmology AE s/p b/l cataract surgery   Abnormal MRI, lumbar spine Scoliosis of lumbar spine, unspecified scoliosis type Lumbar facet arthropathy Cervicalgia -in PT and seeing PM&R  2 epidural injections did not help   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 Fungal vs dermatitis  rec f/u [facility] sent fax to call pt and sch she is established   Hypertension, unspecified type  On lis hctz 20/12.5 mg qd  hypoK will add K daily rx 20 meq bid Monitor BP  Provider: Dr. Olivia Mackie McLean-Scocuzza-Internal  Med[facility]

## 2020-02-24 LAB — IRON AND TIBC
Iron Saturation: 20 % (ref 15–55)
Iron: 57 ug/dL (ref 27–139)
Total Iron Binding Capacity: 282 ug/dL (ref 250–450)
UIBC: 225 ug/dL (ref 118–369)

## 2020-02-26 ENCOUNTER — Telehealth: Payer: Self-pay | Admitting: Internal Medicine

## 2020-02-26 ENCOUNTER — Encounter: Payer: Self-pay | Admitting: Internal Medicine

## 2020-02-26 ENCOUNTER — Telehealth: Payer: Self-pay

## 2020-02-26 LAB — URINE CULTURE

## 2020-02-26 NOTE — Telephone Encounter (Signed)
Faxed note from Dr [employer] about rash on face/rosacea-L leg pain circular rash. Requested appt ASAP. Faxed to [phone]

## 2020-02-26 NOTE — Telephone Encounter (Signed)
cologuard negative repeat in 3 years   

## 2020-02-27 NOTE — Telephone Encounter (Signed)
Patient informed and verbalized understanding

## 2020-02-28 DIAGNOSIS — M6281 Muscle weakness (generalized): Secondary | ICD-10-CM | POA: Diagnosis not present

## 2020-02-28 DIAGNOSIS — M47812 Spondylosis without myelopathy or radiculopathy, cervical region: Secondary | ICD-10-CM | POA: Diagnosis not present

## 2020-03-02 DIAGNOSIS — L821 Other seborrheic keratosis: Secondary | ICD-10-CM | POA: Diagnosis not present

## 2020-03-02 DIAGNOSIS — S80861A Insect bite (nonvenomous), right lower leg, initial encounter: Secondary | ICD-10-CM | POA: Diagnosis not present

## 2020-03-02 DIAGNOSIS — L718 Other rosacea: Secondary | ICD-10-CM | POA: Diagnosis not present

## 2020-03-03 ENCOUNTER — Other Ambulatory Visit: Payer: Self-pay

## 2020-03-03 ENCOUNTER — Ambulatory Visit
Admission: RE | Admit: 2020-03-03 | Discharge: 2020-03-03 | Disposition: A | Discharge status: Home / Self Care | Payer: Medicare PPO | Source: Ambulatory Visit | Attending: Internal Medicine | Admitting: Internal Medicine

## 2020-03-03 ENCOUNTER — Encounter: Payer: Self-pay | Admitting: Internal Medicine

## 2020-03-03 DIAGNOSIS — R1013 Epigastric pain: Secondary | ICD-10-CM | POA: Diagnosis not present

## 2020-03-03 IMAGING — US US ABDOMEN COMPLETE
1 series · 14 of 25 positions shown · non-contrast
Comparison: None.

CLINICAL DATA: Epigastric pain.

EXAM:
ABDOMEN ULTRASOUND COMPLETE

[Series 1: us abdomen complete · 0.22mm/px · 14 of 111 slices shown]
[im 1/111]
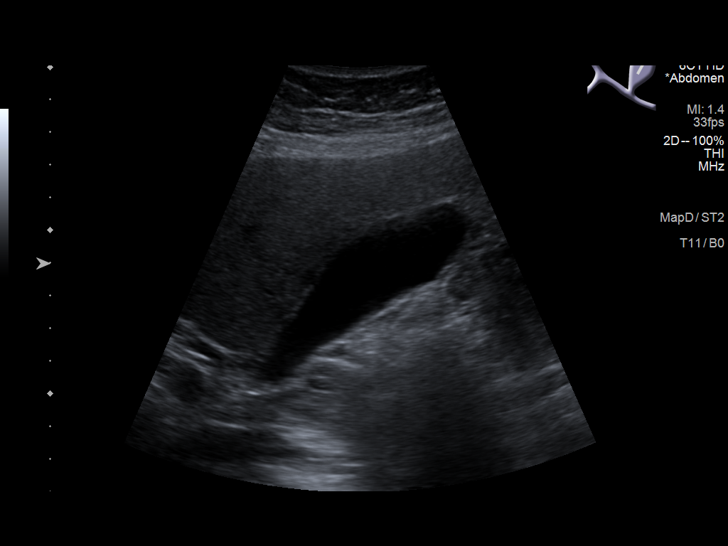
[im 10/111]
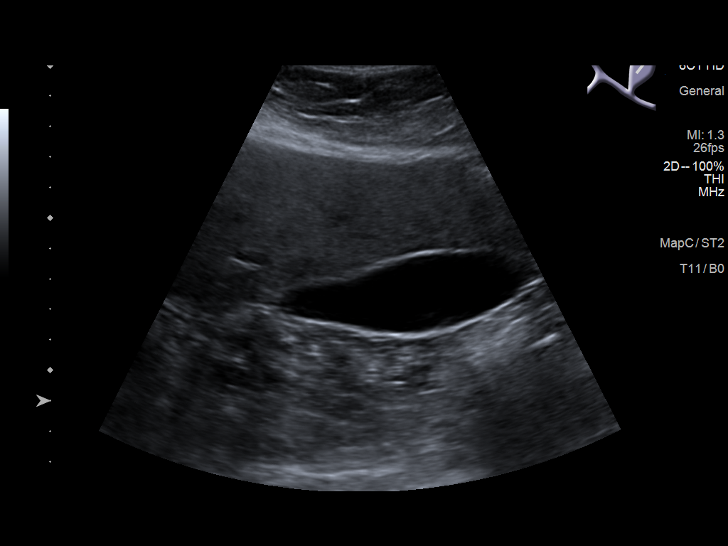
[im 19/111]
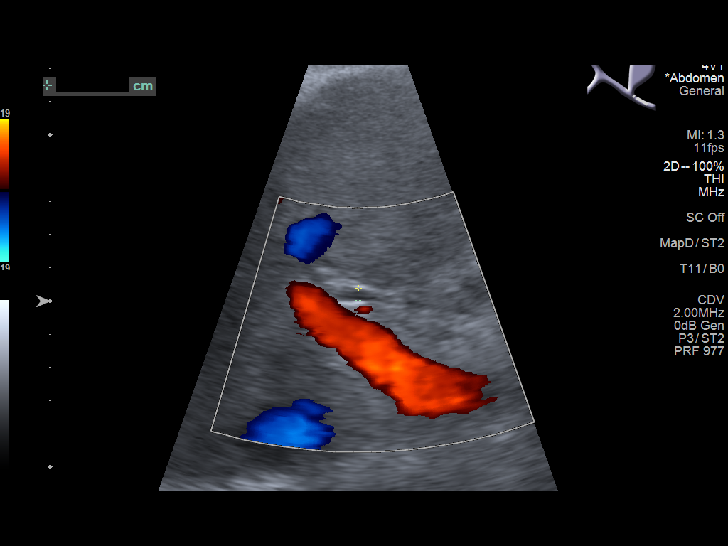
[im 28/111]
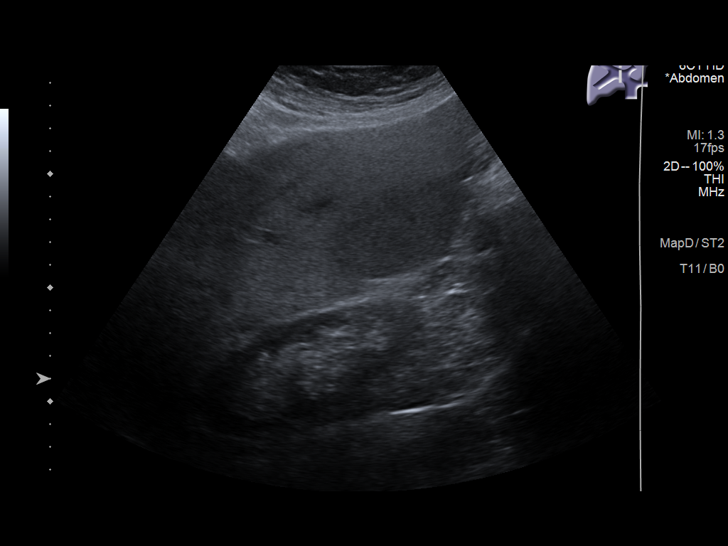
[im 37/111]
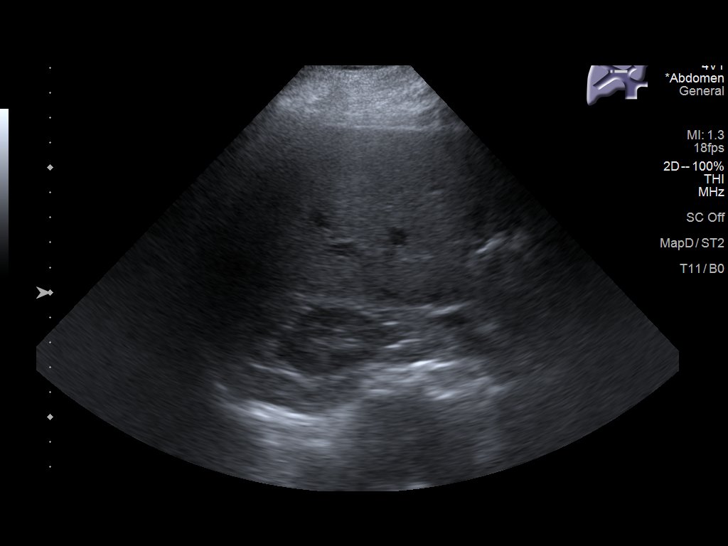
[im 42/111]
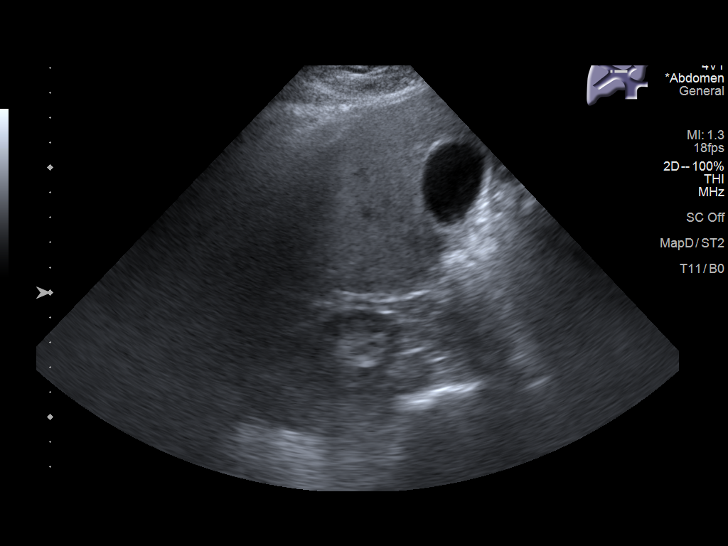
[im 51/111]
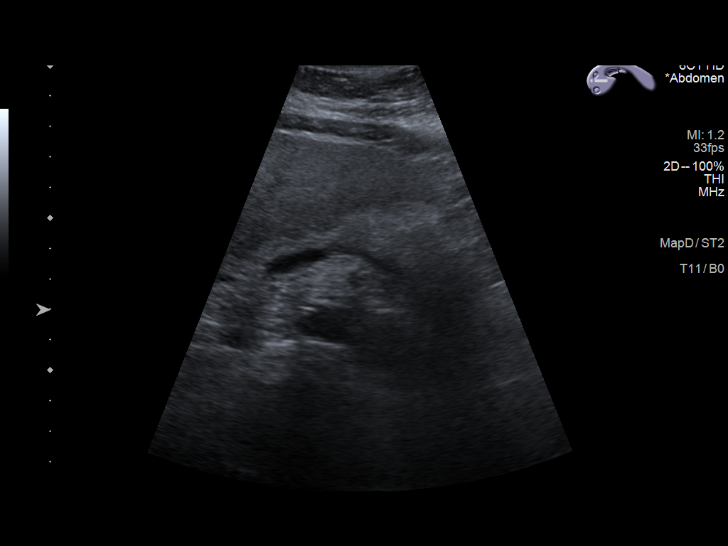
[im 60/111]
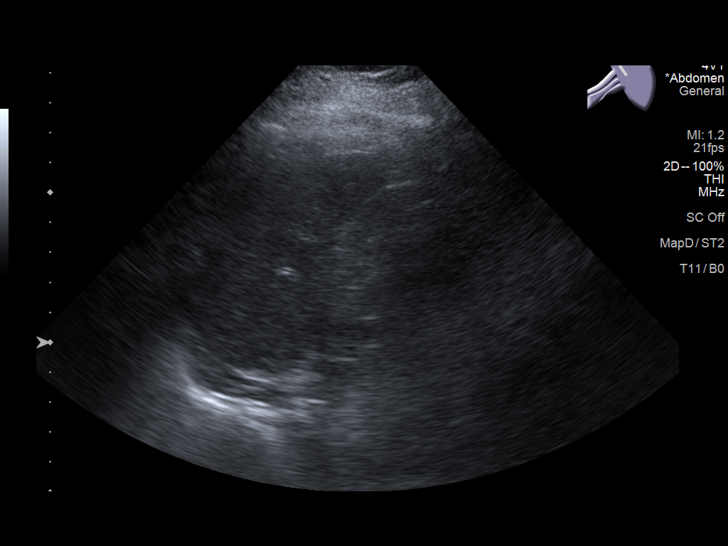
[im 69/111]
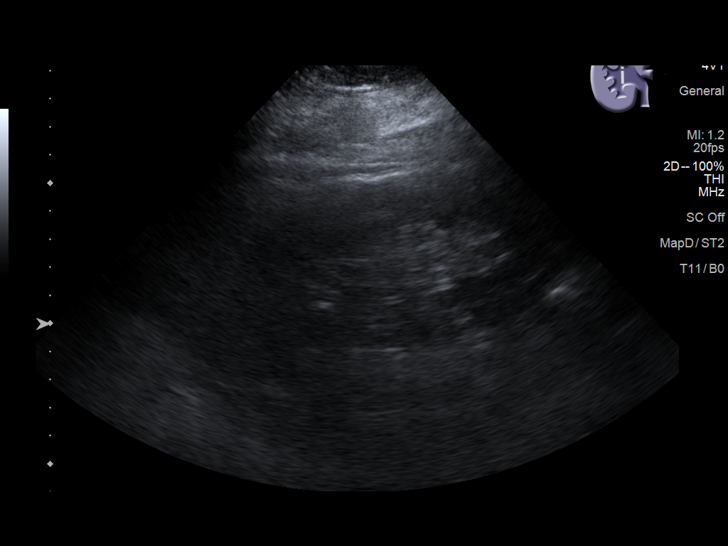
[im 74/111]
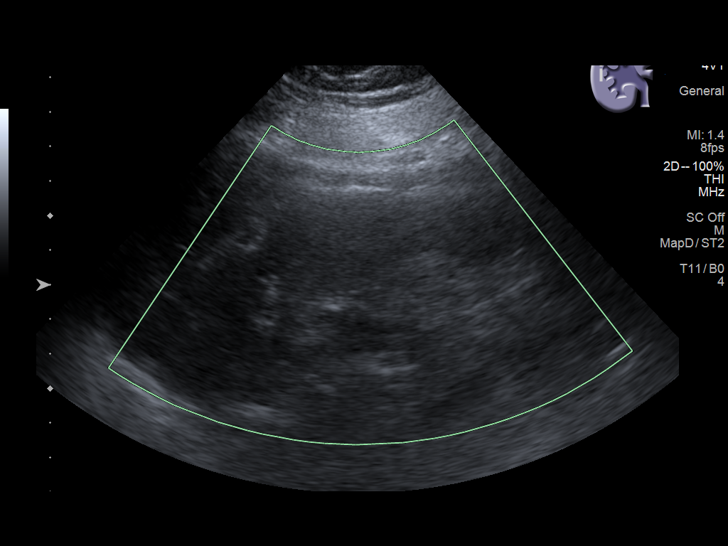
[im 83/111]
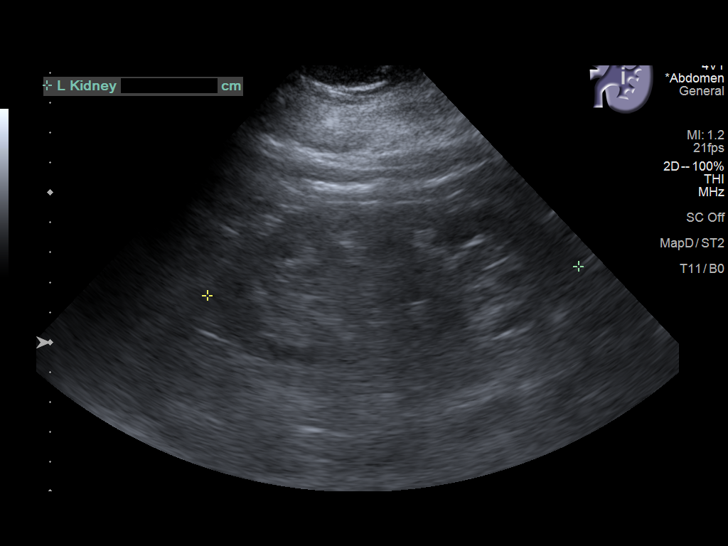
[im 92/111]
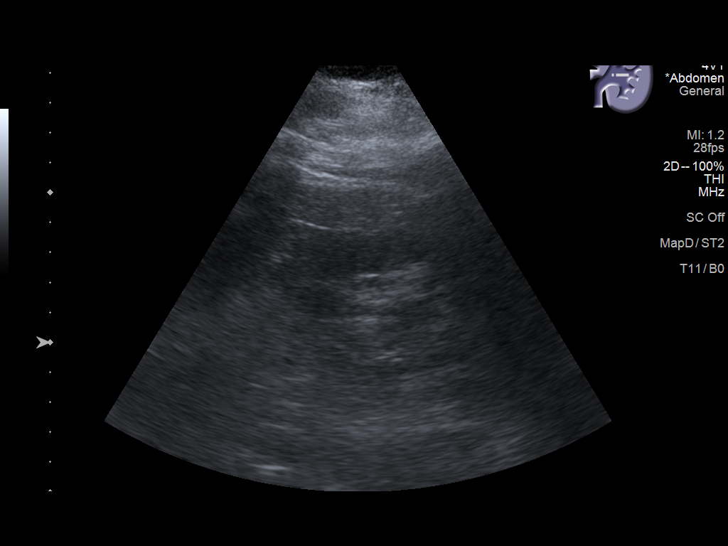
[im 101/111]
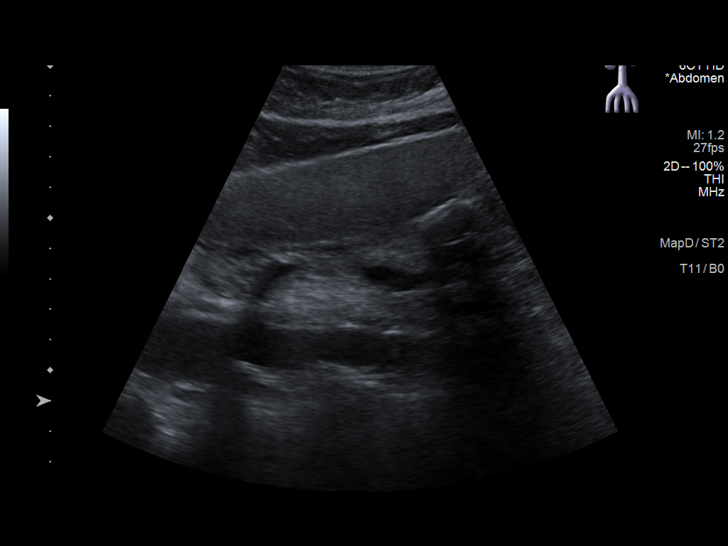
[im 111/111]
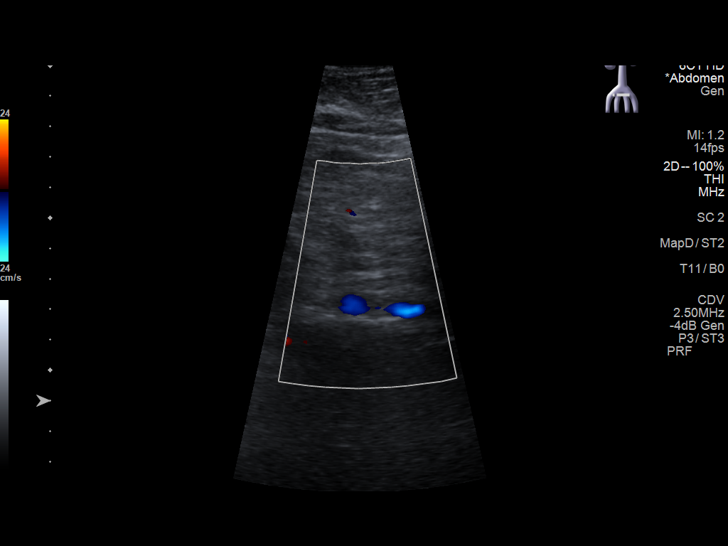

[14 of 25 positions shown; findings below may reference images not displayed]

FINDINGS: Gallbladder: No gallstones or wall thickening visualized. No
sonographic Murphy sign noted by sonographer.

Common bile duct: Diameter: 4 mm

Liver: No focal lesion identified. Diffusely increased hepatic
parenchymal echogenicity. Portal vein is patent on color Doppler
imaging with normal direction of blood flow towards the liver.

IVC: No abnormality visualized.

Pancreas: Visualized portion unremarkable.

Spleen: Size and appearance within normal limits.

Right Kidney: Length: 12 cm. Echogenicity within normal limits. No
mass or hydronephrosis visualized.

Left Kidney: Length: 12.4 cm. Echogenicity within normal limits. No
mass or hydronephrosis visualized.

Abdominal aorta: No aneurysm visualized.

Other findings: None.
IMPRESSION: 1. The echogenicity of the liver is increased. This is a nonspecific
finding but is most commonly seen with fatty infiltration of the
liver. There are no obvious focal liver lesions.
2. Otherwise unremarkable ultrasound of the abdomen.

## 2020-03-06 DIAGNOSIS — M47812 Spondylosis without myelopathy or radiculopathy, cervical region: Secondary | ICD-10-CM | POA: Diagnosis not present

## 2020-03-06 DIAGNOSIS — M6281 Muscle weakness (generalized): Secondary | ICD-10-CM | POA: Diagnosis not present

## 2020-03-17 DIAGNOSIS — M62830 Muscle spasm of back: Secondary | ICD-10-CM | POA: Diagnosis not present

## 2020-03-17 DIAGNOSIS — M47812 Spondylosis without myelopathy or radiculopathy, cervical region: Secondary | ICD-10-CM | POA: Diagnosis not present

## 2020-03-17 DIAGNOSIS — M5416 Radiculopathy, lumbar region: Secondary | ICD-10-CM | POA: Diagnosis not present

## 2020-03-17 DIAGNOSIS — M5030 Other cervical disc degeneration, unspecified cervical region: Secondary | ICD-10-CM | POA: Diagnosis not present

## 2020-03-17 DIAGNOSIS — M62838 Other muscle spasm: Secondary | ICD-10-CM | POA: Diagnosis not present

## 2020-03-17 DIAGNOSIS — M5136 Other intervertebral disc degeneration, lumbar region: Secondary | ICD-10-CM | POA: Diagnosis not present

## 2020-03-19 DIAGNOSIS — M6281 Muscle weakness (generalized): Secondary | ICD-10-CM | POA: Diagnosis not present

## 2020-03-19 DIAGNOSIS — M47812 Spondylosis without myelopathy or radiculopathy, cervical region: Secondary | ICD-10-CM | POA: Diagnosis not present

## 2020-03-24 ENCOUNTER — Ambulatory Visit: Payer: Medicare PPO | Admitting: Internal Medicine

## 2020-03-25 ENCOUNTER — Encounter: Payer: Self-pay | Admitting: Internal Medicine

## 2020-03-25 ENCOUNTER — Ambulatory Visit: Payer: Medicare PPO | Admitting: Internal Medicine

## 2020-03-25 ENCOUNTER — Other Ambulatory Visit: Payer: Self-pay

## 2020-03-25 VITALS — BP 130/72 | HR 92 | Temp 98.2°F | Ht 64.0 in | Wt 184.6 lb

## 2020-03-25 DIAGNOSIS — I152 Hypertension secondary to endocrine disorders: Secondary | ICD-10-CM

## 2020-03-25 DIAGNOSIS — E785 Hyperlipidemia, unspecified: Secondary | ICD-10-CM

## 2020-03-25 DIAGNOSIS — E876 Hypokalemia: Secondary | ICD-10-CM

## 2020-03-25 DIAGNOSIS — M542 Cervicalgia: Secondary | ICD-10-CM

## 2020-03-25 DIAGNOSIS — E669 Obesity, unspecified: Secondary | ICD-10-CM | POA: Diagnosis not present

## 2020-03-25 DIAGNOSIS — M47816 Spondylosis without myelopathy or radiculopathy, lumbar region: Secondary | ICD-10-CM

## 2020-03-25 DIAGNOSIS — R110 Nausea: Secondary | ICD-10-CM

## 2020-03-25 DIAGNOSIS — I10 Essential (primary) hypertension: Secondary | ICD-10-CM

## 2020-03-25 DIAGNOSIS — R252 Cramp and spasm: Secondary | ICD-10-CM

## 2020-03-25 DIAGNOSIS — R7303 Prediabetes: Secondary | ICD-10-CM

## 2020-03-25 DIAGNOSIS — J309 Allergic rhinitis, unspecified: Secondary | ICD-10-CM

## 2020-03-25 DIAGNOSIS — M25552 Pain in left hip: Secondary | ICD-10-CM

## 2020-03-25 DIAGNOSIS — E1159 Type 2 diabetes mellitus with other circulatory complications: Secondary | ICD-10-CM

## 2020-03-25 DIAGNOSIS — E559 Vitamin D deficiency, unspecified: Secondary | ICD-10-CM

## 2020-03-25 DIAGNOSIS — N179 Acute kidney failure, unspecified: Secondary | ICD-10-CM

## 2020-03-25 DIAGNOSIS — M419 Scoliosis, unspecified: Secondary | ICD-10-CM

## 2020-03-25 DIAGNOSIS — R937 Abnormal findings on diagnostic imaging of other parts of musculoskeletal system: Secondary | ICD-10-CM

## 2020-03-25 MED ORDER — WEGOVY 0.25 MG/0.5ML ~~LOC~~ SOAJ: 0.2500 mg | SUBCUTANEOUS | 0 refills | Status: DC

## 2020-03-25 MED ORDER — WEGOVY 2.4 MG/0.75ML ~~LOC~~ SOAJ: 2.4000 mg | SUBCUTANEOUS | 0 refills | Status: DC

## 2020-03-25 MED ORDER — PEN NEEDLES 30G X 8 MM MISC: 1.0000 | 2 refills | Status: DC

## 2020-03-25 MED ORDER — AZELASTINE-FLUTICASONE 137-50 MCG/ACT NA SUSP: NASAL | 11 refills | Status: DC

## 2020-03-25 MED ORDER — WEGOVY 1 MG/0.5ML ~~LOC~~ SOAJ: 1.0000 mg | SUBCUTANEOUS | 0 refills | Status: DC

## 2020-03-25 MED ORDER — WEGOVY 0.5 MG/0.5ML ~~LOC~~ SOAJ: 0.5000 mg | SUBCUTANEOUS | 0 refills | Status: DC

## 2020-03-25 MED ORDER — OLOPATADINE HCL 0.2 % OP SOLN: OPHTHALMIC | 11 refills | Status: DC

## 2020-03-25 MED ORDER — ONDANSETRON HCL 4 MG PO TABS: 4.0000 mg | ORAL_TABLET | Freq: Three times a day (TID) | ORAL | 5 refills | Status: DC | PRN

## 2020-03-25 MED ORDER — LISINOPRIL-HYDROCHLOROTHIAZIDE 20-12.5 MG PO TABS: 1.0000 | ORAL_TABLET | Freq: Every day | ORAL | 3 refills | Status: DC

## 2020-03-25 MED ORDER — WEGOVY 1.7 MG/0.75ML ~~LOC~~ SOAJ: 1.7000 mg | SUBCUTANEOUS | 0 refills | Status: DC

## 2020-03-25 NOTE — Patient Instructions (Addendum)
[place] blood pressure cuff upper arm BP cuff [employer] me in 2 weeks about your blood pressure   Leg Cramps Leg cramps occur when one or more muscles tighten and a person has no control over it (involuntary muscle contraction). Muscle cramps are most common in the calf muscles of the leg. They can occur during exercise or at rest. Leg cramps are painful, and they may last for a few seconds to a few minutes. Cramps may return several times before they finally stop. Usually, leg cramps are not caused by a serious medical problem. In many cases, the cause is not known. Some common causes include:  Excessive physical effort (overexertion), such as during intense exercise.  Doing the same motion over and over.  Staying in a certain position for a long period of time.  Improper preparation, form, or technique while doing a sport or an activity.  Dehydration.  Injury.  Side effects of certain medicines.  Abnormally low levels of minerals in your blood (electrolytes), especially potassium and calcium. This could result from: ? Pregnancy. ? Taking diuretic medicines. Follow these instructions at home: Eating and drinking  Drink enough fluid to keep your urine pale yellow. Staying hydrated may help prevent cramps.  Eat a healthy diet that includes plenty of nutrients to help your muscles function. A healthy diet includes fruits and vegetables, lean protein, whole grains, and low-fat or nonfat dairy products. Managing pain, stiffness, and swelling  Try massaging, stretching, and relaxing the affected muscle. Do this for several minutes at a time.  If directed, put ice on areas that are sore or painful after a cramp. To do this: ? Put ice in a plastic bag. ? Place a towel between your skin and the bag. ? Leave the ice on for 20 minutes, 2-3 times a day. ? Remove the ice if your skin turns bright red. This is very important. If you cannot feel pain, heat, or cold, you have a greater risk  of damage to the area.  If directed, apply heat to muscles that are tense or tight. Do this before you exercise, or as often as told by your health care provider. Use the heat source that your health care provider recommends, such as a moist heat pack or a heating pad. To do this: ? Place a towel between your skin and the heat source. ? Leave the heat on for 20-30 minutes. ? Remove the heat if your skin turns bright red. This is especially important if you are unable to feel pain, heat, or cold. You may have a greater risk of getting burned.  Try taking hot showers or baths to help relax tight muscles.      General instructions  If you are having frequent leg cramps, avoid intense exercise for several days.  Take over-the-counter and prescription medicines only as told by your health care provider.  Keep all follow-up visits. This is important. Contact a health care provider if:  Your leg cramps get more severe or more frequent, or they do not improve over time.  Your foot becomes cold, numb, or blue. Summary  Muscle cramps can develop in any muscle, but the most common place is in the calf muscles of the leg.  Leg cramps are painful, and they may last for a few seconds to a few minutes.  Usually, leg cramps are not caused by a serious medical problem. Often, the cause is not known.  Stay hydrated, and take over-the-counter and prescription medicines only  as told by your health care provider. This information is not intended to replace advice given to you by your health care provider. Make sure you discuss any questions you have with your health care provider. Document Revised: 05/22/2019 Document Reviewed: 05/22/2019 Elsevier Patient Education  2021 Elsevier Inc.   How to Take Your Blood Pressure Blood pressure is a measurement of how strongly your blood is pressing against the walls of your arteries. Arteries are blood vessels that carry blood from your heart throughout your  body. Your health care provider takes your blood pressure at each office visit. You can also take your own blood pressure at home with a blood pressure monitor. You may need to take your own blood pressure to:  Confirm a diagnosis of high blood pressure (hypertension).  Monitor your blood pressure over time.  Make sure your blood pressure medicine is working. Supplies needed:  Blood pressure monitor.  Dining room chair to sit in.  Table or desk.  Small notebook and pencil or pen. How to prepare To get the most accurate reading, avoid the following for 30 minutes before you check your blood pressure:  Drinking caffeine.  Drinking alcohol.  Eating.  Smoking.  Exercising. Five minutes before you check your blood pressure:  Use the bathroom and urinate so that you have an empty bladder.  Sit quietly in a dining room chair. Do not sit in a soft couch or an armchair. Do not talk. How to take your blood pressure To check your blood pressure, follow the instructions in the manual that came with your blood pressure monitor. If you have a digital blood pressure monitor, the instructions may be as follows: 1. Sit up straight in a chair. 2. Place your feet on the floor. Do not cross your ankles or legs. 3. Rest your left arm at the level of your heart on a table or desk or on the arm of a chair. 4. Pull up your shirt sleeve. 5. Wrap the blood pressure cuff around the upper part of your left arm, 1 inch (2.5 cm) above your elbow. It is best to wrap the cuff around bare skin. 6. Fit the cuff snugly around your arm. You should be able to place only one finger between the cuff and your arm. 7. Position the cord so that it rests in the bend of your elbow. 8. Press the power button. 9. Sit quietly while the cuff inflates and deflates. 10. Read the digital reading on the monitor screen and write the numbers down (record them) in a notebook. 11. Wait 2-3 minutes, then repeat the steps,  starting at step 1.   What does my blood pressure reading mean? A blood pressure reading consists of a higher number over a lower number. Ideally, your blood pressure should be below 120/80. The first ("top") number is called the systolic pressure. It is a measure of the pressure in your arteries as your heart beats. The second ("bottom") number is called the diastolic pressure. It is a measure of the pressure in your arteries as the heart relaxes. Blood pressure is classified into five stages. The following are the stages for adults who do not have a short-term serious illness or a chronic condition. Systolic pressure and diastolic pressure are measured in a unit called mm Hg (millimeters of mercury).  Normal  Systolic pressure: below 120.  Diastolic pressure: below 80. Elevated  Systolic pressure: 120-129.  Diastolic pressure: below 80. Hypertension stage 1  Systolic pressure: 130-139.  Diastolic pressure: 80-89. Hypertension stage 2  Systolic pressure: 140 or above.  Diastolic pressure: 90 or above. You can have elevated blood pressure or hypertension even if only the systolic or only the diastolic number in your reading is higher than normal. Follow these instructions at home:  Check your blood pressure as often as recommended by your health care provider.  Check your blood pressure at the same time every day.  Take your monitor to the next appointment with your health care provider to make sure that: ? You are using it correctly. ? It provides accurate readings.  Be sure you understand what your goal blood pressure numbers are.  Tell your health care provider if you are having any side effects from blood pressure medicine.  Keep all follow-up visits as told by your health care provider. This is important. General tips  Your health care provider can suggest a reliable monitor that will meet your needs. There are several types of home blood pressure monitors.  Choose a  monitor that has an arm cuff. Do not choose a monitor that measures your blood pressure from your wrist or finger.  Choose a cuff that wraps snugly around your upper arm. You should be able to fit only one finger between your arm and the cuff.  You can buy a blood pressure monitor at most drugstores or online. Where to find more information American Heart Association: www.heart.org Contact a health care provider if:  Your blood pressure is consistently high. Get help right away if:  Your systolic blood pressure is higher than 180.  Your diastolic blood pressure is higher than 120. Summary  Blood pressure is a measurement of how strongly your blood is pressing against the walls of your arteries.  A blood pressure reading consists of a higher number over a lower number. Ideally, your blood pressure should be below 120/80.  Check your blood pressure at the same time every day.  Avoid caffeine, alcohol, smoking, and exercise for 30 minutes prior to checking your blood pressure. These agents can affect the accuracy of the blood pressure reading. This information is not intended to replace advice given to you by your health care provider. Make sure you discuss any questions you have with your health care provider. Document Revised: 12/28/2018 Document Reviewed: 12/28/2018 Elsevier Patient Education  2021 Elsevier Inc.  Cooking With Less [employer] Cooking with less salt is one way to reduce the amount of sodium you get from food. Sodium is one of the elements that make up salt. It is found naturally in foods and is also added to certain foods. Depending on your condition and overall health, your health care provider or dietitian may recommend that you reduce your sodium intake. Most people should have less than 2,300 milligrams (mg) of sodium each day. If you have high blood pressure (hypertension), you may need to limit your sodium to 1,500 mg each day. Follow the tips below to help reduce your  sodium intake. What are tips for eating less sodium? Reading food labels  Check the food label before buying or using packaged ingredients. Always check the label for the serving size and sodium content.  Look for products with no more than 140 mg of sodium in one serving.  Check the % Daily Value column to see what percent of the daily recommended amount of sodium is provided in one serving of the product. Foods with 5% or less in this column are considered low in sodium. Foods with 20%  or higher are considered high in sodium.  Do not choose foods with salt as one of the first three ingredients on the ingredients list. If salt is one of the first three ingredients, it usually means the item is high in sodium.   Shopping  Buy sodium-free or low-sodium products. Look for the following words on food labels: ? Low-sodium. ? Sodium-free. ? Reduced-sodium. ? No salt added. ? Unsalted.  Always check the sodium content even if foods are labeled as low-sodium or no salt added.  Buy fresh foods. Cooking  Use herbs, seasonings without salt, and spices as substitutes for salt.  Use sodium-free baking soda when baking.  Grill, braise, or roast foods to add flavor with less salt.  Avoid adding salt to pasta, rice, or hot cereals.  Drain and rinse canned vegetables, beans, and meat before use.  Avoid adding salt when cooking sweets and desserts.  Cook with low-sodium ingredients. What foods are high in sodium? Vegetables Regular canned vegetables (not low-sodium or reduced-sodium). Sauerkraut, pickled vegetables, and relishes. Olives. [place] fries. Onion rings. Regular canned tomato sauce and paste. Regular tomato and vegetable juice. Frozen vegetables in sauces. Grains Instant hot cereals. Bread stuffing, pancake, and biscuit mixes. Croutons. Seasoned rice or pasta mixes. Noodle soup cups. Boxed or frozen macaroni and cheese. Regular salted crackers. Self-rising flour. Rolls. Bagels.  Flour tortillas and wraps. Meats and other proteins Meat or fish that is salted, canned, smoked, cured, spiced, or pickled. This includes bacon, ham, sausages, hot dogs, corned beef, chipped beef, meat loaves, salt pork, jerky, pickled herring, anchovies, regular canned tuna, and sardines. Salted nuts. Dairy Processed cheese and cheese spreads. Cheese curds. Blue cheese. Feta cheese. String cheese. Regular cottage cheese. Buttermilk. Canned milk. The items listed above may not be a complete list of foods high in sodium. Actual amounts of sodium may be different depending on processing. Contact a dietitian for more information. What foods are low in sodium? Fruits Fresh, frozen, or canned fruit with no sauce added. Fruit juice. Vegetables Fresh or frozen vegetables with no sauce added. "No salt added" canned vegetables. "No salt added" tomato sauce and paste. Low-sodium or reduced-sodium tomato and vegetable juice. Grains Noodles, pasta, quinoa, rice. Shredded or puffed wheat or puffed rice. Regular or quick oats (not instant). Low-sodium crackers. Low-sodium bread. Whole-grain bread and whole-grain pasta. Unsalted popcorn. Meats and other proteins Fresh or frozen whole meats, poultry (not injected with sodium), and fish with no sauce added. Unsalted nuts. Dried peas, beans, and lentils without added salt. Unsalted canned beans. Eggs. Unsalted nut butters. Low-sodium canned tuna or chicken. Dairy Milk. Soy milk. Yogurt. Low-sodium cheeses, such as Swiss, [address], [place], and [employer]. Sherbet or ice cream (keep to  cup per serving). Cream cheese. Fats and oils Unsalted butter or margarine. Other foods Homemade pudding. Sodium-free baking soda and baking powder. Herbs and spices. Low-sodium seasoning mixes. Beverages Coffee and tea. Carbonated beverages. The items listed above may not be a complete list of foods low in sodium. Actual amounts of sodium may be different depending on  processing. Contact a dietitian for more information. What are some salt alternatives when cooking? The following are herbs, seasonings, and spices that can be used instead of salt to flavor your food. Herbs should be fresh or dried. Do not choose packaged mixes. Next to the name of the herb, spice, or seasoning are some examples of foods you can pair it with. Herbs  Bay leaves - Soups, meat and vegetable dishes,  and spaghetti sauce.  Basil - [employer], soups, pasta, and fish dishes.  Cilantro - Meat, poultry, and vegetable dishes.  Chili powder - Marinades and Mexican dishes.  Chives - Salad dressings and potato dishes.  Cumin - Mexican dishes, couscous, and meat dishes.  Dill - Fish dishes, sauces, and salads.  Fennel - Meat and vegetable dishes, breads, and cookies.  Garlic (do not use garlic salt) - [place] dishes, meat dishes, salad dressings, and sauces.  Marjoram - Soups, potato dishes, and meat dishes.  Oregano - Pizza and spaghetti sauce.  Parsley - Salads, soups, pasta, and meat dishes.  Rosemary - [place] dishes, salad dressings, soups, and red meats.  Saffron - Fish dishes, pasta, and some poultry dishes.  Sage - Stuffings and sauces.  Tarragon - Fish and Whole Foods.  Thyme - Stuffing, meat, and fish dishes. Seasonings  Lemon juice - Fish dishes, poultry dishes, vegetables, and salads.  Vinegar - Salad dressings, vegetables, and fish dishes. Spices  Cinnamon - Sweet dishes, such as cakes, cookies, and puddings.  Cloves - Gingerbread, puddings, and marinades for meats.  Curry - Vegetable dishes, fish and poultry dishes, and stir-fry dishes.  Ginger - Vegetable dishes, fish dishes, and stir-fry dishes.  Nutmeg - Pasta, vegetables, poultry, fish dishes, and custard. Summary  Cooking with less salt is one way to reduce the amount of sodium that you get from food.  Buy sodium-free or low-sodium products.  Check the food label before using or  buying packaged ingredients.  Use herbs, seasonings without salt, and spices as substitutes for salt in foods. This information is not intended to replace advice given to you by your health care provider. Make sure you discuss any questions you have with your health care provider. Document Revised: 12/26/2018 Document Reviewed: 12/26/2018 Elsevier Patient Education  2021 Elsevier Inc.  DASH Eating Plan DASH stands for Dietary Approaches to Stop Hypertension. The DASH eating plan is a healthy eating plan that has been shown to:  Reduce high blood pressure (hypertension).  Reduce your risk for type 2 diabetes, heart disease, and stroke.  Help with weight loss. What are tips for following this plan? Reading food labels  Check food labels for the amount of salt (sodium) per serving. Choose foods with less than 5 percent of the Daily Value of sodium. Generally, foods with less than 300 milligrams (mg) of sodium per serving fit into this eating plan.  To find whole grains, look for the word "whole" as the first word in the ingredient list. Shopping  Buy products labeled as "low-sodium" or "no salt added."  Buy fresh foods. Avoid canned foods and pre-made or frozen meals. Cooking  Avoid adding salt when cooking. Use salt-free seasonings or herbs instead of table salt or sea salt. Check with your health care provider or pharmacist before using salt substitutes.  Do not fry foods. Cook foods using healthy methods such as baking, boiling, grilling, roasting, and broiling instead.  Cook with heart-healthy oils, such as olive, canola, avocado, soybean, or sunflower oil. Meal planning  Eat a balanced diet that includes: ? 4 or more servings of fruits and 4 or more servings of vegetables each day. Try to fill one-half of your plate with fruits and vegetables. ? 6-8 servings of whole grains each day. ? Less than 6 oz (170 g) of lean meat, poultry, or fish each day. A 3-oz (85-g) serving of  meat is about the same size as a deck of cards. One egg equals  1 oz (28 g). ? 2-3 servings of low-fat dairy each day. One serving is 1 cup (237 mL). ? 1 serving of nuts, seeds, or beans 5 times each week. ? 2-3 servings of heart-healthy fats. Healthy fats called omega-3 fatty acids are found in foods such as walnuts, flaxseeds, fortified milks, and eggs. These fats are also found in cold-water fish, such as sardines, salmon, and mackerel.  Limit how much you eat of: ? Canned or prepackaged foods. ? Food that is high in trans fat, such as some fried foods. ? Food that is high in saturated fat, such as fatty meat. ? Desserts and other sweets, sugary drinks, and other foods with added sugar. ? Full-fat dairy products.  Do not salt foods before eating.  Do not eat more than 4 egg yolks a week.  Try to eat at least 2 vegetarian meals a week.  Eat more home-cooked food and less restaurant, buffet, and fast food.   Lifestyle  When eating at a restaurant, ask that your food be prepared with less salt or no salt, if possible.  If you drink alcohol: ? Limit how much you use to:  0-1 drink a day for women who are not pregnant.  0-2 drinks a day for men. ? Be aware of how much alcohol is in your drink. In the [place] one drink equals one 12 oz bottle of beer (355 mL), one 5 oz glass of wine (148 mL), or one 1 oz glass of hard liquor (44 mL). General information  Avoid eating more than 2,300 mg of salt a day. If you have hypertension, you may need to reduce your sodium intake to 1,500 mg a day.  Work with your health care provider to maintain a healthy body weight or to lose weight. Ask what an ideal weight is for you.  Get at least 30 minutes of exercise that causes your heart to beat faster (aerobic exercise) most days of the week. Activities may include walking, swimming, or biking.  Work with your health care provider or dietitian to adjust your eating plan to your individual calorie  needs. What foods should I eat? Fruits All fresh, dried, or frozen fruit. Canned fruit in natural juice (without added sugar). Vegetables Fresh or frozen vegetables (raw, steamed, roasted, or grilled). Low-sodium or reduced-sodium tomato and vegetable juice. Low-sodium or reduced-sodium tomato sauce and tomato paste. Low-sodium or reduced-sodium canned vegetables. Grains Whole-grain or whole-wheat bread. Whole-grain or whole-wheat pasta. Brown rice. Orpah Cobb. Bulgur. Whole-grain and low-sodium cereals. Pita bread. Low-fat, low-sodium crackers. Whole-wheat flour tortillas. Meats and other proteins Skinless chicken or [place]. Ground chicken or [place]. Pork with fat trimmed off. Fish and seafood. Egg whites. Dried beans, peas, or lentils. Unsalted nuts, nut butters, and seeds. Unsalted canned beans. Lean cuts of beef with fat trimmed off. Low-sodium, lean precooked or cured meat, such as sausages or meat loaves. Dairy Low-fat (1%) or fat-free (skim) milk. Reduced-fat, low-fat, or fat-free cheeses. Nonfat, low-sodium ricotta or cottage cheese. Low-fat or nonfat yogurt. Low-fat, low-sodium cheese. Fats and oils Soft margarine without trans fats. Vegetable oil. Reduced-fat, low-fat, or light mayonnaise and salad dressings (reduced-sodium). Canola, safflower, olive, avocado, soybean, and sunflower oils. Avocado. Seasonings and condiments Herbs. Spices. Seasoning mixes without salt. Other foods Unsalted popcorn and pretzels. Fat-free sweets. The items listed above may not be a complete list of foods and beverages you can eat. Contact a dietitian for more information. What foods should I avoid? Fruits Canned fruit in a light  or heavy syrup. Fried fruit. Fruit in cream or butter sauce. Vegetables Creamed or fried vegetables. Vegetables in a cheese sauce. Regular canned vegetables (not low-sodium or reduced-sodium). Regular canned tomato sauce and paste (not low-sodium or reduced-sodium). Regular  tomato and vegetable juice (not low-sodium or reduced-sodium). Rosita Fire. Olives. Grains Baked goods made with fat, such as croissants, muffins, or some breads. Dry pasta or rice meal packs. Meats and other proteins Fatty cuts of meat. Ribs. Fried meat. Tomasa Blase. Bologna, salami, and other precooked or cured meats, such as sausages or meat loaves. Fat from the back of a pig (fatback). Bratwurst. Salted nuts and seeds. Canned beans with added salt. Canned or smoked fish. Whole eggs or egg yolks. Chicken or [place] with skin. Dairy Whole or 2% milk, cream, and half-and-half. Whole or full-fat cream cheese. Whole-fat or sweetened yogurt. Full-fat cheese. Nondairy creamers. Whipped toppings. Processed cheese and cheese spreads. Fats and oils Butter. Stick margarine. Lard. Shortening. Ghee. Bacon fat. Tropical oils, such as coconut, palm kernel, or palm oil. Seasonings and condiments Onion salt, garlic salt, seasoned salt, table salt, and sea salt. Worcestershire sauce. Tartar sauce. Barbecue sauce. Teriyaki sauce. Soy sauce, including reduced-sodium. Steak sauce. Canned and packaged gravies. Fish sauce. Oyster sauce. Cocktail sauce. Store-bought horseradish. Ketchup. Mustard. Meat flavorings and tenderizers. Bouillon cubes. Hot sauces. Pre-made or packaged marinades. Pre-made or packaged taco seasonings. Relishes. Regular salad dressings. Other foods Salted popcorn and pretzels. The items listed above may not be a complete list of foods and beverages you should avoid. Contact a dietitian for more information. Where to find more information  National Heart, Lung, and Blood Institute: PopSteam.is  American Heart Association: www.heart.org  Academy of Nutrition and Dietetics: www.eatright.org  National Kidney Foundation: www.kidney.org Summary  The DASH eating plan is a healthy eating plan that has been shown to reduce high blood pressure (hypertension). It may also reduce your risk for type 2  diabetes, heart disease, and stroke.  When on the DASH eating plan, aim to eat more fresh fruits and vegetables, whole grains, lean proteins, low-fat dairy, and heart-healthy fats.  With the DASH eating plan, you should limit salt (sodium) intake to 2,300 mg a day. If you have hypertension, you may need to reduce your sodium intake to 1,500 mg a day.  Work with your health care provider or dietitian to adjust your eating plan to your individual calorie needs. This information is not intended to replace advice given to you by your health care provider. Make sure you discuss any questions you have with your health care provider. Document Revised: 12/07/2018 Document Reviewed: 12/07/2018 Elsevier Patient Education  2021 [employer].

## 2020-03-25 NOTE — Progress Notes (Addendum)
Chief Complaint  Patient presents with  . Follow-up   F/u  1. Leg cramps better since on K 20 mg bid  2. htn BP 140/85 her cuff today on ours 130/72 on lis 20-12.5 hct review of log BP 118/68 03/21/20 MD office to mid 140s/70s to 80s at home   3. Chronic pain low back/hip pain, neck improved ROM looking left s/p PT but 2 injections have not helped and wants to stop mobic 7.5 due to side effects concern ie ulcer and elevating BP  4. Prediabetes 6/3 09/2019 and obesity exercise limited due to #3 we cant use stimulants due to #2 and wants to try medication for weight loss 1 of her daughters struggles with weight  She brings food log and food/drink choices are healthy but weight is still 184 when she got married wt was 110 years ago  Declines to go to home wt loss management in [place] for now    Review of Systems  Constitutional: Negative for weight loss.  HENT: Negative for hearing loss.   Eyes: Negative for blurred vision.  Respiratory: Negative for shortness of breath.   Cardiovascular: Negative for chest pain.  Gastrointestinal: Negative for abdominal pain.  Musculoskeletal: Positive for back pain, joint pain and neck pain.  Skin: Negative for rash.  Neurological: Negative for headaches.  Psychiatric/Behavioral:       Sadness at times due to husbands death and tearful today   Past Medical History:  Diagnosis Date  . Allergy    i.e contact allergies [facility]; also plants, trees   . Chicken pox   . Depression   . Family history of breast cancer    BRCA negative in the past  . Family history of breast cancer   . Family history of lung cancer   . Family history of rectal cancer   . History of prediabetes   . Hypertension   . Interstitial cystitis   . Interstitial cystitis   . Prediabetes    A1C 6.3 06/14/16  . Spinal stenosis   . UTI (urinary tract infection)   . UTI (urinary tract infection)    Past Surgical History:  Procedure Laterality Date  . CATARACT  EXTRACTION     b/l Dr. Tommy Rainwater    Family History  Problem Relation Age of Onset  . Breast cancer Mother 97       s/p b/l masectomy   . Breast cancer Sister 59  . Breast cancer Maternal Aunt   . Breast cancer Maternal Grandmother   . Stroke Father        age 67   . Stroke Other        grandmother age 80   . Lupus Other   . Other Daughter        benign brain tumor  . Polycystic ovary syndrome Daughter    Social History   Socioeconomic History  . Marital status: Widowed    Spouse name: Not on file  . Number of children: Not on file  . Years of education: Not on file  . Highest education level: Not on file  Occupational History  . Not on file  Tobacco Use  . Smoking status: Former Research scientist (life sciences)  . Smokeless tobacco: Never Used  . Tobacco comment: smoked 4-5 years in college then quit  Substance and Sexual Activity  . Alcohol use: No  . Drug use: No  . Sexual activity: Not on file  Other Topics Concern  . Not on file  Social History Narrative   1 daughter and 1 son    3 grandsons    Married to high school sweet heart (husband) x 66 years dx'ed with prostate cancer in 2015/04/04 he died Jun 03, 2017    Retired Engineer, manufacturing education [employer]   Social Determinants of Health   Financial Resource Strain: [place]   . Difficulty of Paying Living Expenses: Not hard at all  Food Insecurity: No Food Insecurity  . Worried About Charity fundraiser in the Last Year: Never true  . Ran Out of Food in the Last Year: Never true  Transportation Needs: No Transportation Needs  . Lack of Transportation (Medical): No  . Lack of Transportation (Non-Medical): No  Physical Activity: Insufficiently Active  . Days of Exercise per Week: 1 day  . Minutes of Exercise per Session: 60 min  Stress: No Stress Concern Present  . Feeling of Stress : Not at all  Social Connections: Unknown  . Frequency of Communication with Friends and Family: More than three times a week  . Frequency of Social Gatherings  with Friends and Family: More than three times a week  . Attends Religious Services: Not on file  . Active Member of Clubs or Organizations: Yes  . Attends Archivist Meetings: 1 to 4 times per year  . Marital Status: Married  Human resources officer Violence: Not At Risk  . Fear of Current or Ex-Partner: No  . Emotionally Abused: No  . Physically Abused: No  . Sexually Abused: No   Current Meds  Medication Sig  . Azelastine-Fluticasone 137-50 MCG/ACT SUSP 1 spray bid  . cetirizine (ZYRTEC) 10 MG tablet Take by mouth.  . Cholecalciferol (VITAMIN D3 PO) Take 5,000 Units by mouth.  . Insulin Pen Needle (PEN NEEDLES) 30G X 8 MM MISC 1 Device by Does not apply route once a week.  Marland Kitchen lisinopril-hydrochlorothiazide (ZESTORETIC) 20-12.5 MG tablet Take 1 tablet by mouth daily.  . Multiple Vitamin (MULTIVITAMIN) capsule Take 1 capsule by mouth daily.  . Olopatadine HCl 0.2 % SOLN One drop to each eye daily  . ondansetron (ZOFRAN) 4 MG tablet Take 1 tablet (4 mg total) by mouth every 8 (eight) hours as needed for nausea or vomiting.  . potassium chloride (MICRO-K) 10 MEQ CR capsule Take 2 capsules (20 mEq total) by mouth 2 (two) times daily.  . Semaglutide-Weight Management (WEGOVY) 0.25 MG/0.5ML SOAJ Inject 0.25 mg into the skin once a week. Week 1-4  . Semaglutide-Weight Management (WEGOVY) 0.5 MG/0.5ML SOAJ Inject 0.5 mg into the skin once a week. Week 5-8  . Semaglutide-Weight Management (WEGOVY) 1 MG/0.5ML SOAJ Inject 1 mg into the skin once a week. Week 9-12  . Semaglutide-Weight Management (WEGOVY) 1.7 MG/0.75ML SOAJ Inject 1.7 mg into the skin once a week. Week 13-16  . Semaglutide-Weight Management (WEGOVY) 2.4 MG/0.75ML SOAJ Inject 2.4 mg into the skin once a week. Week 17 and beyond  . [DISCONTINUED] meloxicam (MOBIC) 7.5 MG tablet Take by mouth.   Allergies  Allergen Reactions  . Benzalkonium Chloride Rash  . Cetyl Alcohol Rash  . Codeine Rash  . Lanolin Rash  . Neomycin Rash   . Nickel Rash  . Propylene Glycol Rash   Recent Results (from the past 2158-04-04 hour(s))  Cologuard     Status: None   Collection Time: 02/12/20 12:00 AM  Result Value Ref Range   Cologuard Negative Negative  Iron and TIBC     Status: None   Collection  Time: 02/21/20 12:00 AM  Result Value Ref Range   Total Iron Binding Capacity 282 250 - 450 ug/dL   UIBC 225 118 - 369 ug/dL   Iron 57 27 - 139 ug/dL   Iron Saturation 20 15 - 55 %  Urine Culture     Status: None   Collection Time: 02/21/20  1:23 PM   Urine  Result Value Ref Range   Urine Culture, Routine Final report    Organism ID, Bacteria Comment     Comment: Mixed urogenital flora 10,000-25,000 colony forming units per mL   Comprehensive metabolic panel     Status: Abnormal   Collection Time: 02/21/20  1:52 PM  Result Value Ref Range   Sodium 136 135 - 145 mEq/L   Potassium 3.0 (L) 3.5 - 5.1 mEq/L   Chloride 99 96 - 112 mEq/L   CO2 31 19 - 32 mEq/L   Glucose, Bld 124 (H) 70 - 99 mg/dL   BUN 15 6 - 23 mg/dL   Creatinine, Ser 0.82 0.40 - 1.20 mg/dL   Total Bilirubin 0.5 0.2 - 1.2 mg/dL   Alkaline Phosphatase 62 39 - 117 U/L   AST 22 0 - 37 U/L   ALT 25 0 - 35 U/L   Total Protein 7.5 6.0 - 8.3 g/dL   Albumin 4.3 3.5 - 5.2 g/dL   GFR 71.87 >60.00 mL/min    Comment: Calculated using the CKD-EPI Creatinine Equation (2021)   Calcium 9.6 8.4 - 10.5 mg/dL  CBC with Differential/Platelet     Status: Abnormal   Collection Time: 02/21/20  1:52 PM  Result Value Ref Range   WBC 7.0 4.0 - 10.5 K/uL   RBC 4.50 3.87 - 5.11 Mil/uL   Hemoglobin 12.6 12.0 - 15.0 g/dL   HCT 37.8 36.0 - 46.0 %   MCV 84.1 78.0 - 100.0 fl   MCHC 33.4 30.0 - 36.0 g/dL   RDW 12.8 11.5 - 15.5 %   Platelets 309.0 150.0 - 400.0 K/uL   Neutrophils Relative % 66.2 43.0 - 77.0 %   Lymphocytes Relative 16.5 12.0 - 46.0 %   Monocytes Relative 12.4 (H) 3.0 - 12.0 %   Eosinophils Relative 3.6 0.0 - 5.0 %   Basophils Relative 1.3 0.0 - 3.0 %   Neutro Abs 4.6  1.4 - 7.7 K/uL   Lymphs Abs 1.1 0.7 - 4.0 K/uL   Monocytes Absolute 0.9 0.1 - 1.0 K/uL   Eosinophils Absolute 0.3 0.0 - 0.7 K/uL   Basophils Absolute 0.1 0.0 - 0.1 K/uL  B12     Status: None   Collection Time: 02/21/20  1:52 PM  Result Value Ref Range   Vitamin B-12 291 211 - 911 pg/mL   Objective  Body mass index is 31.69 kg/m. Wt Readings from Last 3 Encounters:  03/25/20 184 lb 9.6 oz (83.7 kg)  02/21/20 184 lb (83.5 kg)  02/20/20 184 lb (83.5 kg)   Temp Readings from Last 3 Encounters:  03/25/20 98.2 F (36.8 C) (Oral)  02/21/20 98.2 F (36.8 C) (Oral)  09/25/19 98.3 F (36.8 C) (Oral)   BP Readings from Last 3 Encounters:  03/25/20 130/72  02/21/20 140/84  09/25/19 126/76   Pulse Readings from Last 3 Encounters:  03/25/20 92  02/21/20 99  09/25/19 78    Physical Exam Vitals and nursing note reviewed.  Constitutional:      Appearance: Normal appearance. She is well-developed and well-groomed. She is obese.  HENT:  Head: Normocephalic and atraumatic.  Eyes:     Conjunctiva/sclera: Conjunctivae normal.     Pupils: Pupils are equal, round, and reactive to light.  Cardiovascular:     Rate and Rhythm: Normal rate and regular rhythm.     Heart sounds: Normal heart sounds. No murmur heard.   Pulmonary:     Effort: Pulmonary effort is normal.     Breath sounds: Normal breath sounds.  Skin:    General: Skin is warm and dry.  Neurological:     General: No focal deficit present.     Mental Status: She is alert and oriented to person, place, and time. Mental status is at baseline.     Gait: Gait normal.  Psychiatric:        Attention and Perception: Attention and perception normal.        Mood and Affect: Mood and affect normal.        Speech: Speech normal.        Behavior: Behavior normal. Behavior is cooperative.        Thought Content: Thought content normal.        Cognition and Memory: Cognition and memory normal.        Judgment: Judgment normal.      Comments: teaful on exam today when disc. Husband who died though overall doing better than when he 1st passed        Assessment  Plan  Hypokalemia with leg cramps but better since K replaced - Plan: Basic Metabolic Panel (BMET), Magnesium Given High K food list   Obesity (BMI 30-39.9)/prediabetes - Plan: Semaglutide-Weight Management (WEGOVY) 0.25 MG/0.5ML SOAJ, Semaglutide-Weight Management (WEGOVY) 0.5 MG/0.5ML SOAJ, Semaglutide-Weight Management (WEGOVY) 1 MG/0.5ML SOAJ, Semaglutide-Weight Management (WEGOVY) 1.7 MG/0.75ML SOAJ, Semaglutide-Weight Management (WEGOVY) 2.4 MG/0.75ML SOAJ, Insulin Pen Needle (PEN NEEDLES) 30G X 8 MM MISC Exercise limited due to chronic hip/back/neck pain  Doing healthy diet choices  Options limited due to htn will avoid stimulants  Hyperlipidemia, unspecified hyperlipidemia type - Plan: Lipid panel  Nausea - Plan: ondansetron (ZOFRAN) 4 MG tablet For prn with wegovy if approved by insurance today   Hypertension with AKI nausea and hyperca On lis/hct 20-12.5 will stop as of 05/11/20 she has been on this combo x 20 years and add  norvasc 2.5 mg qd  If BP elevated consider titration ccb She was on mobic per outside provider and we stopped also on wegovy for weight loss may have caused AKI but did cause constipation will stop  Refer [place] [facility] in [place] labs not improving Results for GLENDENE, WYER (MRN [id number]) as of 05/11/2020 13:49  Ref. Range 02/21/2020 13:52 03/25/2020 14:34 04/16/2020 11:25 05/07/2020 11:25 05/07/2020 11:25  Sodium Latest Ref Range: 135 - 145 mEq/L 136 135 135 134 (L)   Potassium Latest Ref Range: 3.5 - 5.1 mEq/L 3.0 (L) 4.2 4.5 4.2   Chloride Latest Ref Range: 96 - 112 mEq/L 99 96 95 (L) 95 (L)   CO2 Latest Ref Range: 19 - 32 mEq/L _0 Glucose Latest Ref Range: 70 - 99 mg/dL 124 (H) 102 (H) 105 (H) 108 (H)   BUN Latest Ref Range: 6 - 23 mg/dL _1 Creatinine Latest Ref Range: 0.40 - 1.20 mg/dL  0.82 0.99 1.03 1.37 (H)   Calcium Latest Ref Range: 8.7 - 10.3 mg/dL 9.6 9.9 11.1 (H) 10.5 10.4 (H)  Magnesium Latest Ref Range: 1.5 - 2.5 mg/dL  1.7  Alkaline Phosphatase Latest Ref Range: 39 - 117 U/L 62      Albumin Latest Ref Range: 3.5 - 5.2 g/dL 4.3      AST Latest Ref Range: 0 - 37 U/L 22      ALT Latest Ref Range: 0 - 35 U/L 25      Total Protein Latest Ref Range: 6.0 - 8.5 g/dL 7.5    7.0  Total Bilirubin Latest Ref Range: 0.2 - 1.2 mg/dL 0.5      GFR Latest Ref Range: >60.00 mL/min 71.87 57.29 (L) 54.60 (L) 38.76 (L)    Results for BOBBY, RAGAN (MRN [id number]) as of 05/11/2020 13:49  Ref. Range 05/07/2020 11:25 05/07/2020 11:25  MICROALB/CREAT RATIO Latest Ref Range: 0 - 29 mg/g creat 10   VITD Latest Ref Range: 30.00 - 100.00 ng/mL 43.88   Interpretation Unknown  Comment  Albumin ELP Latest Ref Range: 2.9 - 4.4 g/dL  3.8  Alpha 1 Latest Ref Range: 0.0 - 0.4 g/dL  0.3  Alpha 2 Latest Ref Range: 0.4 - 1.0 g/dL  1.1 (H)  Beta Latest Ref Range: 0.7 - 1.3 g/dL  1.1  Globulin, Total Latest Ref Range: 2.2 - 3.9 g/dL  3.2  A/G Ratio Latest Ref Range: 0.7 - 1.7   1.2  Gamma Globulin Latest Ref Range: 0.4 - 1.8 g/dL  0.8  M-SPIKE, % Latest Ref Range: Not Observed g/dL  Not Observed  Please Note: Unknown  Comment  Glucose Latest Ref Range: 70 - 99 mg/dL 108 (H)   PTH, Intact Latest Ref Range: 15 - 65 pg/mL  17  PTH Interp Unknown  Comment   Chronic neck/hip/low back pain 2 injections w/o relief  Will stop mobic due to c/w side effects I.e elevated BP  Tylenol prn  F/u Dr. Sharlet Salina Consider tramadol chronically in future if pain worsening with abnormal imaging findings   HM Flu utdutd Tdap had 07/22/15 pna 23 11/20/14 prevnarutd Consider shingrix in futurewill Rx to pharmacy when pt ready disc today 02/21/20 and will let me know when ready 3/3 covid 19 vaccines Hep Bimmune, MMR immune D3 5000 IU daily  Last pap 07/22/15 negative HPV negout of age  window  Mammogram8/24/21neg7/14/20 genetics test + sister and 1/2 tested +nieces -ordered   Ordered dexa   cologaurdneg 12/30/16 neg 02/12/20   [place] [facility] [place] f/u in 1 yearsaw in 07/2018 and f/u in 1 yearsaw 07/2019 Hassan Rowan normal no bxs/lng -h/o eczema has steroid cream to use -will ask they see pt for above as of 02/21/20   rec healthy diet and exercise   Provider: Dr. Olivia Mackie McLean-Scocuzza-Internal Medicine

## 2020-03-26 DIAGNOSIS — I152 Hypertension secondary to endocrine disorders: Secondary | ICD-10-CM | POA: Insufficient documentation

## 2020-03-26 DIAGNOSIS — I10 Essential (primary) hypertension: Secondary | ICD-10-CM | POA: Insufficient documentation

## 2020-03-26 DIAGNOSIS — E1159 Type 2 diabetes mellitus with other circulatory complications: Secondary | ICD-10-CM | POA: Insufficient documentation

## 2020-03-26 LAB — LIPID PANEL
Cholesterol: 187 mg/dL (ref 0–200)
HDL: 53.7 mg/dL (ref >39.00)
LDL Cholesterol: 110 mg/dL — ABNORMAL HIGH (ref 0–99)
NonHDL: 133.58
Total CHOL/HDL Ratio: 3
Triglycerides: 116 mg/dL (ref 0.0–149.0)
VLDL: 23.2 mg/dL (ref 0.0–40.0)

## 2020-03-26 LAB — BASIC METABOLIC PANEL
BUN: 15 mg/dL (ref 6–23)
CO2: 29 mEq/L (ref 19–32)
Calcium: 9.9 mg/dL (ref 8.4–10.5)
Chloride: 96 mEq/L (ref 96–112)
Creatinine, Ser: 0.99 mg/dL (ref 0.40–1.20)
GFR: 57.29 mL/min — ABNORMAL LOW (ref >60.00)
Glucose, Bld: 102 mg/dL — ABNORMAL HIGH (ref 70–99)
Potassium: 4.2 mEq/L (ref 3.5–5.1)
Sodium: 135 mEq/L (ref 135–145)

## 2020-03-26 LAB — HEMOGLOBIN A1C: Hgb A1c MFr Bld: 6.5 % (ref 4.6–6.5)

## 2020-03-26 LAB — MAGNESIUM: Magnesium: 1.7 mg/dL (ref 1.5–2.5)

## 2020-03-27 DIAGNOSIS — M6281 Muscle weakness (generalized): Secondary | ICD-10-CM | POA: Diagnosis not present

## 2020-03-27 DIAGNOSIS — M47812 Spondylosis without myelopathy or radiculopathy, cervical region: Secondary | ICD-10-CM | POA: Diagnosis not present

## 2020-03-30 ENCOUNTER — Other Ambulatory Visit: Payer: Self-pay

## 2020-03-30 ENCOUNTER — Other Ambulatory Visit: Payer: Self-pay | Admitting: Internal Medicine

## 2020-03-30 DIAGNOSIS — E876 Hypokalemia: Secondary | ICD-10-CM

## 2020-03-30 DIAGNOSIS — R7989 Other specified abnormal findings of blood chemistry: Secondary | ICD-10-CM

## 2020-04-07 ENCOUNTER — Encounter: Payer: Self-pay | Admitting: Internal Medicine

## 2020-04-10 DIAGNOSIS — M47812 Spondylosis without myelopathy or radiculopathy, cervical region: Secondary | ICD-10-CM | POA: Diagnosis not present

## 2020-04-10 DIAGNOSIS — M6281 Muscle weakness (generalized): Secondary | ICD-10-CM | POA: Diagnosis not present

## 2020-04-16 ENCOUNTER — Other Ambulatory Visit: Payer: Self-pay

## 2020-04-16 ENCOUNTER — Other Ambulatory Visit (INDEPENDENT_AMBULATORY_CARE_PROVIDER_SITE_OTHER): Payer: Medicare PPO

## 2020-04-16 DIAGNOSIS — R7989 Other specified abnormal findings of blood chemistry: Secondary | ICD-10-CM | POA: Diagnosis not present

## 2020-04-16 DIAGNOSIS — E876 Hypokalemia: Secondary | ICD-10-CM

## 2020-04-16 LAB — BASIC METABOLIC PANEL
BUN: 20 mg/dL (ref 6–23)
CO2: 31 mEq/L (ref 19–32)
Calcium: 11.1 mg/dL — ABNORMAL HIGH (ref 8.4–10.5)
Chloride: 95 mEq/L — ABNORMAL LOW (ref 96–112)
Creatinine, Ser: 1.03 mg/dL (ref 0.40–1.20)
GFR: 54.6 mL/min — ABNORMAL LOW (ref >60.00)
Glucose, Bld: 105 mg/dL — ABNORMAL HIGH (ref 70–99)
Potassium: 4.5 mEq/L (ref 3.5–5.1)
Sodium: 135 mEq/L (ref 135–145)

## 2020-04-23 ENCOUNTER — Encounter: Payer: Self-pay | Admitting: Internal Medicine

## 2020-04-23 NOTE — Addendum Note (Signed)
Addended by: Quentin Ore on: 04/23/2020 01:03 PM   Modules accepted: Orders

## 2020-04-27 NOTE — Addendum Note (Signed)
Addended by: Quentin Ore on: 04/27/2020 12:18 AM   Modules accepted: Orders

## 2020-04-27 NOTE — Addendum Note (Signed)
Addended by: Quentin Ore on: 04/27/2020 12:15 AM   Modules accepted: Orders

## 2020-05-07 ENCOUNTER — Other Ambulatory Visit (INDEPENDENT_AMBULATORY_CARE_PROVIDER_SITE_OTHER): Payer: Medicare PPO

## 2020-05-07 ENCOUNTER — Encounter: Payer: Self-pay | Admitting: Internal Medicine

## 2020-05-07 ENCOUNTER — Other Ambulatory Visit: Payer: Self-pay

## 2020-05-07 DIAGNOSIS — N179 Acute kidney failure, unspecified: Secondary | ICD-10-CM | POA: Diagnosis not present

## 2020-05-07 DIAGNOSIS — E559 Vitamin D deficiency, unspecified: Secondary | ICD-10-CM

## 2020-05-07 LAB — VITAMIN D 25 HYDROXY (VIT D DEFICIENCY, FRACTURES): VITD: 43.88 ng/mL (ref 30.00–100.00)

## 2020-05-07 LAB — BASIC METABOLIC PANEL
BUN: 21 mg/dL (ref 6–23)
CO2: 31 mEq/L (ref 19–32)
Calcium: 10.5 mg/dL (ref 8.4–10.5)
Chloride: 95 mEq/L — ABNORMAL LOW (ref 96–112)
Creatinine, Ser: 1.37 mg/dL — ABNORMAL HIGH (ref 0.40–1.20)
GFR: 38.76 mL/min — ABNORMAL LOW (ref >60.00)
Glucose, Bld: 108 mg/dL — ABNORMAL HIGH (ref 70–99)
Potassium: 4.2 mEq/L (ref 3.5–5.1)
Sodium: 134 mEq/L — ABNORMAL LOW (ref 135–145)

## 2020-05-08 ENCOUNTER — Telehealth: Payer: Self-pay | Admitting: Internal Medicine

## 2020-05-08 NOTE — Telephone Encounter (Signed)
I would please stop Reginal Lutes as Lavenia Atlas previously advised

## 2020-05-08 NOTE — Telephone Encounter (Signed)
Please see 05/07/20 Patient message encounter

## 2020-05-11 ENCOUNTER — Other Ambulatory Visit: Payer: Self-pay | Admitting: Internal Medicine

## 2020-05-11 ENCOUNTER — Encounter: Payer: Self-pay | Admitting: Internal Medicine

## 2020-05-11 DIAGNOSIS — N179 Acute kidney failure, unspecified: Secondary | ICD-10-CM | POA: Insufficient documentation

## 2020-05-11 DIAGNOSIS — K760 Fatty (change of) liver, not elsewhere classified: Secondary | ICD-10-CM | POA: Insufficient documentation

## 2020-05-11 DIAGNOSIS — E669 Obesity, unspecified: Secondary | ICD-10-CM

## 2020-05-11 DIAGNOSIS — E1159 Type 2 diabetes mellitus with other circulatory complications: Secondary | ICD-10-CM

## 2020-05-11 DIAGNOSIS — I10 Essential (primary) hypertension: Secondary | ICD-10-CM

## 2020-05-11 DIAGNOSIS — R7303 Prediabetes: Secondary | ICD-10-CM

## 2020-05-11 DIAGNOSIS — I152 Hypertension secondary to endocrine disorders: Secondary | ICD-10-CM

## 2020-05-11 LAB — PTH, INTACT AND CALCIUM
Calcium: 10.4 mg/dL — ABNORMAL HIGH (ref 8.7–10.3)
PTH: 17 pg/mL (ref 15–65)

## 2020-05-11 LAB — PROTEIN ELECTROPHORESIS, SERUM
A/G Ratio: 1.2 (ref 0.7–1.7)
Albumin ELP: 3.8 g/dL (ref 2.9–4.4)
Alpha 1: 0.3 g/dL (ref 0.0–0.4)
Alpha 2: 1.1 g/dL — ABNORMAL HIGH (ref 0.4–1.0)
Beta: 1.1 g/dL (ref 0.7–1.3)
Gamma Globulin: 0.8 g/dL (ref 0.4–1.8)
Globulin, Total: 3.2 g/dL (ref 2.2–3.9)
Total Protein: 7 g/dL (ref 6.0–8.5)

## 2020-05-11 MED ORDER — AMLODIPINE BESYLATE 2.5 MG PO TABS: 2.5000 mg | ORAL_TABLET | Freq: Every day | ORAL | 3 refills | Status: DC

## 2020-05-11 NOTE — Addendum Note (Signed)
Addended by: Quentin Ore on: 05/11/2020 01:53 PM   Modules accepted: Orders

## 2020-05-12 LAB — PROTEIN ELECTROPHORESIS, URINE REFLEX
Albumin ELP, Urine: 37.2 %
Alpha-1-Globulin, U: 2.9 %
Alpha-2-Globulin, U: 7.7 %
Beta Globulin, U: 37.3 %
Gamma Globulin, U: 14.8 %
Protein, Ur: 11.5 mg/dL

## 2020-05-12 LAB — MICROALBUMIN / CREATININE URINE RATIO
Creatinine, Urine: 143.6 mg/dL
Microalb/Creat Ratio: 10 mg/g creat (ref 0–29)
Microalbumin, Urine: 14.5 ug/mL

## 2020-05-12 LAB — SODIUM, URINE, RANDOM: Sodium, Ur: 55 mmol/L

## 2020-05-14 DIAGNOSIS — M62830 Muscle spasm of back: Secondary | ICD-10-CM | POA: Diagnosis not present

## 2020-05-14 DIAGNOSIS — M62838 Other muscle spasm: Secondary | ICD-10-CM | POA: Diagnosis not present

## 2020-05-14 DIAGNOSIS — M47812 Spondylosis without myelopathy or radiculopathy, cervical region: Secondary | ICD-10-CM | POA: Diagnosis not present

## 2020-05-14 DIAGNOSIS — M5030 Other cervical disc degeneration, unspecified cervical region: Secondary | ICD-10-CM | POA: Diagnosis not present

## 2020-05-14 DIAGNOSIS — M1611 Unilateral primary osteoarthritis, right hip: Secondary | ICD-10-CM | POA: Diagnosis not present

## 2020-05-14 DIAGNOSIS — M5416 Radiculopathy, lumbar region: Secondary | ICD-10-CM | POA: Diagnosis not present

## 2020-05-14 DIAGNOSIS — M5136 Other intervertebral disc degeneration, lumbar region: Secondary | ICD-10-CM | POA: Diagnosis not present

## 2020-05-18 ENCOUNTER — Encounter: Payer: Self-pay | Admitting: Internal Medicine

## 2020-05-26 DIAGNOSIS — N1832 Chronic kidney disease, stage 3b: Secondary | ICD-10-CM | POA: Diagnosis not present

## 2020-05-26 DIAGNOSIS — N179 Acute kidney failure, unspecified: Secondary | ICD-10-CM | POA: Diagnosis not present

## 2020-05-26 DIAGNOSIS — I10 Essential (primary) hypertension: Secondary | ICD-10-CM | POA: Diagnosis not present

## 2020-05-27 ENCOUNTER — Telehealth: Payer: Self-pay | Admitting: Internal Medicine

## 2020-05-27 ENCOUNTER — Encounter: Payer: Self-pay | Admitting: Internal Medicine

## 2020-05-27 ENCOUNTER — Other Ambulatory Visit: Payer: Self-pay | Admitting: Internal Medicine

## 2020-05-27 DIAGNOSIS — N179 Acute kidney failure, unspecified: Secondary | ICD-10-CM

## 2020-05-27 NOTE — Telephone Encounter (Signed)
sch bmet with GFR 07/01/20 11am - 1pm  Thanks

## 2020-05-28 NOTE — Telephone Encounter (Signed)
Lvm for pt to return call and schedule lab as requested

## 2020-05-29 ENCOUNTER — Telehealth: Payer: Self-pay | Admitting: Internal Medicine

## 2020-05-29 NOTE — Telephone Encounter (Signed)
.  Triageb

## 2020-06-01 ENCOUNTER — Encounter: Payer: Self-pay | Admitting: Internal Medicine

## 2020-06-02 DIAGNOSIS — M1611 Unilateral primary osteoarthritis, right hip: Secondary | ICD-10-CM | POA: Diagnosis not present

## 2020-06-02 NOTE — Telephone Encounter (Signed)
Please advise, Patient last seen 03/25/20

## 2020-06-03 ENCOUNTER — Other Ambulatory Visit: Payer: Self-pay | Admitting: Internal Medicine

## 2020-06-03 ENCOUNTER — Encounter: Payer: Self-pay | Admitting: Internal Medicine

## 2020-06-03 DIAGNOSIS — I152 Hypertension secondary to endocrine disorders: Secondary | ICD-10-CM

## 2020-06-03 DIAGNOSIS — I10 Essential (primary) hypertension: Secondary | ICD-10-CM

## 2020-06-03 MED ORDER — AMLODIPINE BESYLATE 2.5 MG PO TABS: 2.5000 mg | ORAL_TABLET | Freq: Two times a day (BID) | ORAL | 3 refills | Status: DC

## 2020-06-03 MED ORDER — NEBIVOLOL HCL 2.5 MG PO TABS: 2.5000 mg | ORAL_TABLET | Freq: Every day | ORAL | 3 refills | Status: DC

## 2020-06-03 NOTE — Telephone Encounter (Signed)
Original message was re-sent to Dr French Ana McLean-Scocuzza

## 2020-06-03 NOTE — Telephone Encounter (Signed)
Patient sent additional mychart message to inquire status of this message. Patient informed this has been sent to Dr French Ana McLean-Scocuzza with a turn around time of possibly 72 hours.   Please advise

## 2020-06-12 ENCOUNTER — Other Ambulatory Visit: Payer: Self-pay

## 2020-06-12 ENCOUNTER — Ambulatory Visit: Payer: Medicare PPO | Admitting: Internal Medicine

## 2020-06-12 ENCOUNTER — Encounter: Payer: Self-pay | Admitting: Internal Medicine

## 2020-06-12 VITALS — BP 138/76 | HR 83 | Temp 97.9°F | Ht 64.0 in | Wt 184.6 lb

## 2020-06-12 DIAGNOSIS — R600 Localized edema: Secondary | ICD-10-CM | POA: Diagnosis not present

## 2020-06-12 DIAGNOSIS — I10 Essential (primary) hypertension: Secondary | ICD-10-CM | POA: Diagnosis not present

## 2020-06-12 MED ORDER — LISINOPRIL-HYDROCHLOROTHIAZIDE 20-12.5 MG PO TABS: 1.0000 | ORAL_TABLET | Freq: Every day | ORAL | 3 refills | Status: DC

## 2020-06-12 NOTE — Patient Instructions (Signed)
Goal<130/<80   If BP too low <90/<60 Cut lisinopril-hctz in 1/2  We will consider stopping bystolic in the future

## 2020-06-12 NOTE — Progress Notes (Signed)
Chief Complaint  Patient presents with  . Hypertension  . Edema   F/u  1. htn with leg/ankle edema (which are tight and uncomfortable) due to norvasc 5 mg qd on she was on lis 20 hctz 12.5 but had aki after nsaid use due to low back pain and right hip pain though had 3 injections overall 2 back and 1 hip still having hip pain but will avoid nsaid use  aki resolved sent to renal and Cr 0.99 05/26/20  She is also taking bystolic 2.5 mg for BP  BP ranges 130s-170s/80s-90s at home log and wants to resume lis hctz 20-12.5 now that kidney function back at baseline  2. Wt gain of 7 lbs    Review of Systems  Constitutional: Negative for weight loss.  HENT: Negative for hearing loss.   Eyes: Negative for blurred vision.  Respiratory: Negative for shortness of breath.   Cardiovascular: Positive for leg swelling.  Gastrointestinal: Negative for abdominal pain.  Musculoskeletal: Negative for falls and joint pain.  Skin: Negative for rash.   Past Medical History:  Diagnosis Date  . AKI (acute kidney injury) ([place])    03/2020-04/2020  . Allergy    i.e contact allergies [facility]; also plants, trees   . Chicken pox   . Depression   . Family history of breast cancer    BRCA negative in the past  . Family history of breast cancer   . Family history of lung cancer   . Family history of rectal cancer   . History of prediabetes   . Hypertension   . Interstitial cystitis   . Interstitial cystitis   . Prediabetes    A1C 6.3 06/14/16  . Spinal stenosis   . UTI (urinary tract infection)   . UTI (urinary tract infection)    Past Surgical History:  Procedure Laterality Date  . CATARACT EXTRACTION     b/l Dr. Tommy Rainwater    Family History  Problem Relation Age of Onset  . Breast cancer Mother 92       s/p b/l masectomy   . Breast cancer Sister 40  . Breast cancer Maternal Aunt   . Breast cancer Maternal Grandmother   . Stroke Father        age 17   . Stroke Other         grandmother age 21   . Lupus Other   . Other Daughter        benign brain tumor  . Polycystic ovary syndrome Daughter    Social History   Socioeconomic History  . Marital status: Widowed    Spouse name: Not on file  . Number of children: Not on file  . Years of education: Not on file  . Highest education level: Not on file  Occupational History  . Not on file  Tobacco Use  . Smoking status: Former Research scientist (life sciences)  . Smokeless tobacco: Never Used  . Tobacco comment: smoked 4-5 years in college then quit  Substance and Sexual Activity  . Alcohol use: No  . Drug use: No  . Sexual activity: Not on file  Other Topics Concern  . Not on file  Social History Narrative   1 daughter and 1 son    3 grandsons    Married to high school sweet heart (husband) x 43 years dx'ed with prostate cancer in 04-08-2015 he died 06/07/17    Retired Event organiser education [employer]   Social Determinants of  Health   Financial Resource Strain: Low Risk   . Difficulty of Paying Living Expenses: Not hard at all  Food Insecurity: No Food Insecurity  . Worried About Programme researcher, broadcasting/film/video in the Last Year: Never true  . Ran Out of Food in the Last Year: Never true  Transportation Needs: No Transportation Needs  . Lack of Transportation (Medical): No  . Lack of Transportation (Non-Medical): No  Physical Activity: Insufficiently Active  . Days of Exercise per Week: 1 day  . Minutes of Exercise per Session: 60 min  Stress: No Stress Concern Present  . Feeling of Stress : Not at all  Social Connections: Unknown  . Frequency of Communication with Friends and Family: More than three times a week  . Frequency of Social Gatherings with Friends and Family: More than three times a week  . Attends Religious Services: Not on file  . Active Member of Clubs or Organizations: Yes  . Attends Banker Meetings: 1 to 4 times per year  . Marital Status: Married  Catering manager Violence: Not At Risk  . Fear  of Current or Ex-Partner: No  . Emotionally Abused: No  . Physically Abused: No  . Sexually Abused: No   Current Meds  Medication Sig  . Azelastine-Fluticasone 137-50 MCG/ACT SUSP 1 spray bid  . cetirizine (ZYRTEC) 10 MG tablet Take by mouth.  . Cholecalciferol (VITAMIN D3 PO) Take 5,000 Units by mouth.  Marland Kitchen lisinopril-hydrochlorothiazide (ZESTORETIC) 20-12.5 MG tablet Take 1 tablet by mouth daily. In am if BP too low <90<60 cut pill in 1/2  . Multiple Vitamin (MULTIVITAMIN) capsule Take 1 capsule by mouth daily.  . nebivolol (BYSTOLIC) 2.5 MG tablet Take 1 tablet (2.5 mg total) by mouth daily.  . [DISCONTINUED] amLODipine (NORVASC) 2.5 MG tablet Take 1 tablet (2.5 mg total) by mouth in the morning and at bedtime. Take 2nd dose if BP >130/>80   Allergies  Allergen Reactions  . Norvasc [Amlodipine]     Leg swelling max dose    . Nsaids     AKI   . Benzalkonium Chloride Rash  . Cetyl Alcohol Rash  . Codeine Rash  . Lanolin Rash  . Neomycin Rash  . Nickel Rash  . Propylene Glycol Rash   Recent Results (from the past 2160 hour(s))  Basic Metabolic Panel (BMET)     Status: Abnormal   Collection Time: 03/25/20  2:34 PM  Result Value Ref Range   Sodium 135 135 - 145 mEq/L   Potassium 4.2 3.5 - 5.1 mEq/L   Chloride 96 96 - 112 mEq/L   CO2 29 19 - 32 mEq/L   Glucose, Bld 102 (H) 70 - 99 mg/dL   BUN 15 6 - 23 mg/dL   Creatinine, Ser 2.77 0.40 - 1.20 mg/dL   GFR 41.28 (L) >78.67 mL/min    Comment: Calculated using the CKD-EPI Creatinine Equation (2021)   Calcium 9.9 8.4 - 10.5 mg/dL  Hemoglobin E7M     Status: None   Collection Time: 03/25/20  2:34 PM  Result Value Ref Range   Hgb A1c MFr Bld 6.5 4.6 - 6.5 %    Comment: Glycemic Control Guidelines for People with Diabetes:Non Diabetic:  <6%Goal of Therapy: <7%Additional Action Suggested:  >8%   Lipid panel     Status: Abnormal   Collection Time: 03/25/20  2:34 PM  Result Value Ref Range   Cholesterol 187 0 - 200 mg/dL     Comment: ATP III Classification  Desirable:  < 200 mg/dL               Borderline High:  200 - 239 mg/dL          High:  > = 240 mg/dL   Triglycerides 116.0 0.0 - 149.0 mg/dL    Comment: Normal:  <150 mg/dLBorderline High:  150 - 199 mg/dL   HDL 53.70 >39.00 mg/dL   VLDL 23.2 0.0 - 40.0 mg/dL   LDL Cholesterol 110 (H) 0 - 99 mg/dL   Total CHOL/HDL Ratio 3     Comment:                Men          Women1/2 Average Risk     3.4          3.3Average Risk          5.0          4.42X Average Risk          9.6          7.13X Average Risk          15.0          11.0                       NonHDL 133.58     Comment: NOTE:  Non-HDL goal should be 30 mg/dL higher than patient's LDL goal (i.e. LDL goal of < 70 mg/dL, would have non-HDL goal of < 100 mg/dL)  Magnesium     Status: None   Collection Time: 03/25/20  2:34 PM  Result Value Ref Range   Magnesium 1.7 1.5 - 2.5 mg/dL  Basic Metabolic Panel (BMET)     Status: Abnormal   Collection Time: 04/16/20 11:25 AM  Result Value Ref Range   Sodium 135 135 - 145 mEq/L   Potassium 4.5 3.5 - 5.1 mEq/L   Chloride 95 (L) 96 - 112 mEq/L   CO2 31 19 - 32 mEq/L   Glucose, Bld 105 (H) 70 - 99 mg/dL   BUN 20 6 - 23 mg/dL   Creatinine, Ser 1.03 0.40 - 1.20 mg/dL   GFR 54.60 (L) >60.00 mL/min    Comment: Calculated using the CKD-EPI Creatinine Equation (2021)   Calcium 11.1 (H) 8.4 - 10.5 mg/dL  Sodium, urine, random     Status: None   Collection Time: 05/07/20 11:25 AM  Result Value Ref Range   Sodium, Ur 55 Not Estab. mmol/L  Microalbumin / creatinine urine ratio     Status: None   Collection Time: 05/07/20 11:25 AM  Result Value Ref Range   Creatinine, Urine 143.6 Not Estab. mg/dL   Microalbumin, Urine 14.5 Not Estab. ug/mL   Microalb/Creat Ratio 10 0 - 29 mg/g creat    Comment:                        Normal:                0 -  29                        Moderately increased: 30 - 300                        Severely increased:       >809   Basic  Metabolic Panel (BMET)  Status: Abnormal   Collection Time: 05/07/20 11:25 AM  Result Value Ref Range   Sodium 134 (L) 135 - 145 mEq/L   Potassium 4.2 3.5 - 5.1 mEq/L   Chloride 95 (L) 96 - 112 mEq/L   CO2 31 19 - 32 mEq/L   Glucose, Bld 108 (H) 70 - 99 mg/dL   BUN 21 6 - 23 mg/dL   Creatinine, Ser 1.37 (H) 0.40 - 1.20 mg/dL   GFR 38.76 (L) >60.00 mL/min    Comment: Calculated using the CKD-EPI Creatinine Equation (2021)   Calcium 10.5 8.4 - 10.5 mg/dL  VITAMIN D 25 Hydroxy (Vit-D Deficiency, Fractures)     Status: None   Collection Time: 05/07/20 11:25 AM  Result Value Ref Range   VITD 43.88 30.00 - 100.00 ng/mL  PTH, Intact and Calcium     Status: Abnormal   Collection Time: 05/07/20 11:25 AM  Result Value Ref Range   Calcium 10.4 (H) 8.7 - 10.3 mg/dL   PTH 17 15 - 65 pg/mL   PTH Interp Comment     Comment: Interpretation                 Intact PTH    Calcium                                 (pg/mL)      (mg/dL) Normal                          15 - 65     8.6 - 10.2 Primary Hyperparathyroidism         >65          >10.2 Secondary Hyperparathyroidism       >65          <10.2 Non-Parathyroid Hypercalcemia       <65          >10.2 Hypoparathyroidism                  <15          < 8.6 Non-Parathyroid Hypocalcemia    15 - 65          < 8.6   Protein Electrophoresis, Urine Rflx.     Status: None   Collection Time: 05/07/20 11:25 AM  Result Value Ref Range   Protein, Ur 11.5 Not Estab. mg/dL   Albumin ELP, Urine 37.2 %   Alpha-1-Globulin, U 2.9 %   Alpha-2-Globulin, U 7.7 %   Beta Globulin, U 37.3 %   Gamma Globulin, U 14.8 %   M Component, Ur Not Observed Not Observed %   Please Note: Comment     Comment: Protein electrophoresis scan will follow via computer, mail, or courier delivery.   Protein Electrophoresis, (serum)     Status: Abnormal   Collection Time: 05/07/20 11:25 AM  Result Value Ref Range   Total Protein 7.0 6.0 - 8.5 g/dL   Albumin ELP 3.8 2.9 - 4.4 g/dL    Alpha 1 0.3 0.0 - 0.4 g/dL   Alpha 2 1.1 (H) 0.4 - 1.0 g/dL   Beta 1.1 0.7 - 1.3 g/dL   Gamma Globulin 0.8 0.4 - 1.8 g/dL   M-Spike, % Not Observed Not Observed g/dL   Globulin, Total 3.2 2.2 - 3.9 g/dL   A/G Ratio 1.2 0.7 - 1.7   Please Note: Comment  Comment: Protein electrophoresis scan will follow via computer, mail, or courier delivery.    Interpretation: Comment     Comment: The SPE pattern demonstrates elevation of regions containing acute phase proteins suggesting an acute/subacute inflammatory response. Some conditions in which this pattern has been observed include: bacterial, viral or parasitic infection; mechanical, physical or chemical trauma; and cardiac failure. The gamma globulin region is unremarkable and evidence of monoclonal protein is not apparent.    Objective  Body mass index is 31.69 kg/m. Wt Readings from Last 3 Encounters:  06/12/20 184 lb 9.6 oz (83.7 kg)  03/25/20 184 lb 9.6 oz (83.7 kg)  02/21/20 184 lb (83.5 kg)   Temp Readings from Last 3 Encounters:  06/12/20 97.9 F (36.6 C) (Oral)  03/25/20 98.2 F (36.8 C) (Oral)  02/21/20 98.2 F (36.8 C) (Oral)   BP Readings from Last 3 Encounters:  06/12/20 138/76  03/25/20 130/72  02/21/20 140/84   Pulse Readings from Last 3 Encounters:  06/12/20 83  03/25/20 92  02/21/20 99    Physical Exam Vitals and nursing note reviewed.  Constitutional:      Appearance: Normal appearance. She is well-developed and well-groomed. She is obese.  HENT:     Head: Normocephalic and atraumatic.  Eyes:     Conjunctiva/sclera: Conjunctivae normal.     Pupils: Pupils are equal, round, and reactive to light.  Cardiovascular:     Rate and Rhythm: Normal rate and regular rhythm.     Heart sounds: Normal heart sounds. No murmur heard.   Pulmonary:     Effort: Pulmonary effort is normal.     Breath sounds: Normal breath sounds.  Abdominal:     General: Abdomen is flat. Bowel sounds are normal.   Musculoskeletal:     Right lower leg: 1+ Pitting Edema present.     Left lower leg: 1+ Pitting Edema present.  Skin:    General: Skin is warm and dry.  Neurological:     General: No focal deficit present.     Mental Status: She is alert and oriented to person, place, and time. Mental status is at baseline.     Gait: Gait normal.  Psychiatric:        Attention and Perception: Attention and perception normal.        Mood and Affect: Mood and affect normal.        Speech: Speech normal.        Behavior: Behavior normal. Behavior is cooperative.        Thought Content: Thought content normal.        Cognition and Memory: Cognition and memory normal.        Judgment: Judgment normal.     Assessment  Plan  Leg edema due to norvasc 5 mg qd  Consider stop hctz in the future and take prn lasix 20 mg qd and consider echo if leg edema does not resolve and is renal function declines again   Hypertension, unspecified type - Plan: lisinopril-hydrochlorothiazide (ZESTORETIC) 20-12.5 MG tablet  bystolic 2.5 mg qd  bmet in 2 weeks  Goal<130/<80 log BP  If BP too low <90/<60 Cut lisinopril-hctz in 1/2 with bystolic 2.5 mg qd and if still low We will consider stopping bystolic in the future   HM Flu utdutd Tdap had 07/22/15 pna 23 11/20/14 prevnarutd Consider shingrix in futurewill Rx to pharmacy when pt ready disc today 02/21/20 and will let me know when ready 3/3covid 19 vaccines Hep Bimmune, MMR immune D3  5000 IU daily  Last pap 07/22/15 negative HPV negout of age window  Mammogram8/24/21neg7/14/20 genetics test + sister and 1/2 tested +nieces -ordered   Ordereddexa   cologaurdneg 12/14/18neg 02/12/20   [facility] neg f/u in 1 yearsaw in 07/2018 and f/u in 1 yearsaw 07/2019 Hassan Rowan normal no bxs/lng -h/o eczema has steroid cream to use -will ask they see pt for above as of 02/21/20   rec healthy diet and exercise Provider: Dr.  Olivia Mackie McLean-Scocuzza-[facility] Medicine

## 2020-06-16 ENCOUNTER — Other Ambulatory Visit: Payer: Self-pay | Admitting: Physical Medicine and Rehabilitation

## 2020-06-16 DIAGNOSIS — M1611 Unilateral primary osteoarthritis, right hip: Secondary | ICD-10-CM

## 2020-06-29 ENCOUNTER — Other Ambulatory Visit: Payer: Self-pay

## 2020-06-29 ENCOUNTER — Ambulatory Visit
Admission: RE | Admit: 2020-06-29 | Discharge: 2020-06-29 | Disposition: A | Discharge status: Home / Self Care | Payer: Medicare PPO | Source: Ambulatory Visit | Attending: Physical Medicine and Rehabilitation | Admitting: Physical Medicine and Rehabilitation

## 2020-06-29 DIAGNOSIS — M1611 Unilateral primary osteoarthritis, right hip: Secondary | ICD-10-CM | POA: Insufficient documentation

## 2020-06-29 DIAGNOSIS — M25451 Effusion, right hip: Secondary | ICD-10-CM | POA: Diagnosis not present

## 2020-06-29 DIAGNOSIS — R600 Localized edema: Secondary | ICD-10-CM | POA: Diagnosis not present

## 2020-06-29 IMAGING — MR MR HIP*R* W/O CM
4 of 5 series · 27 of 40 positions shown · non-contrast
Comparison: X-ray [DATE]

CLINICAL DATA: Right hip pain for 6 months

EXAM:
MR OF THE RIGHT HIP WITHOUT CONTRAST
TECHNIQUE: Multiplanar, multisequence MR imaging was performed. No intravenous
contrast was administered.

[Series 2: T1 · coronal · right · 4.0mm · 0.59mm/px · 5 of 38 slices shown]
[im 1/38]
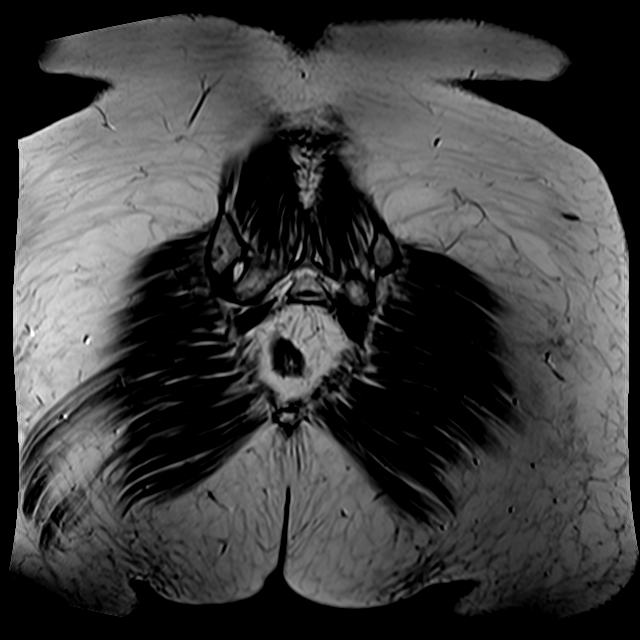
[im 5/38]
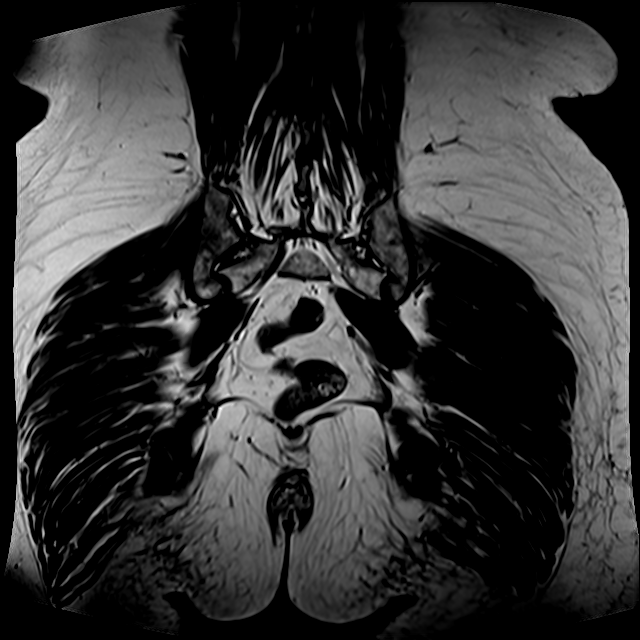
[im 10/38]
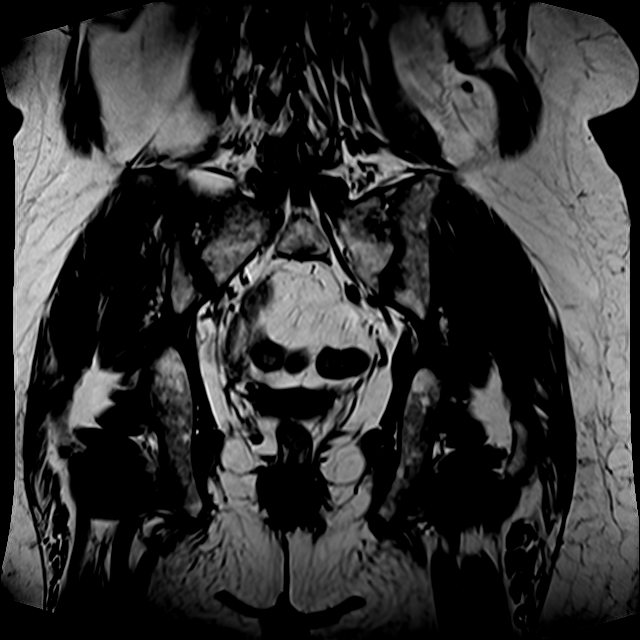
[im 19/38]
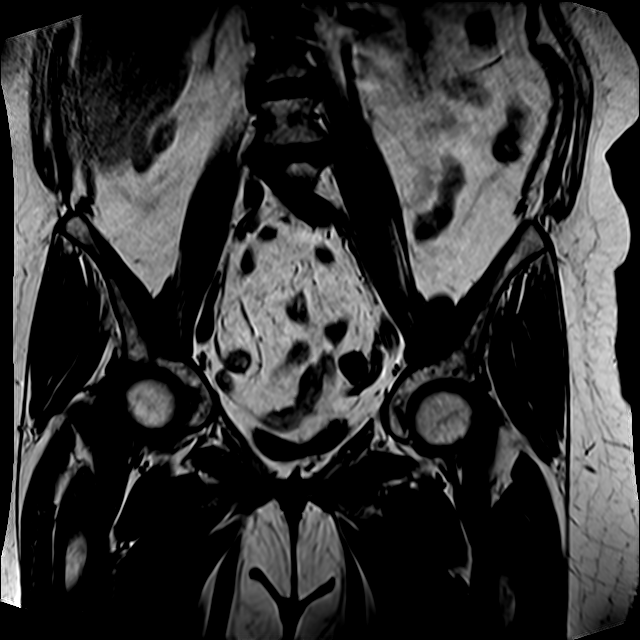
[im 33/38]
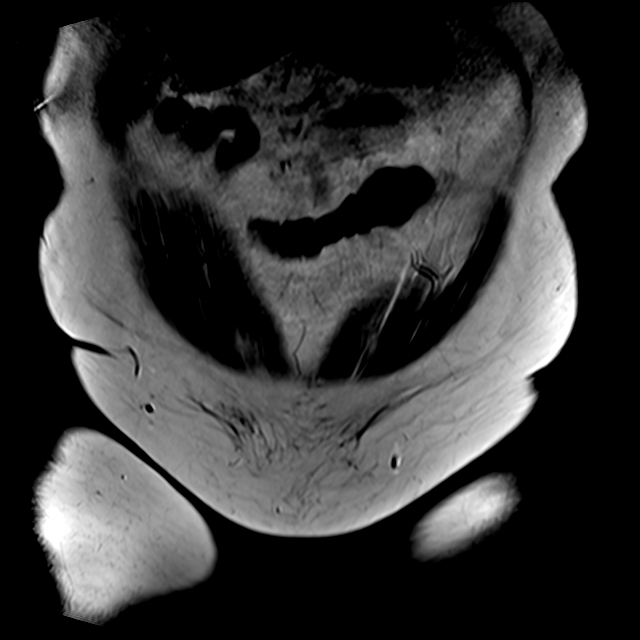

[Series 4: T2 fat-sat · axial · right · 4.0mm · 0.70mm/px · z∈[-55,+89]mm · 7 of 30 slices shown]
[im 1/30]
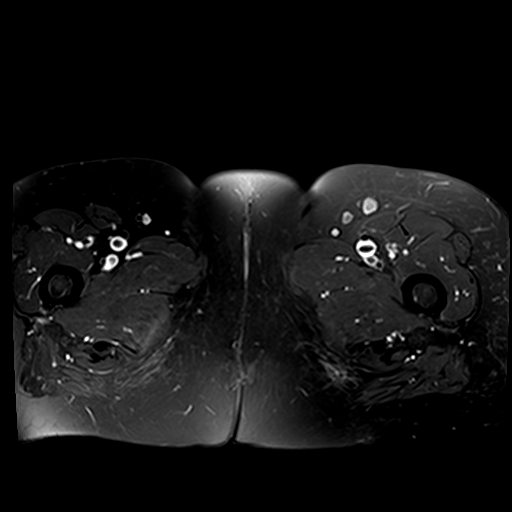
[im 5/30]
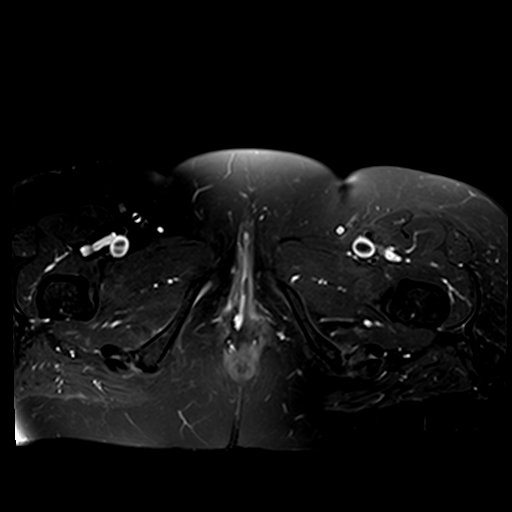
[im 10/30]
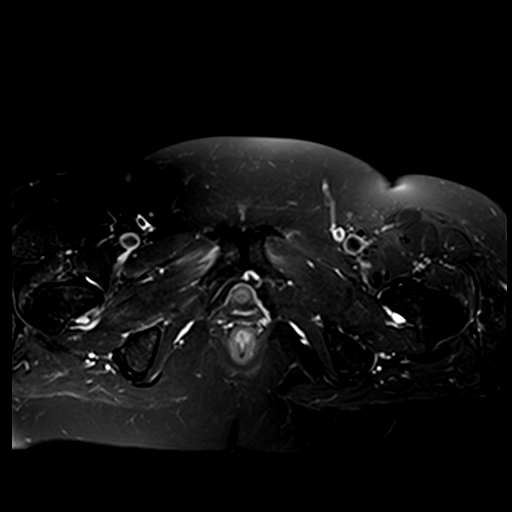
[im 15/30]
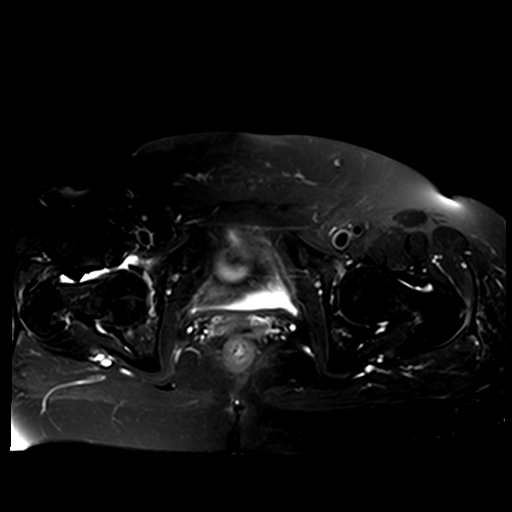
[im 20/30]
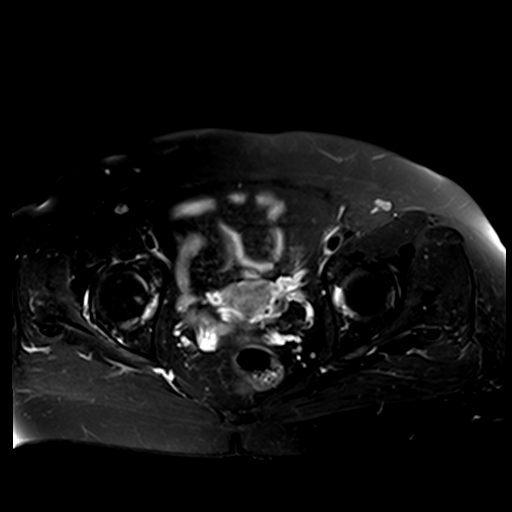
[im 25/30]
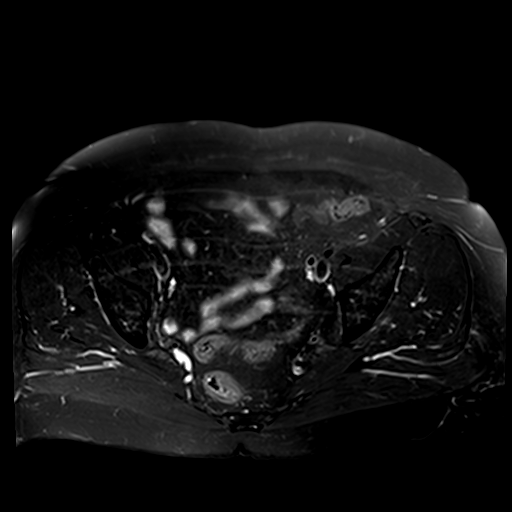
[im 30/30]
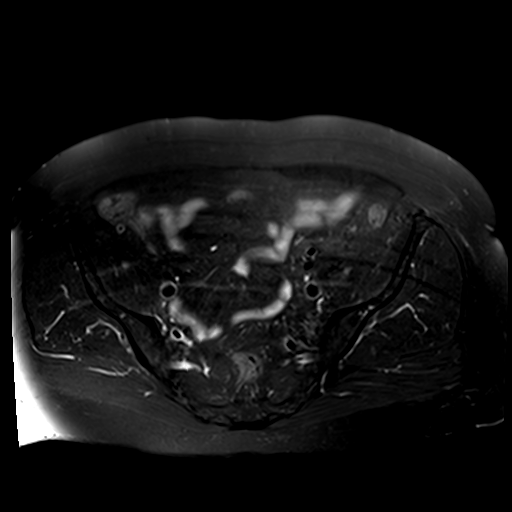

[Series 6: PD fat-sat · sagittal · right · 4.0mm · 0.70mm/px · 8 of 33 slices shown (1 of 2)]
[im 1/33]
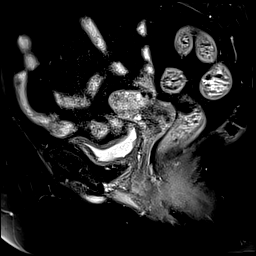
[im 5/33]
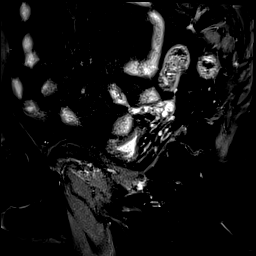
[im 10/33]
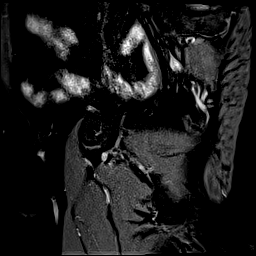
[im 14/33]
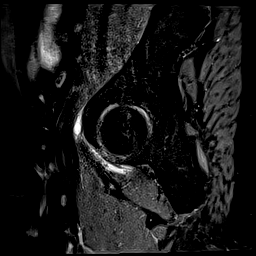
[im 19/33]
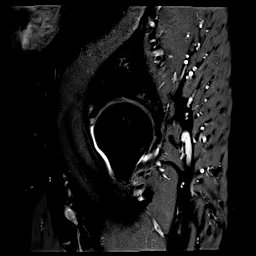
[im 23/33]
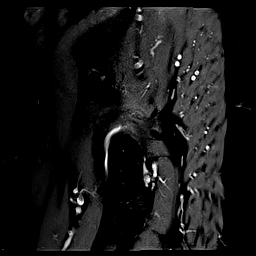
[im 28/33]
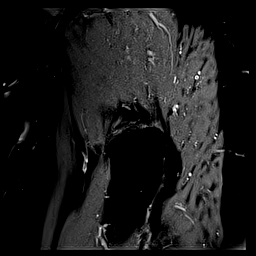
[im 33/33]
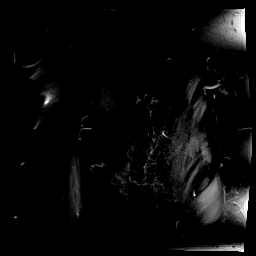

[Series 7: PD fat-sat · coronal · right · 4.0mm · 0.70mm/px · 7 of 29 slices shown (2 of 2)]
[im 1/29]
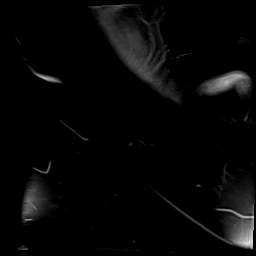
[im 5/29]
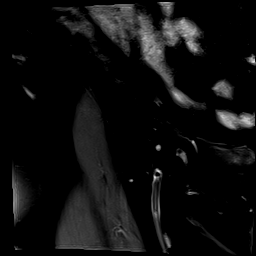
[im 10/29]
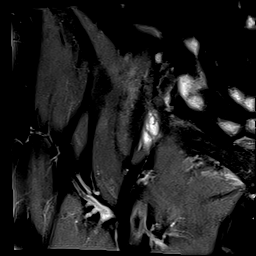
[im 15/29]
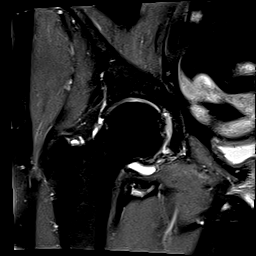
[im 19/29]
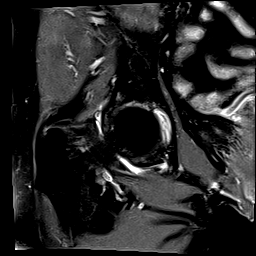
[im 24/29]
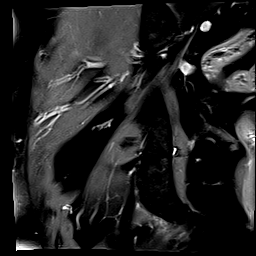
[im 29/29]
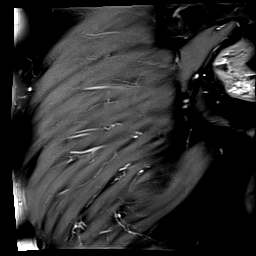

[27 of 40 positions shown; findings below may reference images not displayed]

FINDINGS: Bones: No acute fracture. No dislocation. No femoral head avascular
necrosis. Bony pelvis intact. Minimal arthropathy of the SI joints
and pubic symphysis. No bone marrow edema. No marrow replacing bone
lesion.

Articular cartilage and labrum

Articular cartilage: Moderate chondral loss of the right hip joint,
most pronounced superiorly. Mild subchondral marrow signal changes
within the acetabulum.

Labrum:  Superior labral degeneration and suspected tearing.

Joint or bursal effusion

Joint effusion:  Small right hip joint effusion.

Bursae: No abnormal bursal fluid collection.

Muscles and tendons

Muscles and tendons: The gluteal, hamstring, iliopsoas, rectus
femoris, and adductor tendons appear intact without tear or
significant tendinosis. Mild intramuscular edema within the proximal
right adductor musculature. Otherwise normal muscle bulk and signal
intensity without edema, atrophy, or fatty infiltration.

Other findings

Miscellaneous: No soft tissue edema or fluid collection. No inguinal
lymphadenopathy. No acute findings are seen within the pelvis.
IMPRESSION: 1. Moderate right hip osteoarthritis.
2. Small right hip joint effusion, likely reactive.
3. Mild intramuscular edema within the proximal right adductor
musculature, which may reflect muscle strain.

## 2020-06-30 ENCOUNTER — Other Ambulatory Visit (INDEPENDENT_AMBULATORY_CARE_PROVIDER_SITE_OTHER): Payer: Medicare PPO

## 2020-06-30 ENCOUNTER — Encounter: Payer: Self-pay | Admitting: Internal Medicine

## 2020-06-30 DIAGNOSIS — N179 Acute kidney failure, unspecified: Secondary | ICD-10-CM | POA: Diagnosis not present

## 2020-06-30 LAB — BASIC METABOLIC PANEL
BUN: 17 mg/dL (ref 6–23)
CO2: 29 mEq/L (ref 19–32)
Calcium: 9.4 mg/dL (ref 8.4–10.5)
Chloride: 101 mEq/L (ref 96–112)
Creatinine, Ser: 0.91 mg/dL (ref 0.40–1.20)
GFR: 63.26 mL/min (ref >60.00)
Glucose, Bld: 102 mg/dL — ABNORMAL HIGH (ref 70–99)
Potassium: 4.1 mEq/L (ref 3.5–5.1)
Sodium: 138 mEq/L (ref 135–145)

## 2020-07-01 NOTE — Telephone Encounter (Signed)
Please advise 

## 2020-07-06 DIAGNOSIS — M47812 Spondylosis without myelopathy or radiculopathy, cervical region: Secondary | ICD-10-CM | POA: Diagnosis not present

## 2020-07-06 DIAGNOSIS — M62830 Muscle spasm of back: Secondary | ICD-10-CM | POA: Diagnosis not present

## 2020-07-06 DIAGNOSIS — M5030 Other cervical disc degeneration, unspecified cervical region: Secondary | ICD-10-CM | POA: Diagnosis not present

## 2020-07-06 DIAGNOSIS — M1611 Unilateral primary osteoarthritis, right hip: Secondary | ICD-10-CM | POA: Diagnosis not present

## 2020-07-06 DIAGNOSIS — M62838 Other muscle spasm: Secondary | ICD-10-CM | POA: Diagnosis not present

## 2020-07-06 DIAGNOSIS — M5416 Radiculopathy, lumbar region: Secondary | ICD-10-CM | POA: Diagnosis not present

## 2020-07-06 DIAGNOSIS — M5136 Other intervertebral disc degeneration, lumbar region: Secondary | ICD-10-CM | POA: Diagnosis not present

## 2020-07-21 ENCOUNTER — Other Ambulatory Visit: Payer: Self-pay

## 2020-07-21 ENCOUNTER — Encounter: Payer: Self-pay | Admitting: Podiatry

## 2020-07-21 ENCOUNTER — Ambulatory Visit: Payer: Medicare PPO | Admitting: Podiatry

## 2020-07-21 DIAGNOSIS — M79675 Pain in left toe(s): Secondary | ICD-10-CM | POA: Diagnosis not present

## 2020-07-21 DIAGNOSIS — M79674 Pain in right toe(s): Secondary | ICD-10-CM

## 2020-07-21 DIAGNOSIS — B351 Tinea unguium: Secondary | ICD-10-CM

## 2020-07-21 NOTE — Progress Notes (Signed)
  Subjective:  Patient ID: Tabitha Lewis, female    DOB: 05/06/1950,  MRN: [id number]  Chief Complaint  Patient presents with   Nail Problem    Dry skin and nail fungus    70 y.o. female returns for the above complaint.  Patient presents with thickened elongated dystrophic toenails x10.  Painful to touch.  Patient would like for me to debride down.  She has not seen anyone else prior to seeing me.  She denies any other acute complaints.  Objective:  There were no vitals filed for this visit. Podiatric Exam: Vascular: dorsalis pedis and posterior tibial pulses are palpable bilateral. Capillary return is immediate. Temperature gradient is WNL. Skin turgor WNL  Sensorium: Normal Semmes Weinstein monofilament test. Normal tactile sensation bilaterally. Nail Exam: Pt has thick disfigured discolored nails with subungual debris noted bilateral entire nail hallux through fifth toenails.  Pain on palpation to the nails. Ulcer Exam: There is no evidence of ulcer or pre-ulcerative changes or infection. Orthopedic Exam: Muscle tone and strength are WNL. No limitations in general ROM. No crepitus or effusions noted. HAV  B/L.  Hammer toes 2-5  B/L. Skin: No Porokeratosis. No infection or ulcers    Assessment & Plan:   1. Pain due to onychomycosis of toenails of both feet     Patient was evaluated and treated and all questions answered.  Onychomycosis with pain  -Nails palliatively debrided as below. -Educated on self-care  Procedure: Nail Debridement Rationale: pain  Type of Debridement: manual, sharp debridement. Instrumentation: Nail nipper, rotary burr. Number of Nails: 10  Procedures and Treatment: Consent by patient was obtained for treatment procedures. The patient understood the discussion of treatment and procedures well. All questions were answered thoroughly reviewed. Debridement of mycotic and hypertrophic toenails, 1 through 5 bilateral and clearing of subungual debris. No  ulceration, no infection noted.  Return Visit-Office Procedure: Patient instructed to return to the office for a follow up visit 3 months for continued evaluation and treatment.  Nicholes Rough, DPM    Return in about 3 months (around 10/21/2020) for [facility].

## 2020-07-27 DIAGNOSIS — I152 Hypertension secondary to endocrine disorders: Secondary | ICD-10-CM | POA: Diagnosis not present

## 2020-07-27 DIAGNOSIS — E1159 Type 2 diabetes mellitus with other circulatory complications: Secondary | ICD-10-CM | POA: Diagnosis not present

## 2020-07-27 DIAGNOSIS — M1611 Unilateral primary osteoarthritis, right hip: Secondary | ICD-10-CM | POA: Diagnosis not present

## 2020-08-05 ENCOUNTER — Encounter: Payer: Self-pay | Admitting: Internal Medicine

## 2020-08-06 NOTE — Telephone Encounter (Signed)
Please advise 

## 2020-08-12 ENCOUNTER — Other Ambulatory Visit: Payer: Self-pay | Admitting: Orthopedic Surgery

## 2020-08-12 DIAGNOSIS — S86912A Strain of unspecified muscle(s) and tendon(s) at lower leg level, left leg, initial encounter: Secondary | ICD-10-CM | POA: Diagnosis not present

## 2020-08-12 DIAGNOSIS — M1712 Unilateral primary osteoarthritis, left knee: Secondary | ICD-10-CM | POA: Diagnosis not present

## 2020-08-21 ENCOUNTER — Ambulatory Visit: Payer: Medicare PPO | Admitting: Internal Medicine

## 2020-08-21 ENCOUNTER — Encounter: Payer: Self-pay | Admitting: Internal Medicine

## 2020-08-21 ENCOUNTER — Other Ambulatory Visit: Payer: Self-pay

## 2020-08-21 VITALS — BP 102/70 | HR 90 | Temp 98.4°F | Ht 64.02 in | Wt 172.6 lb

## 2020-08-21 DIAGNOSIS — M1712 Unilateral primary osteoarthritis, left knee: Secondary | ICD-10-CM | POA: Diagnosis not present

## 2020-08-21 DIAGNOSIS — M1612 Unilateral primary osteoarthritis, left hip: Secondary | ICD-10-CM | POA: Diagnosis not present

## 2020-08-21 DIAGNOSIS — E119 Type 2 diabetes mellitus without complications: Secondary | ICD-10-CM

## 2020-08-21 DIAGNOSIS — M160 Bilateral primary osteoarthritis of hip: Secondary | ICD-10-CM | POA: Diagnosis not present

## 2020-08-21 DIAGNOSIS — R7303 Prediabetes: Secondary | ICD-10-CM | POA: Diagnosis not present

## 2020-08-21 DIAGNOSIS — S76312A Strain of muscle, fascia and tendon of the posterior muscle group at thigh level, left thigh, initial encounter: Secondary | ICD-10-CM | POA: Insufficient documentation

## 2020-08-21 DIAGNOSIS — S76312D Strain of muscle, fascia and tendon of the posterior muscle group at thigh level, left thigh, subsequent encounter: Secondary | ICD-10-CM

## 2020-08-21 DIAGNOSIS — R350 Frequency of micturition: Secondary | ICD-10-CM

## 2020-08-21 DIAGNOSIS — M1611 Unilateral primary osteoarthritis, right hip: Secondary | ICD-10-CM

## 2020-08-21 MED ORDER — TRAMADOL HCL 50 MG PO TABS: 50.0000 mg | ORAL_TABLET | Freq: Two times a day (BID) | ORAL | 2 refills | Status: DC | PRN

## 2020-08-21 NOTE — Progress Notes (Signed)
Chief Complaint  Patient presents with   Follow-up    Pt wants to discuss upcoming surgeries. Hip Surgery/Labral Tear on August 30,2022.  Torn Hamstring and possible knee replacement   Urinary Frequency    Pt c/o possible UTI due to urinary frequency.    F/u with daughter today walking with rollator with seat  1. C/o increased urine freq with h/o UTI   2. H/o falls and pending left total hip and knee and right labral tear 1st surgery sch right labral/hamstring tear 09/15/20 Dr. Rudene Christians TOTAL HIP ARTHROPLASTY ANTERIOR APPROACH will be doing this surgery. 08/12/20 she injured this area and has visit 08/24/20 with Dr. Rudene Christians pre op before surgery and I have no concerns with surgical risk today  pain 4-6/10 sitting and with movement worse  PT in the past helped neck and low back pain. Had 3 injections left hip in the past Dr. Sharlet Salina and f/u with Dr. Roland Rack and this did not help  MRI abnormal  She requests medication for pain cant take nsaids due to AKI in the past with Nsaids. Had tramadol in the past   Pt signed pain contract today   11/12/19 Xray low back IMPRESSION: 1. Scoliosis. 2. Multilevel facet disease but no pars defects. 3. Mild bilateral lateral recess stenosis at L4-5. 4. No significant disc protrusions, spinal or foraminal stenosis.     Electronically Signed   By: Marijo Sanes M.D.   On: 11/13/2019 12:02  06/29/20 right hip MRI    Miscellaneous: No soft tissue edema or fluid collection. No inguinal lymphadenopathy. No acute findings are seen within the pelvis.   IMPRESSION: 1. Moderate right hip osteoarthritis. 2. Small right hip joint effusion, likely reactive. 3. Mild intramuscular edema within the proximal right adductor musculature, which may reflect muscle strain.   3. Htn controlled on lis 56-43.3 mg qd and bystolic 2.5 mg qd last I9J 6.5 reduced down to 6.1    Review of Systems  Constitutional:  Negative for weight loss.  HENT:  Negative for hearing loss.    Eyes:  Negative for blurred vision.  Respiratory:  Negative for shortness of breath.   Cardiovascular:  Negative for chest pain.  Gastrointestinal:  Negative for abdominal pain.  Musculoskeletal:  Positive for joint pain. Negative for back pain and neck pain.       Falls x 1    Skin:  Negative for rash.  Neurological:  Negative for dizziness and headaches.  Psychiatric/Behavioral:  Negative for depression.   Past Medical History:  Diagnosis Date   AKI (acute kidney injury) ([place])    03/2020-04/2020   Allergy    i.e contact allergies [facility]; also plants, trees    Chicken pox    Depression    Family history of breast cancer    BRCA negative in the past   Family history of breast cancer    Family history of lung cancer    Family history of rectal cancer    History of prediabetes    Hypertension    Interstitial cystitis    Interstitial cystitis    Prediabetes    A1C 6.3 06/14/16   Spinal stenosis    UTI (urinary tract infection)    UTI (urinary tract infection)    Past Surgical History:  Procedure Laterality Date   CATARACT EXTRACTION     b/l Dr. Tommy Rainwater    Family History  Problem Relation Age of Onset   Breast cancer Mother 48  s/p b/l masectomy    Breast cancer Sister 44   Breast cancer Maternal Aunt    Breast cancer Maternal Grandmother    Stroke Father        age 51    Stroke Other        grandmother age 76    Lupus Other    Other Daughter        benign brain tumor   Polycystic ovary syndrome Daughter    Social History   Socioeconomic History   Marital status: Widowed    Spouse name: Not on file   Number of children: Not on file   Years of education: Not on file   Highest education level: Not on file  Occupational History   Not on file  Tobacco Use   Smoking status: Former   Smokeless tobacco: Never   Tobacco comments:    smoked 4-5 years in college then quit  Substance and Sexual Activity   Alcohol use: No   Drug use: No    Sexual activity: Not on file  Other Topics Concern   Not on file  Social History Narrative   1 daughter and 1 son    3 grandsons    Married to high school sweet heart (husband) x 74 years dx'ed with prostate cancer in 04-08-2015 he died 07-Jun-2017    Retired Event organiser education [employer]   Social Determinants of Health   Financial Resource Strain: Low Risk    Difficulty of Paying Living Expenses: Not hard at all  Food Insecurity: No Food Insecurity   Worried About Charity fundraiser in the Last Year: Never true   Arboriculturist in the Last Year: Never true  Transportation Needs: No Transportation Needs   Lack of Transportation (Medical): No   Lack of Transportation (Non-Medical): No  Physical Activity: Insufficiently Active   Days of Exercise per Week: 1 day   Minutes of Exercise per Session: 60 min  Stress: No Stress Concern Present   Feeling of Stress : Not at all  Social Connections: Unknown   Frequency of Communication with Friends and Family: More than three times a week   Frequency of Social Gatherings with Friends and Family: More than three times a week   Attends Religious Services: Not on Electrical engineer or Organizations: Yes   Attends Archivist Meetings: 1 to 4 times per year   Marital Status: Married  Human resources officer Violence: Not At Risk   Fear of Current or Ex-Partner: No   Emotionally Abused: No   Physically Abused: No   Sexually Abused: No   Current Meds  Medication Sig   Azelastine-Fluticasone 137-50 MCG/ACT SUSP 1 spray bid   cetirizine (ZYRTEC) 10 MG tablet Take by mouth.   Cholecalciferol (VITAMIN D3 PO) Take 5,000 Units by mouth.   lisinopril-hydrochlorothiazide (ZESTORETIC) 20-12.5 MG tablet Take 1 tablet by mouth daily. In am if BP too low <90<60 cut pill in 1/2   Multiple Vitamin (MULTIVITAMIN) capsule Take 1 capsule by mouth daily.   nebivolol (BYSTOLIC) 2.5 MG tablet Take 1 tablet (2.5 mg total) by mouth daily.    traMADol (ULTRAM) 50 MG tablet Take 1 tablet (50 mg total) by mouth every 12 (twelve) hours as needed.   traZODone (DESYREL) 100 MG tablet Take 1 tablet by mouth at bedtime as needed.   Allergies  Allergen Reactions   Amlodipine Other (See Comments)    Leg swelling  max dose   Leg swelling max dose    Nsaids     AKI    Benzalkonium Chloride Rash   Cetyl Alcohol Rash   Codeine Rash   Lanolin Rash   Neomycin Rash   Nickel Rash   Propylene Glycol Rash   Recent Results (from the past 2160 hour(s))  Basic Metabolic Panel (BMET)     Status: Abnormal   Collection Time: 06/30/20  9:52 AM  Result Value Ref Range   Sodium 138 135 - 145 mEq/L   Potassium 4.1 3.5 - 5.1 mEq/L   Chloride 101 96 - 112 mEq/L   CO2 29 19 - 32 mEq/L   Glucose, Bld 102 (H) 70 - 99 mg/dL   BUN 17 6 - 23 mg/dL   Creatinine, Ser 0.91 0.40 - 1.20 mg/dL   GFR 63.26 >60.00 mL/min    Comment: Calculated using the CKD-EPI Creatinine Equation (2021)   Calcium 9.4 8.4 - 10.5 mg/dL  Urinalysis, Routine w reflex microscopic     Status: Abnormal   Collection Time: 08/21/20  3:27 PM  Result Value Ref Range   Color, Urine YELLOW YELLOW   APPearance CLEAR CLEAR   Specific Gravity, Urine 1.019 1.001 - 1.035   pH < OR = 5.0 5.0 - 8.0   Glucose, UA NEGATIVE NEGATIVE   Bilirubin Urine NEGATIVE NEGATIVE   Ketones, ur NEGATIVE NEGATIVE   Hgb urine dipstick NEGATIVE NEGATIVE   Protein, ur NEGATIVE NEGATIVE   Nitrite NEGATIVE NEGATIVE   Leukocytes,Ua TRACE (A) NEGATIVE   WBC, UA 6-10 (A) 0 - 5 /HPF   RBC / HPF NONE SEEN 0 - 2 /HPF   Squamous Epithelial / LPF NONE SEEN < OR = 5 /HPF   Bacteria, UA NONE SEEN NONE SEEN /HPF   Hyaline Cast NONE SEEN NONE SEEN /LPF  Urine Culture     Status: None   Collection Time: 08/21/20  3:27 PM   Specimen: Urine  Result Value Ref Range   MICRO NUMBER: [id number]    SPECIMEN QUALITY: Adequate    Sample Source NOT GIVEN    STATUS: FINAL    Result: No Growth   MICROSCOPIC MESSAGE      Status: None   Collection Time: 08/21/20  3:27 PM  Result Value Ref Range   Note      Comment: This urine was analyzed for the presence of WBC,  RBC, bacteria, casts, and other formed elements.  Only those elements seen were reported. . .   POCT glycosylated hemoglobin (Hb A1C)     Status: Abnormal   Collection Time: 08/24/20  8:27 AM  Result Value Ref Range   Hemoglobin A1C 6.1 (A) 4.0 - 5.6 %   HbA1c POC (<> result, manual entry)     HbA1c, POC (prediabetic range)     HbA1c, POC (controlled diabetic range)     Objective  Body mass index is 29.61 kg/m. Wt Readings from Last 3 Encounters:  08/21/20 172 lb 9.6 oz (78.3 kg)  06/12/20 184 lb 9.6 oz (83.7 kg)  03/25/20 184 lb 9.6 oz (83.7 kg)   Temp Readings from Last 3 Encounters:  08/21/20 98.4 F (36.9 C)  06/12/20 97.9 F (36.6 C) (Oral)  03/25/20 98.2 F (36.8 C) (Oral)   BP Readings from Last 3 Encounters:  08/21/20 102/70  06/12/20 138/76  03/25/20 130/72   Pulse Readings from Last 3 Encounters:  08/21/20 90  06/12/20 83  03/25/20 92    Physical Exam Vitals  and nursing note reviewed.  Constitutional:      Appearance: Normal appearance. She is well-developed and well-groomed. She is obese.  HENT:     Head: Normocephalic and atraumatic.  Eyes:     Conjunctiva/sclera: Conjunctivae normal.     Pupils: Pupils are equal, round, and reactive to light.  Cardiovascular:     Rate and Rhythm: Normal rate and regular rhythm.     Heart sounds: Normal heart sounds. No murmur heard. Pulmonary:     Effort: Pulmonary effort is normal.     Breath sounds: Normal breath sounds.  Abdominal:     General: Abdomen is flat. Bowel sounds are normal.  Skin:    General: Skin is warm and moist.  Neurological:     General: No focal deficit present.     Mental Status: She is alert and oriented to person, place, and time. Mental status is at baseline.     Gait: Gait normal.     Comments: Rollator   Psychiatric:         Attention and Perception: Attention and perception normal.        Mood and Affect: Mood and affect normal.        Speech: Speech normal.        Behavior: Behavior normal. Behavior is cooperative.        Thought Content: Thought content normal.        Cognition and Memory: Cognition and memory normal.        Judgment: Judgment normal.    Assessment  Plan  Arthritis of right hip  1st surgery sch right labral/hamstring tear 09/15/20 Dr. Rudene Christians TOTAL HIP ARTHROPLASTY ANTERIOR APPROACH will be doing this surgery   Revised cardiac index score 0 cleared for surgery 09/15/20 low risk  Arthritis of left knee Arthritis of left hip Tear of left hamstring, subsequent encounter  Will ultimately need left total hip and left total knee   HM Flu utd will need upcoming Tdap had 07/22/15 pna 23 11/20/14 prevnar utd Consider shingrix in future will Rx to pharmacy when pt ready disc prev 4/4 covid 19 vaccines Hep B immune, MMR immune D3 5000 IU daily    Last pap 07/22/15 negative HPV neg out of age window    Mammogram 09/10/19 neg 07/31/18 genetics test + sister and 1/2 tested +nieces   -ordered sch 09/10/20    Ordered dexa   cologaurd neg 12/30/16  and 02/12/20 neg    F/u [facility] had bx 07/2017 neg f/u in 1 year saw in 07/2018 and f/u in 1 year saw 07/2019 Hassan Rowan normal no bxs/lng    rec healthy diet and exercise  Provider: Dr. Olivia Mackie McLean-Scocuzza-Internal Medicine

## 2020-08-22 LAB — URINALYSIS, ROUTINE W REFLEX MICROSCOPIC
Bacteria, UA: NONE SEEN /HPF
Bilirubin Urine: NEGATIVE
Glucose, UA: NEGATIVE
Hgb urine dipstick: NEGATIVE
Hyaline Cast: NONE SEEN /LPF
Ketones, ur: NEGATIVE
Nitrite: NEGATIVE
Protein, ur: NEGATIVE
RBC / HPF: NONE SEEN /HPF (ref 0–2)
Specific Gravity, Urine: 1.019 (ref 1.001–1.035)
Squamous Epithelial / HPF: NONE SEEN /HPF (ref <=5)
pH: <=5 (ref 5.0–8.0)

## 2020-08-22 LAB — URINE CULTURE
MICRO NUMBER:: 12207499
Result:: NO GROWTH
SPECIMEN QUALITY:: ADEQUATE

## 2020-08-22 LAB — MICROSCOPIC MESSAGE

## 2020-08-24 ENCOUNTER — Other Ambulatory Visit: Payer: Self-pay | Admitting: Internal Medicine

## 2020-08-24 ENCOUNTER — Encounter: Payer: Self-pay | Admitting: Internal Medicine

## 2020-08-24 DIAGNOSIS — E119 Type 2 diabetes mellitus without complications: Secondary | ICD-10-CM | POA: Diagnosis not present

## 2020-08-24 DIAGNOSIS — R7303 Prediabetes: Secondary | ICD-10-CM | POA: Insufficient documentation

## 2020-08-24 DIAGNOSIS — G4700 Insomnia, unspecified: Secondary | ICD-10-CM

## 2020-08-24 DIAGNOSIS — M1712 Unilateral primary osteoarthritis, left knee: Secondary | ICD-10-CM | POA: Diagnosis not present

## 2020-08-24 DIAGNOSIS — M1611 Unilateral primary osteoarthritis, right hip: Secondary | ICD-10-CM | POA: Diagnosis not present

## 2020-08-24 LAB — POCT GLYCOSYLATED HEMOGLOBIN (HGB A1C): Hemoglobin A1C: 6.1 % — AB (ref 4.0–5.6)

## 2020-08-24 NOTE — Progress Notes (Signed)
A1C from 08/21/20 6.1=prediabetes  Rec healthy diet less breads, pastas, sweets, sweet drinks, rice and potatoes  If A1C goes down to 5.6 no prediabetes if up to 6.5=diabetes   Inform pt please

## 2020-08-25 NOTE — Telephone Encounter (Signed)
Please advise 

## 2020-09-07 ENCOUNTER — Other Ambulatory Visit: Payer: Self-pay

## 2020-09-07 ENCOUNTER — Encounter
Admission: RE | Admit: 2020-09-07 | Discharge: 2020-09-07 | Disposition: A | Discharge status: Home / Self Care | Payer: Medicare PPO | Source: Ambulatory Visit | Attending: Orthopedic Surgery | Admitting: Orthopedic Surgery

## 2020-09-07 ENCOUNTER — Other Ambulatory Visit: Payer: Self-pay | Admitting: Family Medicine

## 2020-09-07 DIAGNOSIS — Z01818 Encounter for other preprocedural examination: Secondary | ICD-10-CM | POA: Insufficient documentation

## 2020-09-07 HISTORY — DX: Type 2 diabetes mellitus without complications: E11.9

## 2020-09-07 LAB — CBC WITH DIFFERENTIAL/PLATELET
Abs Immature Granulocytes: 0.04 10*3/uL (ref 0.00–0.07)
Basophils Absolute: 0.1 10*3/uL (ref 0.0–0.1)
Basophils Relative: 1 %
Eosinophils Absolute: 0.1 10*3/uL (ref 0.0–0.5)
Eosinophils Relative: 1 %
HCT: 39 % (ref 36.0–46.0)
Hemoglobin: 13.1 g/dL (ref 12.0–15.0)
Immature Granulocytes: 0 %
Lymphocytes Relative: 17 %
Lymphs Abs: 1.6 10*3/uL (ref 0.7–4.0)
MCH: 29.6 pg (ref 26.0–34.0)
MCHC: 33.6 g/dL (ref 30.0–36.0)
MCV: 88 fL (ref 80.0–100.0)
Monocytes Absolute: 0.8 10*3/uL (ref 0.1–1.0)
Monocytes Relative: 9 %
Neutro Abs: 6.7 10*3/uL (ref 1.7–7.7)
Neutrophils Relative %: 72 %
Platelets: 326 10*3/uL (ref 150–400)
RBC: 4.43 MIL/uL (ref 3.87–5.11)
RDW: 12.2 % (ref 11.5–15.5)
WBC: 9.3 10*3/uL (ref 4.0–10.5)
nRBC: 0 % (ref 0.0–0.2)

## 2020-09-07 LAB — COMPREHENSIVE METABOLIC PANEL
ALT: 20 U/L (ref 0–44)
AST: 16 U/L (ref 15–41)
Albumin: 3.9 g/dL (ref 3.5–5.0)
Alkaline Phosphatase: 53 U/L (ref 38–126)
Anion gap: 7 (ref 5–15)
BUN: 26 mg/dL — ABNORMAL HIGH (ref 8–23)
CO2: 29 mmol/L (ref 22–32)
Calcium: 9.7 mg/dL (ref 8.9–10.3)
Chloride: 100 mmol/L (ref 98–111)
Creatinine, Ser: 1.03 mg/dL — ABNORMAL HIGH (ref 0.44–1.00)
GFR, Estimated: 58 mL/min — ABNORMAL LOW (ref >60)
Glucose, Bld: 119 mg/dL — ABNORMAL HIGH (ref 70–99)
Potassium: 3.4 mmol/L — ABNORMAL LOW (ref 3.5–5.1)
Sodium: 136 mmol/L (ref 135–145)
Total Bilirubin: 0.7 mg/dL (ref 0.3–1.2)
Total Protein: 7.2 g/dL (ref 6.5–8.1)

## 2020-09-07 LAB — TYPE AND SCREEN
ABO/RH(D): O POS
Antibody Screen: NEGATIVE

## 2020-09-07 LAB — URINALYSIS, ROUTINE W REFLEX MICROSCOPIC
Bilirubin Urine: NEGATIVE
Glucose, UA: NEGATIVE mg/dL
Hgb urine dipstick: NEGATIVE
Ketones, ur: NEGATIVE mg/dL
Nitrite: NEGATIVE
Protein, ur: NEGATIVE mg/dL
Specific Gravity, Urine: 1.025 (ref 1.005–1.030)
pH: 5 (ref 5.0–8.0)

## 2020-09-07 LAB — SURGICAL PCR SCREEN
MRSA, PCR: NEGATIVE
Staphylococcus aureus: NEGATIVE

## 2020-09-07 LAB — URINALYSIS, MICROSCOPIC (REFLEX)

## 2020-09-07 NOTE — Patient Instructions (Addendum)
Your procedure is scheduled on: September 15, 2020 TUESDAY Report to the Registration Desk on the 1st floor of the [facility]. To find out your arrival time, please call [phone] between 1PM - 3PM on: Monday September 14, 2020  REMEMBER: Instructions that are not followed completely may result in serious medical risk, up to and including death; or upon the discretion of your surgeon and anesthesiologist your surgery may need to be rescheduled.  Do not eat food after midnight the night before surgery.  No gum chewing, lozengers or hard candies.  You may however, drink CLEAR liquids up to 2 hours before you are scheduled to arrive for your surgery. Do not drink anything within 2 hours of your scheduled arrival time.  Clear liquids include: Type 1 and Type 2 diabetics should only drink water.  TAKE THESE MEDICATIONS THE MORNING OF SURGERY WITH A SIP OF WATER: NONE   One week prior to surgery: Stop Anti-inflammatories (NSAIDS) such as Advil, Aleve, Ibuprofen, Motrin, Naproxen, Naprosyn and Aspirin based products such as Excedrin, Goodys Powder, BC Powder. Stop ANY OVER THE COUNTER supplements until after surgery. You may however, continue to take Tylenol if needed for pain up until the day of surgery.  No Alcohol for 24 hours before or after surgery.  No Smoking including e-cigarettes for 24 hours prior to surgery.  No chewable tobacco products for at least 6 hours prior to surgery.  No nicotine patches on the day of surgery.  Do not use any "recreational" drugs for at least a week prior to your surgery.  Please be advised that the combination of cocaine and anesthesia may have negative outcomes, up to and including death. If you test positive for cocaine, your surgery will be cancelled.  On the morning of surgery brush your teeth with toothpaste and water, you may rinse your mouth with mouthwash if you wish. Do not swallow any toothpaste or mouthwash.  Do not wear jewelry,  make-up, hairpins, clips or nail polish.  Do not wear lotions, powders, or perfumes OR DEODORANT   Do not shave body from the neck down 48 hours prior to surgery just in case you cut yourself which could leave a site for infection.  Also, freshly shaved skin may become irritated if using the CHG soap.  Contact lenses, hearing aids and dentures may not be worn into surgery.  Do not bring valuables to the hospital. [facility] is not responsible for any missing/lost belongings or valuables.   Use CHG Soap  as directed on instruction sheet.  Notify your doctor if there is any change in your medical condition (cold, fever, infection).  Wear comfortable clothing (specific to your surgery type) to the hospital.  After surgery, you can help prevent lung complications by doing breathing exercises.  Take deep breaths and cough every 1-2 hours. Your doctor may order a device called an Incentive Spirometer to help you take deep breaths. When coughing or sneezing, hold a pillow firmly against your incision with both hands. This is called "splinting." Doing this helps protect your incision. It also decreases belly discomfort.  If you are being admitted to the hospital overnight, YOU MAY BRING A BAG WITH YOU.  If you are being discharged the day of surgery, you will not be allowed to drive home. You will need a responsible adult (18 years or older) to drive you home and stay with you that night.   If you are taking public transportation, you will need to have a  responsible adult (18 years or older) with you. Please confirm with your physician that it is acceptable to use public transportation.   Please call the Pre-admissions Testing Dept. at [phone] if you have any questions about these instructions.  Surgery Visitation Policy:  Patients undergoing a surgery or procedure may have one family member or support person with them as long as that person is not COVID-19 positive or experiencing  its symptoms.  That person may remain in the waiting area during the procedure.  Inpatient Visitation:    Visiting hours are 7 a.m. to 8 p.m. Inpatients will be allowed two visitors daily. The visitors may change each day during the patient's stay. No visitors under the age of 43. Any visitor under the age of 20 must be accompanied by an adult. The visitor must pass COVID-19 screenings, use hand sanitizer when entering and exiting the patient's room and wear a mask at all times, including in the patient's room. Patients must also wear a mask when staff or their visitor are in the room. Masking is required regardless of vaccination status.

## 2020-09-11 ENCOUNTER — Other Ambulatory Visit: Payer: Self-pay

## 2020-09-11 ENCOUNTER — Other Ambulatory Visit
Admission: RE | Admit: 2020-09-11 | Discharge: 2020-09-11 | Disposition: A | Discharge status: Home / Self Care | Payer: Medicare PPO | Source: Ambulatory Visit | Attending: Orthopedic Surgery | Admitting: Orthopedic Surgery

## 2020-09-11 DIAGNOSIS — Z20822 Contact with and (suspected) exposure to covid-19: Secondary | ICD-10-CM | POA: Diagnosis not present

## 2020-09-11 DIAGNOSIS — Z01812 Encounter for preprocedural laboratory examination: Secondary | ICD-10-CM | POA: Insufficient documentation

## 2020-09-11 LAB — SARS CORONAVIRUS 2 (TAT 6-24 HRS): SARS Coronavirus 2: NEGATIVE

## 2020-09-14 MED ORDER — FAMOTIDINE 20 MG PO TABS: 20.0000 mg | ORAL_TABLET | Freq: Once | ORAL | Status: AC

## 2020-09-14 MED ORDER — CEFAZOLIN SODIUM-DEXTROSE 2-4 GM/100ML-% IV SOLN
2.0000 g | INTRAVENOUS | Status: AC
  Administered 2020-09-15: 2 g via INTRAVENOUS

## 2020-09-14 MED ORDER — SODIUM CHLORIDE 0.9 % IV SOLN: INTRAVENOUS | Status: DC

## 2020-09-15 ENCOUNTER — Other Ambulatory Visit: Payer: Self-pay

## 2020-09-15 ENCOUNTER — Encounter: Payer: Self-pay | Admitting: Orthopedic Surgery

## 2020-09-15 ENCOUNTER — Observation Stay
Admission: RE | Admit: 2020-09-15 | Discharge: 2020-09-17 | Disposition: A | Discharge status: Home / Self Care | Payer: Medicare PPO | Attending: Orthopedic Surgery | Admitting: Orthopedic Surgery

## 2020-09-15 ENCOUNTER — Encounter
Admission: RE | Disposition: A | Discharge status: Home / Self Care | Payer: Self-pay | Source: Home / Self Care | Attending: Orthopedic Surgery

## 2020-09-15 ENCOUNTER — Ambulatory Visit: Payer: Medicare PPO | Admitting: Anesthesiology

## 2020-09-15 ENCOUNTER — Ambulatory Visit: Payer: Medicare PPO | Admitting: Urgent Care

## 2020-09-15 ENCOUNTER — Ambulatory Visit: Payer: Medicare PPO

## 2020-09-15 DIAGNOSIS — Z79899 Other long term (current) drug therapy: Secondary | ICD-10-CM | POA: Diagnosis not present

## 2020-09-15 DIAGNOSIS — M1712 Unilateral primary osteoarthritis, left knee: Secondary | ICD-10-CM | POA: Diagnosis not present

## 2020-09-15 DIAGNOSIS — I10 Essential (primary) hypertension: Secondary | ICD-10-CM | POA: Diagnosis not present

## 2020-09-15 DIAGNOSIS — Z471 Aftercare following joint replacement surgery: Secondary | ICD-10-CM | POA: Diagnosis not present

## 2020-09-15 DIAGNOSIS — G8918 Other acute postprocedural pain: Secondary | ICD-10-CM

## 2020-09-15 DIAGNOSIS — E119 Type 2 diabetes mellitus without complications: Secondary | ICD-10-CM | POA: Insufficient documentation

## 2020-09-15 DIAGNOSIS — M1611 Unilateral primary osteoarthritis, right hip: Principal | ICD-10-CM | POA: Insufficient documentation

## 2020-09-15 DIAGNOSIS — Z9889 Other specified postprocedural states: Secondary | ICD-10-CM | POA: Diagnosis not present

## 2020-09-15 DIAGNOSIS — Z96641 Presence of right artificial hip joint: Secondary | ICD-10-CM | POA: Diagnosis not present

## 2020-09-15 DIAGNOSIS — Z419 Encounter for procedure for purposes other than remedying health state, unspecified: Secondary | ICD-10-CM

## 2020-09-15 HISTORY — PX: TOTAL HIP ARTHROPLASTY: SHX124

## 2020-09-15 LAB — CBC
HCT: 31.5 % — ABNORMAL LOW (ref 36.0–46.0)
Hemoglobin: 10.5 g/dL — ABNORMAL LOW (ref 12.0–15.0)
MCH: 28.6 pg (ref 26.0–34.0)
MCHC: 33.3 g/dL (ref 30.0–36.0)
MCV: 85.8 fL (ref 80.0–100.0)
Platelets: 225 10*3/uL (ref 150–400)
RBC: 3.67 MIL/uL — ABNORMAL LOW (ref 3.87–5.11)
RDW: 12.1 % (ref 11.5–15.5)
WBC: 11.9 10*3/uL — ABNORMAL HIGH (ref 4.0–10.5)
nRBC: 0 % (ref 0.0–0.2)

## 2020-09-15 LAB — ABO/RH: ABO/RH(D): O POS

## 2020-09-15 LAB — GLUCOSE, CAPILLARY: Glucose-Capillary: 113 mg/dL — ABNORMAL HIGH (ref 70–99)

## 2020-09-15 LAB — CREATININE, SERUM
Creatinine, Ser: 0.99 mg/dL (ref 0.44–1.00)
GFR, Estimated: >60 mL/min (ref >60)

## 2020-09-15 IMAGING — DX DG HIP (WITH OR WITHOUT PELVIS) 2-3V*R*
2 series · 2 of 2 positions shown · non-contrast
Comparison: None.

CLINICAL DATA: Status post right hip replacement

EXAM:
DG HIP (WITH OR WITHOUT PELVIS) 2-3V RIGHT

[hip ap]
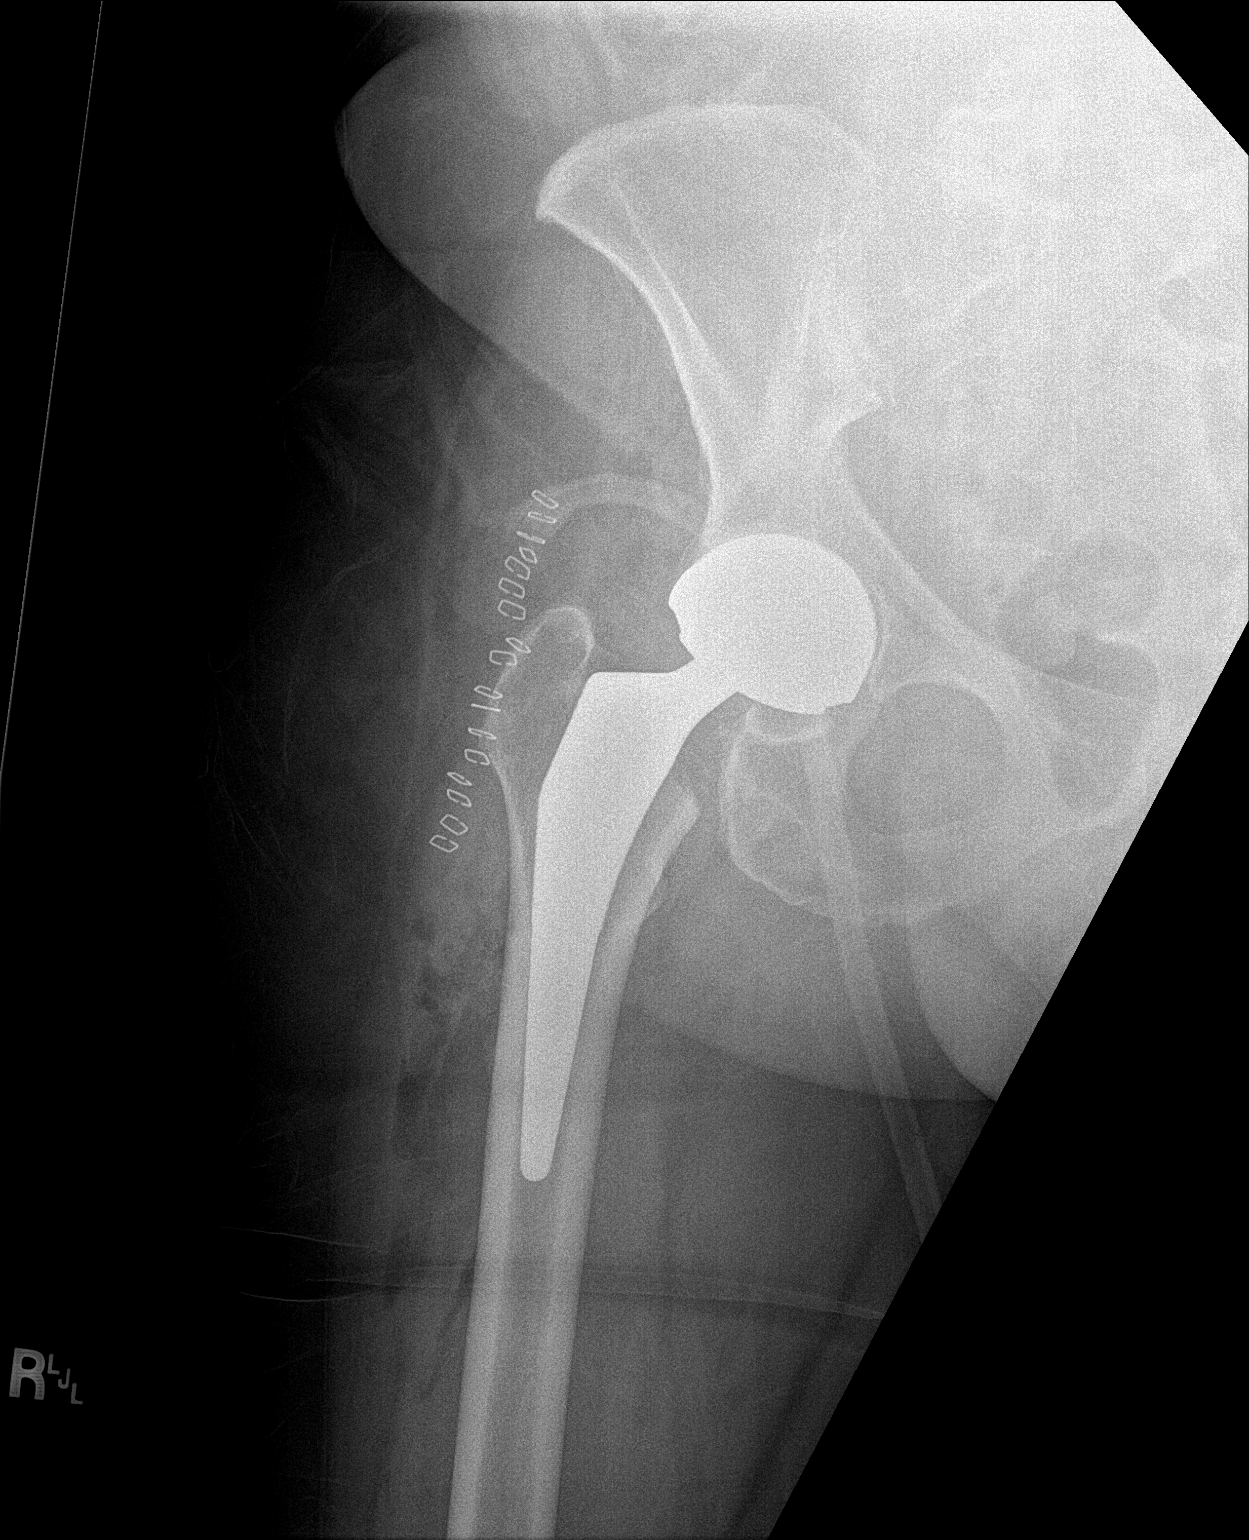

[hip lat]
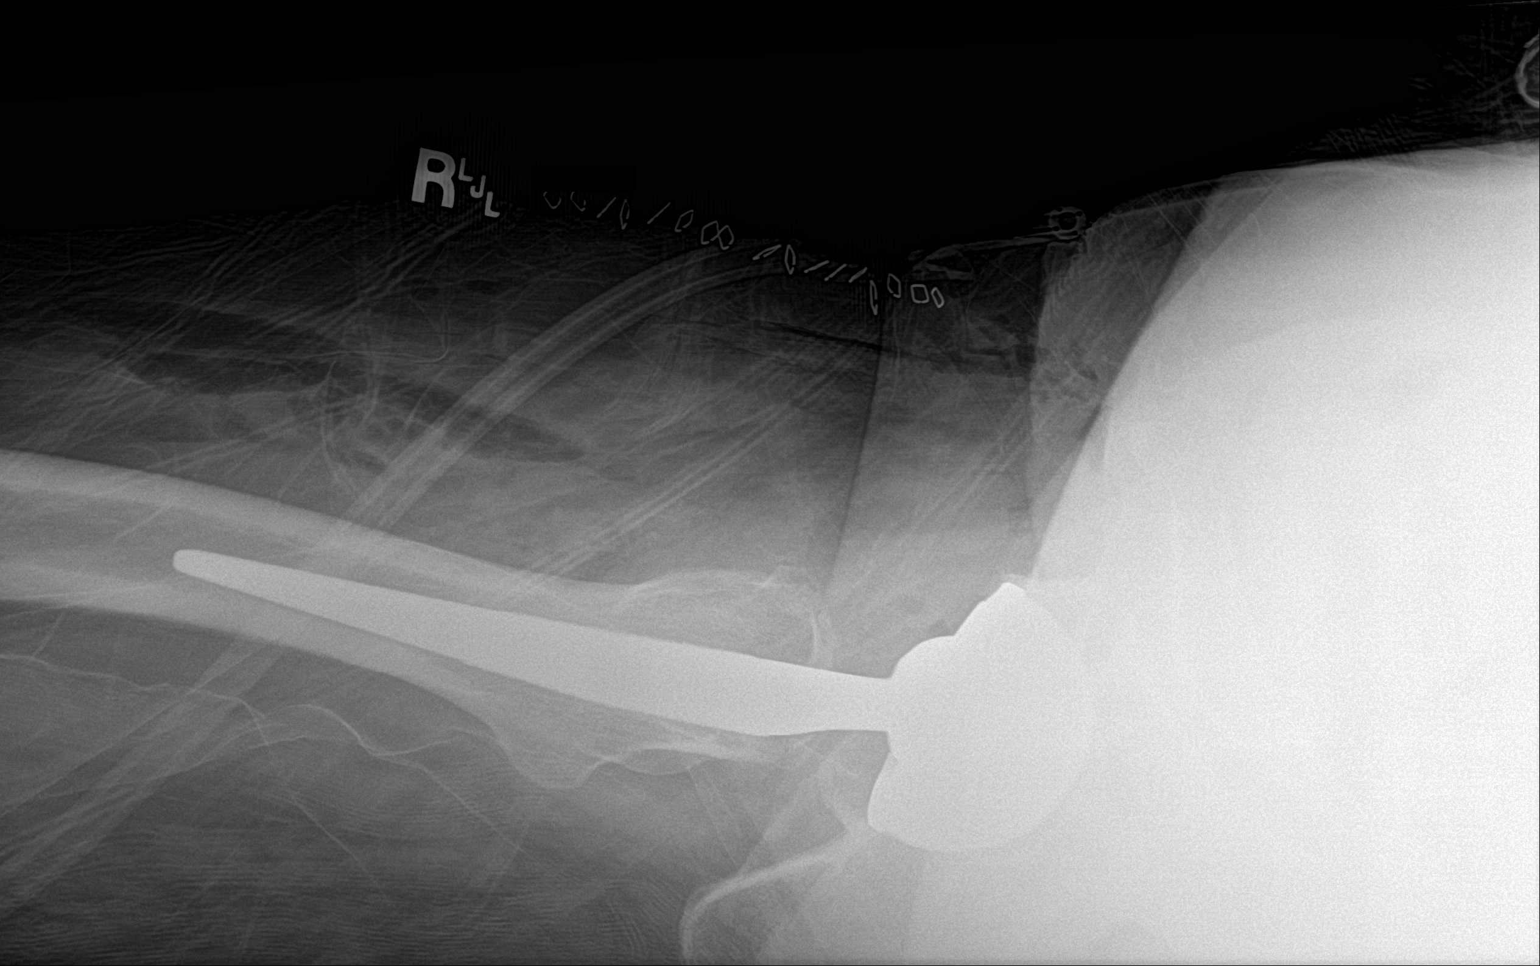

[2 of 2 positions shown; findings below may reference images not displayed]

FINDINGS: Right hip prosthesis is noted in satisfactory position. Postsurgical
changes are noted. No acute bony abnormality is seen.
IMPRESSION: Status post right hip replacement.

## 2020-09-15 IMAGING — XA DG HIP (WITH PELVIS) OPERATIVE*R*
4 of 5 series · 15 of 17 positions shown · non-contrast
Comparison: None.

CLINICAL DATA: Right hip replacement

EXAM:
OPERATIVE RIGHT HIP WITH PELVIS

[Series 1: ortho standard · 4 of 9 frames shown (1 of 4)]
[frame 2/9]
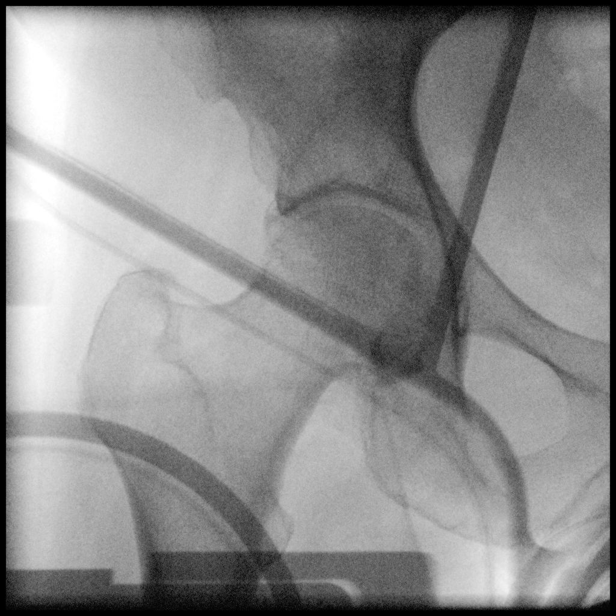
[frame 5/9]
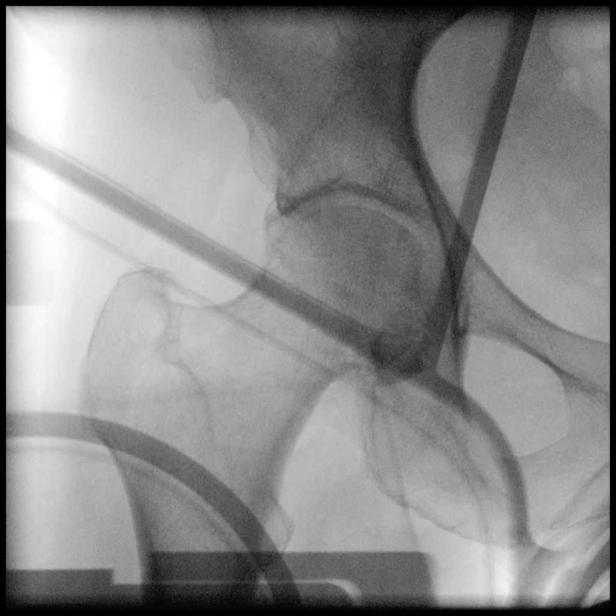
[frame 8/9]
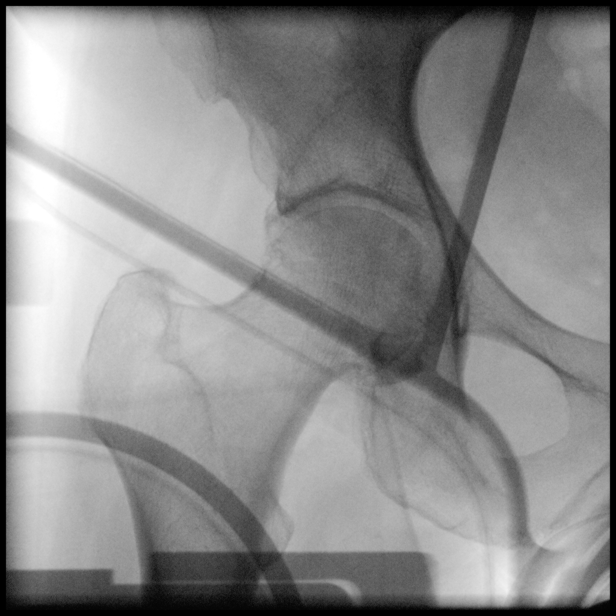
[frame 9/9]
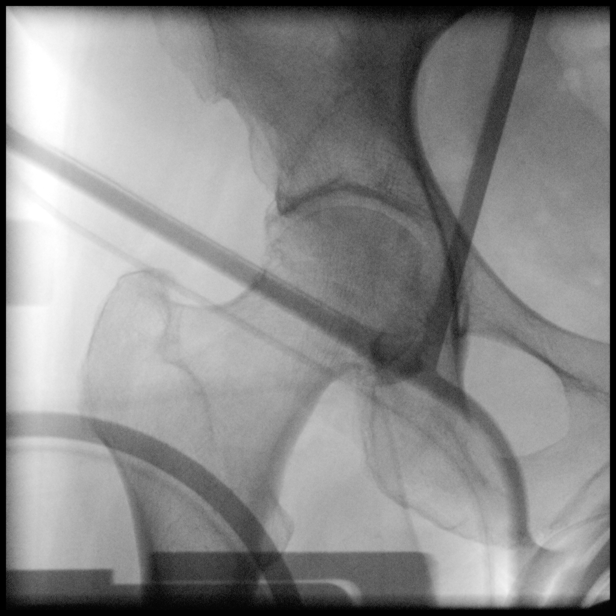

[Series 3: ortho standard · 4 of 9 frames shown (2 of 4)]
[frame 1/9]
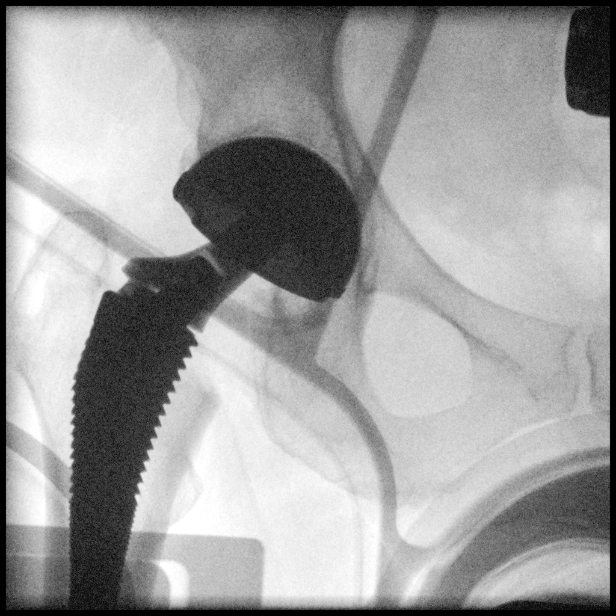
[frame 2/9]
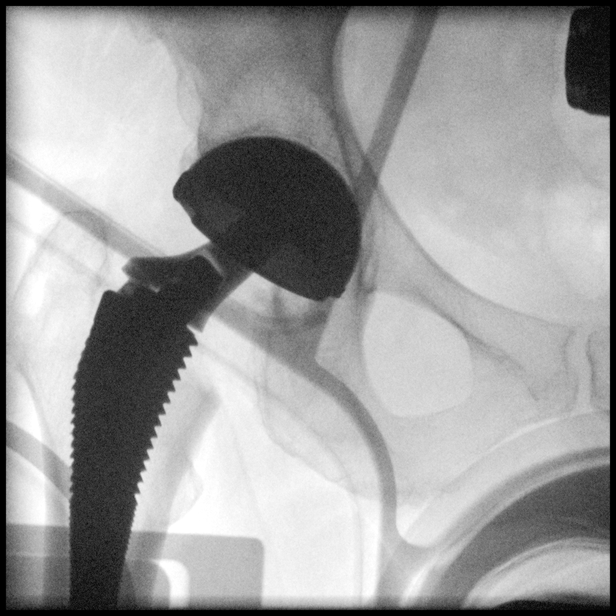
[frame 5/9]
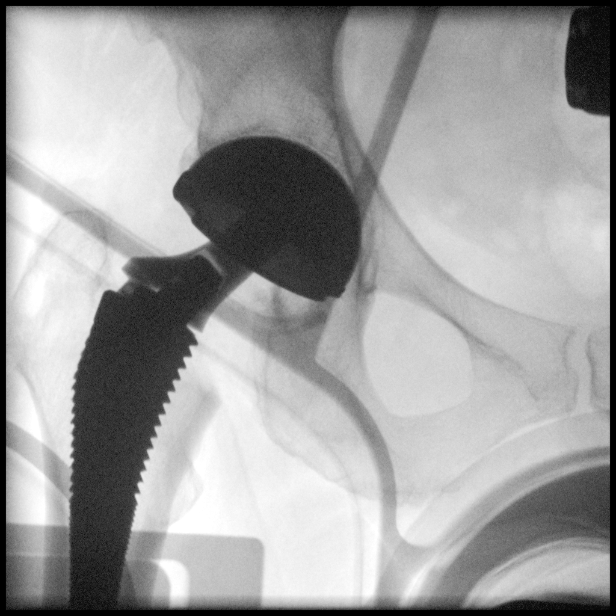
[frame 8/9]
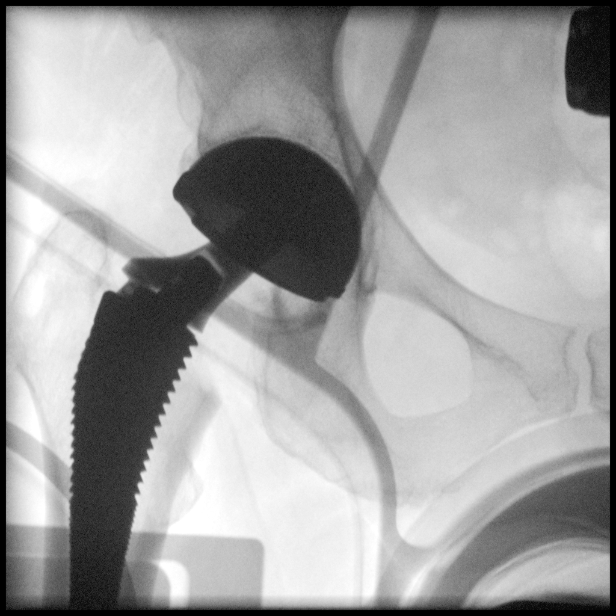

[Series 4: ortho standard · 3 of 9 frames shown (3 of 4)]
[frame 1/9]
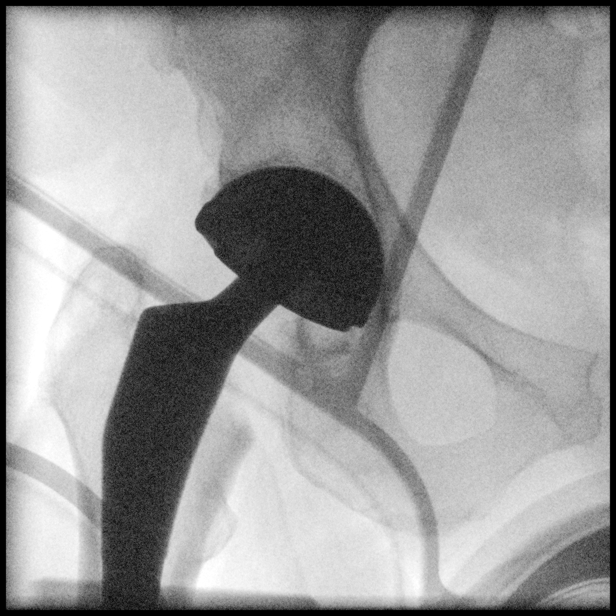
[frame 2/9]
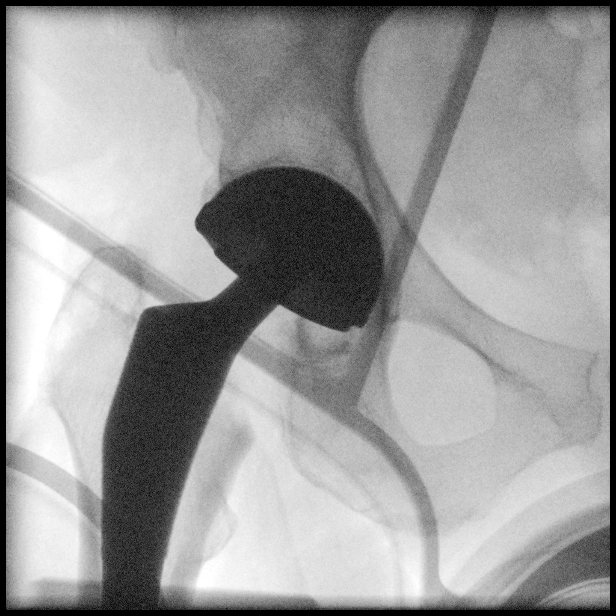
[frame 5/9]
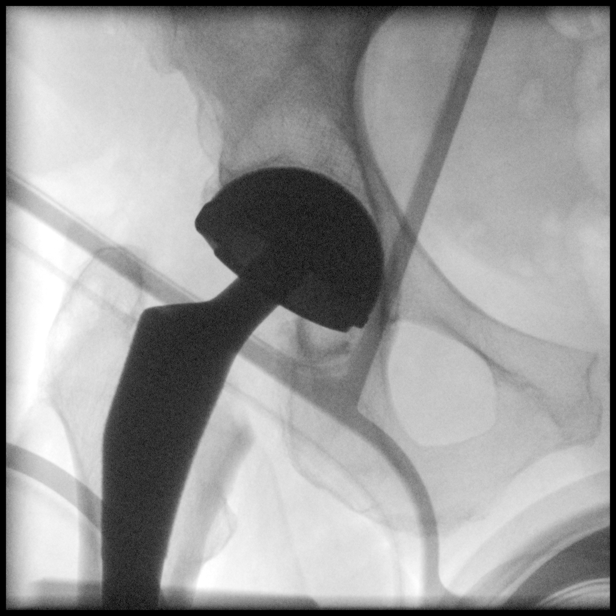

[Series 5: ortho standard · 4 of 9 frames shown (4 of 4)]
[frame 1/9]
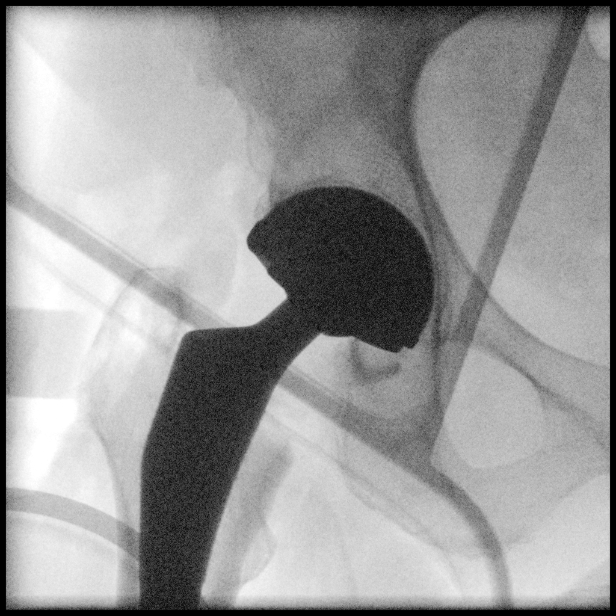
[frame 2/9]
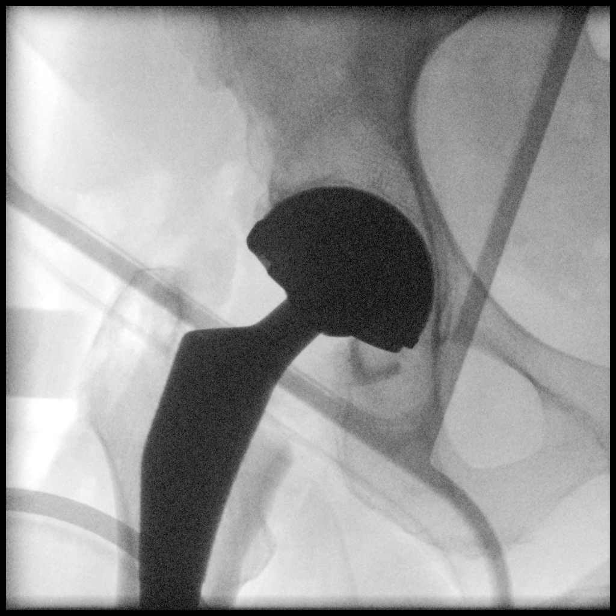
[frame 5/9]
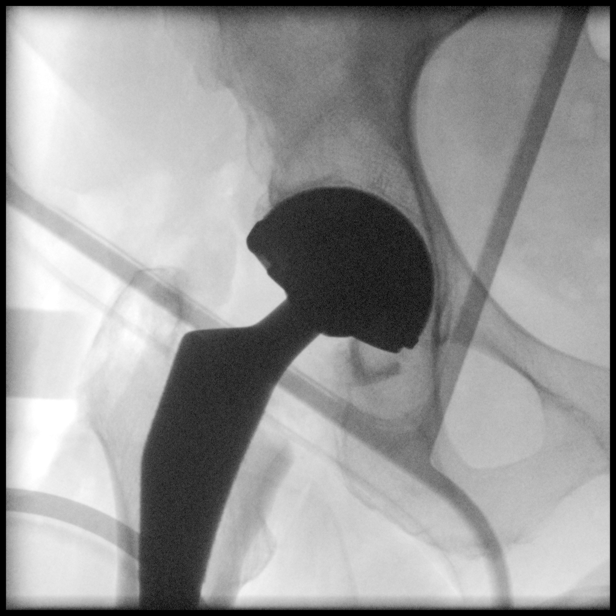
[frame 8/9]
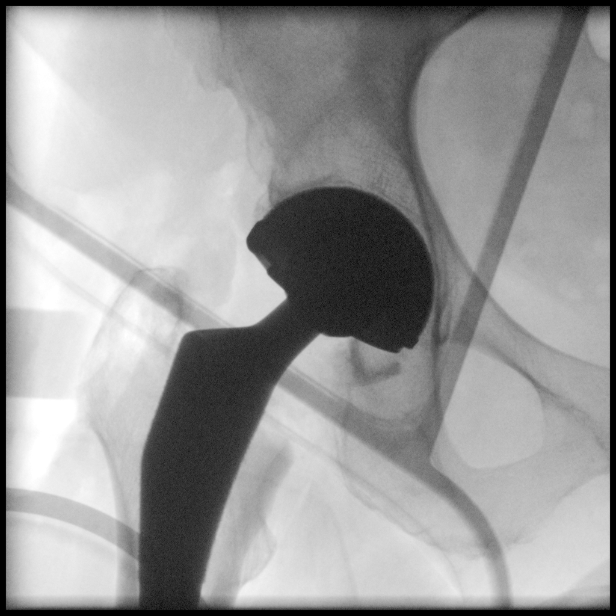

[15 of 17 positions shown; findings below may reference images not displayed]

FLUOROSCOPY TIME:  Radiation Exposure Index (as provided by the
fluoroscopic device): 4.4 mGy

If the device does not provide the exposure index:

Fluoroscopy Time:  12 seconds

Number of Acquired Images:  5
FINDINGS: Initial images demonstrate degenerative change of the right hip
joint. Subsequent acetabular and femoral filling was performed with
placement of prosthesis in satisfactory position.
IMPRESSION: Right hip replacement

## 2020-09-15 SURGERY — ARTHROPLASTY, HIP, TOTAL, ANTERIOR APPROACH
Anesthesia: Spinal | Site: Hip | Laterality: Right

## 2020-09-15 MED ORDER — AZELASTINE HCL 0.1 % NA SOLN
1.0000 | Freq: Two times a day (BID) | NASAL | Status: DC
  Filled 2020-09-15: qty 30

## 2020-09-15 MED ORDER — TIZANIDINE HCL 4 MG PO TABS
4.0000 mg | ORAL_TABLET | Freq: Four times a day (QID) | ORAL | Status: DC | PRN
  Filled 2020-09-15: qty 1

## 2020-09-15 MED ORDER — DIPHENHYDRAMINE HCL 12.5 MG/5ML PO ELIX: 12.5000 mg | ORAL_SOLUTION | ORAL | Status: DC | PRN

## 2020-09-15 MED ORDER — ORAL CARE MOUTH RINSE: 15.0000 mL | Freq: Once | OROMUCOSAL | Status: DC

## 2020-09-15 MED ORDER — MENTHOL 3 MG MT LOZG
1.0000 | LOZENGE | OROMUCOSAL | Status: DC | PRN
  Filled 2020-09-15: qty 9

## 2020-09-15 MED ORDER — ONDANSETRON HCL 4 MG/2ML IJ SOLN: 4.0000 mg | Freq: Once | INTRAMUSCULAR | Status: DC | PRN

## 2020-09-15 MED ORDER — CHLORHEXIDINE GLUCONATE 0.12 % MT SOLN: 15.0000 mL | Freq: Once | OROMUCOSAL | Status: DC

## 2020-09-15 MED ORDER — PHENOL 1.4 % MT LIQD
1.0000 | OROMUCOSAL | Status: DC | PRN
  Filled 2020-09-15: qty 177

## 2020-09-15 MED ORDER — TRAZODONE HCL 100 MG PO TABS
100.0000 mg | ORAL_TABLET | Freq: Every evening | ORAL | Status: DC | PRN
  Administered 2020-09-15: 100 mg via ORAL
  Filled 2020-09-15 (×2): qty 1

## 2020-09-15 MED ORDER — ALUM & MAG HYDROXIDE-SIMETH 200-200-20 MG/5ML PO SUSP: 30.0000 mL | ORAL | Status: DC | PRN

## 2020-09-15 MED ORDER — 0.9 % SODIUM CHLORIDE (POUR BTL) OPTIME
TOPICAL | Status: DC | PRN
  Administered 2020-09-15: 1000 mL

## 2020-09-15 MED ORDER — MIDAZOLAM HCL 2 MG/2ML IJ SOLN
INTRAMUSCULAR | Status: AC
  Filled 2020-09-15: qty 2

## 2020-09-15 MED ORDER — MIDAZOLAM HCL 5 MG/5ML IJ SOLN
INTRAMUSCULAR | Status: DC | PRN
  Administered 2020-09-15 (×2): 1 mg via INTRAVENOUS

## 2020-09-15 MED ORDER — LISINOPRIL-HYDROCHLOROTHIAZIDE 20-12.5 MG PO TABS: 1.0000 | ORAL_TABLET | Freq: Every day | ORAL | Status: DC

## 2020-09-15 MED ORDER — HEMOSTATIC AGENTS (NO CHARGE) OPTIME
TOPICAL | Status: DC | PRN
  Administered 2020-09-15: 2 via TOPICAL

## 2020-09-15 MED ORDER — SODIUM CHLORIDE 0.9 % IV SOLN
INTRAVENOUS | Status: DC | PRN
  Administered 2020-09-15: 75 ug/min via INTRAVENOUS

## 2020-09-15 MED ORDER — BUPIVACAINE HCL (PF) 0.5 % IJ SOLN
INTRAMUSCULAR | Status: DC | PRN
  Administered 2020-09-15: 3 mL

## 2020-09-15 MED ORDER — LISINOPRIL 20 MG PO TABS
20.0000 mg | ORAL_TABLET | Freq: Every day | ORAL | Status: DC
  Administered 2020-09-15 – 2020-09-17 (×3): 20 mg via ORAL
  Filled 2020-09-15 (×3): qty 1

## 2020-09-15 MED ORDER — SODIUM CHLORIDE (PF) 0.9 % IJ SOLN: INTRAMUSCULAR | Status: DC | PRN

## 2020-09-15 MED ORDER — DOCUSATE SODIUM 100 MG PO CAPS
100.0000 mg | ORAL_CAPSULE | Freq: Two times a day (BID) | ORAL | Status: DC
  Administered 2020-09-15 – 2020-09-17 (×3): 100 mg via ORAL
  Filled 2020-09-15 (×4): qty 1

## 2020-09-15 MED ORDER — CEFAZOLIN SODIUM-DEXTROSE 2-4 GM/100ML-% IV SOLN
INTRAVENOUS | Status: AC
  Filled 2020-09-15: qty 100

## 2020-09-15 MED ORDER — HYDROCODONE-ACETAMINOPHEN 7.5-325 MG PO TABS
1.0000 | ORAL_TABLET | ORAL | Status: DC | PRN
  Administered 2020-09-15: 2 via ORAL
  Filled 2020-09-15 (×2): qty 2
  Filled 2020-09-15: qty 1

## 2020-09-15 MED ORDER — POLYETHYLENE GLYCOL 3350 17 G PO PACK: 17.0000 g | PACK | Freq: Every day | ORAL | Status: DC | PRN

## 2020-09-15 MED ORDER — HYDROCHLOROTHIAZIDE 12.5 MG PO CAPS
12.5000 mg | ORAL_CAPSULE | Freq: Every day | ORAL | Status: DC
  Administered 2020-09-15 – 2020-09-17 (×3): 12.5 mg via ORAL
  Filled 2020-09-15 (×3): qty 1

## 2020-09-15 MED ORDER — SODIUM CHLORIDE 0.9 % IV SOLN: INTRAVENOUS | Status: DC

## 2020-09-15 MED ORDER — PHENYLEPHRINE HCL (PRESSORS) 10 MG/ML IV SOLN
INTRAVENOUS | Status: DC | PRN
  Administered 2020-09-15: 100 ug via INTRAVENOUS

## 2020-09-15 MED ORDER — ENOXAPARIN SODIUM 40 MG/0.4ML IJ SOSY
40.0000 mg | PREFILLED_SYRINGE | INTRAMUSCULAR | Status: DC
  Administered 2020-09-16 – 2020-09-17 (×2): 40 mg via SUBCUTANEOUS
  Filled 2020-09-15 (×2): qty 0.4

## 2020-09-15 MED ORDER — EPHEDRINE SULFATE 50 MG/ML IJ SOLN
INTRAMUSCULAR | Status: DC | PRN
  Administered 2020-09-15: 10 mg via INTRAVENOUS

## 2020-09-15 MED ORDER — AZELASTINE-FLUTICASONE 137-50 MCG/ACT NA SUSP: 1.0000 | Freq: Two times a day (BID) | NASAL | Status: DC

## 2020-09-15 MED ORDER — FENTANYL CITRATE (PF) 100 MCG/2ML IJ SOLN: 25.0000 ug | INTRAMUSCULAR | Status: DC | PRN

## 2020-09-15 MED ORDER — FAMOTIDINE 20 MG PO TABS
ORAL_TABLET | ORAL | Status: AC
  Administered 2020-09-15: 20 mg via ORAL
  Filled 2020-09-15: qty 1

## 2020-09-15 MED ORDER — DOCUSATE SODIUM 100 MG PO CAPS: 100.0000 mg | ORAL_CAPSULE | Freq: Two times a day (BID) | ORAL | Status: DC

## 2020-09-15 MED ORDER — PHENYLEPHRINE HCL (PRESSORS) 10 MG/ML IV SOLN
INTRAVENOUS | Status: AC
  Filled 2020-09-15: qty 1

## 2020-09-15 MED ORDER — ACETAMINOPHEN 10 MG/ML IV SOLN
INTRAVENOUS | Status: AC
  Filled 2020-09-15: qty 100

## 2020-09-15 MED ORDER — TRAMADOL HCL 50 MG PO TABS
50.0000 mg | ORAL_TABLET | Freq: Four times a day (QID) | ORAL | Status: DC
  Administered 2020-09-15 – 2020-09-17 (×8): 50 mg via ORAL
  Filled 2020-09-15 (×9): qty 1

## 2020-09-15 MED ORDER — ZOLPIDEM TARTRATE 5 MG PO TABS: 5.0000 mg | ORAL_TABLET | Freq: Every evening | ORAL | Status: DC | PRN

## 2020-09-15 MED ORDER — ACETAMINOPHEN 325 MG PO TABS: 325.0000 mg | ORAL_TABLET | Freq: Four times a day (QID) | ORAL | Status: DC | PRN

## 2020-09-15 MED ORDER — HYDROCODONE-ACETAMINOPHEN 5-325 MG PO TABS
1.0000 | ORAL_TABLET | ORAL | Status: DC | PRN
  Administered 2020-09-15: 2 via ORAL
  Administered 2020-09-16: 1 via ORAL
  Administered 2020-09-16 (×3): 2 via ORAL
  Administered 2020-09-16: 1 via ORAL
  Administered 2020-09-17 (×3): 2 via ORAL
  Administered 2020-09-17: 1 via ORAL
  Filled 2020-09-15: qty 2
  Filled 2020-09-15: qty 1
  Filled 2020-09-15 (×3): qty 2
  Filled 2020-09-15: qty 1
  Filled 2020-09-15 (×2): qty 2
  Filled 2020-09-15: qty 1
  Filled 2020-09-15 (×2): qty 2

## 2020-09-15 MED ORDER — SODIUM CHLORIDE FLUSH 0.9 % IV SOLN
INTRAVENOUS | Status: AC
  Filled 2020-09-15: qty 40

## 2020-09-15 MED ORDER — BUPIVACAINE LIPOSOME 1.3 % IJ SUSP
INTRAMUSCULAR | Status: AC
  Filled 2020-09-15: qty 20

## 2020-09-15 MED ORDER — CEFAZOLIN SODIUM-DEXTROSE 2-4 GM/100ML-% IV SOLN
2.0000 g | Freq: Four times a day (QID) | INTRAVENOUS | Status: AC
  Administered 2020-09-15 (×2): 2 g via INTRAVENOUS
  Filled 2020-09-15 (×2): qty 100

## 2020-09-15 MED ORDER — BUPIVACAINE-EPINEPHRINE (PF) 0.25% -1:200000 IJ SOLN
INTRAMUSCULAR | Status: AC
  Filled 2020-09-15: qty 30

## 2020-09-15 MED ORDER — GLYCOPYRROLATE 0.2 MG/ML IJ SOLN
INTRAMUSCULAR | Status: DC | PRN
  Administered 2020-09-15: .2 mg via INTRAVENOUS

## 2020-09-15 MED ORDER — PROPOFOL 500 MG/50ML IV EMUL
INTRAVENOUS | Status: DC | PRN
  Administered 2020-09-15: 200 ug/kg/min via INTRAVENOUS

## 2020-09-15 MED ORDER — FLUTICASONE PROPIONATE 50 MCG/ACT NA SUSP
1.0000 | Freq: Two times a day (BID) | NASAL | Status: DC
  Filled 2020-09-15: qty 16

## 2020-09-15 MED ORDER — ONDANSETRON HCL 4 MG PO TABS: 4.0000 mg | ORAL_TABLET | Freq: Four times a day (QID) | ORAL | Status: DC | PRN

## 2020-09-15 MED ORDER — PROPOFOL 1000 MG/100ML IV EMUL
INTRAVENOUS | Status: AC
  Filled 2020-09-15: qty 100

## 2020-09-15 MED ORDER — BISACODYL 5 MG PO TBEC: 5.0000 mg | DELAYED_RELEASE_TABLET | Freq: Every day | ORAL | Status: DC | PRN

## 2020-09-15 MED ORDER — FENTANYL CITRATE (PF) 100 MCG/2ML IJ SOLN
INTRAMUSCULAR | Status: AC
  Filled 2020-09-15: qty 2

## 2020-09-15 MED ORDER — MORPHINE SULFATE (PF) 2 MG/ML IV SOLN
0.5000 mg | INTRAVENOUS | Status: DC | PRN
  Administered 2020-09-15: 1 mg via INTRAVENOUS
  Administered 2020-09-15: 0.5 mg via INTRAVENOUS
  Filled 2020-09-15 (×2): qty 1

## 2020-09-15 MED ORDER — ONDANSETRON HCL 4 MG/2ML IJ SOLN: 4.0000 mg | Freq: Four times a day (QID) | INTRAMUSCULAR | Status: DC | PRN

## 2020-09-15 MED ORDER — PANTOPRAZOLE SODIUM 40 MG PO TBEC
40.0000 mg | DELAYED_RELEASE_TABLET | Freq: Every day | ORAL | Status: DC
  Administered 2020-09-15 – 2020-09-17 (×3): 40 mg via ORAL
  Filled 2020-09-15 (×3): qty 1

## 2020-09-15 MED ORDER — FLEET ENEMA 7-19 GM/118ML RE ENEM: 1.0000 | ENEMA | Freq: Once | RECTAL | Status: DC | PRN

## 2020-09-15 SURGICAL SUPPLY — 63 items
BLADE SAGITTAL AGGR TOOTH XLG (BLADE) ×2 IMPLANT
BNDG COHESIVE 6X5 TAN ST LF (GAUZE/BANDAGES/DRESSINGS) ×4 IMPLANT
CANISTER SUCT 1200ML W/VALVE (MISCELLANEOUS) IMPLANT
CANISTER WOUND CARE 500ML ATS (WOUND CARE) ×2 IMPLANT
CHLORAPREP W/TINT 26 (MISCELLANEOUS) ×2 IMPLANT
COVER BACK TABLE REUSABLE LG (DRAPES) ×2 IMPLANT
DRAPE 3/4 80X56 (DRAPES) ×6 IMPLANT
DRAPE C-ARM XRAY 36X54 (DRAPES) ×2 IMPLANT
DRAPE INCISE IOBAN 66X60 STRL (DRAPES) IMPLANT
DRAPE POUCH INSTRU U-SHP 10X18 (DRAPES) ×2 IMPLANT
DRESSING SURGICEL FIBRLLR 1X2 (HEMOSTASIS) ×2 IMPLANT
DRSG MEPILEX SACRM 8.7X9.8 (GAUZE/BANDAGES/DRESSINGS) ×2 IMPLANT
DRSG OPSITE POSTOP 4X8 (GAUZE/BANDAGES/DRESSINGS) ×4 IMPLANT
DRSG SURGICEL FIBRILLAR 1X2 (HEMOSTASIS) ×4
ELECT BLADE 6.5 EXT (BLADE) ×2 IMPLANT
ELECT REM PT RETURN 9FT ADLT (ELECTROSURGICAL) ×2
ELECTRODE REM PT RTRN 9FT ADLT (ELECTROSURGICAL) ×1 IMPLANT
GAUZE 4X4 16PLY ~~LOC~~+RFID DBL (SPONGE) ×2 IMPLANT
GLOVE SURG SYN 9.0  PF PI (GLOVE) ×2
GLOVE SURG SYN 9.0 PF PI (GLOVE) ×2 IMPLANT
GLOVE SURG UNDER POLY LF SZ9 (GLOVE) ×2 IMPLANT
GOWN SRG 2XL LVL 4 RGLN SLV (GOWNS) ×1 IMPLANT
GOWN STRL NON-REIN 2XL LVL4 (GOWNS) ×1
GOWN STRL REUS W/ TWL LRG LVL3 (GOWN DISPOSABLE) ×1 IMPLANT
GOWN STRL REUS W/TWL LRG LVL3 (GOWN DISPOSABLE) ×1
HEAD FEMORAL SZ28 LGE BIOLOX (Head) ×2 IMPLANT
HEMOVAC 400CC 10FR (MISCELLANEOUS) IMPLANT
HOLDER FOLEY CATH W/STRAP (MISCELLANEOUS) ×2 IMPLANT
HOOD PEEL AWAY FLYTE STAYCOOL (MISCELLANEOUS) ×4 IMPLANT
IRRIGATION SURGIPHOR STRL (IV SOLUTION) IMPLANT
KIT PREVENA INCISION MGT 13 (CANNISTER) ×2 IMPLANT
LINER DBL MOB SZ 0 52MM (Liner) ×2 IMPLANT
MANIFOLD NEPTUNE II (INSTRUMENTS) ×2 IMPLANT
MASTERLOC HIP LATERAL S6 (Hips) ×2 IMPLANT
MAT ABSORB  FLUID 56X50 GRAY (MISCELLANEOUS) ×1
MAT ABSORB FLUID 56X50 GRAY (MISCELLANEOUS) ×1 IMPLANT
NDL SAFETY ECLIPSE 18X1.5 (NEEDLE) ×1 IMPLANT
NEEDLE HYPO 18GX1.5 SHARP (NEEDLE) ×1
NEEDLE SPNL 20GX3.5 QUINCKE YW (NEEDLE) ×4 IMPLANT
NS IRRIG 1000ML POUR BTL (IV SOLUTION) ×2 IMPLANT
PACK HIP COMPR (MISCELLANEOUS) ×2 IMPLANT
SCALPEL PROTECTED #10 DISP (BLADE) ×4 IMPLANT
SHELL ACETABULAR SZ 52 DM (Shell) ×2 IMPLANT
SOL PREP PVP 2OZ (MISCELLANEOUS) ×2
SOLUTION PREP PVP 2OZ (MISCELLANEOUS) ×1 IMPLANT
SPONGE DRAIN TRACH 4X4 STRL 2S (GAUZE/BANDAGES/DRESSINGS) IMPLANT
SPONGE T-LAP 18X18 ~~LOC~~+RFID (SPONGE) ×4 IMPLANT
STAPLER SKIN PROX 35W (STAPLE) ×2 IMPLANT
STRAP SAFETY 5IN WIDE (MISCELLANEOUS) ×2 IMPLANT
SUT DVC 2 QUILL PDO  T11 36X36 (SUTURE) ×1
SUT DVC 2 QUILL PDO T11 36X36 (SUTURE) ×1 IMPLANT
SUT SILK 0 (SUTURE) ×1
SUT SILK 0 30XBRD TIE 6 (SUTURE) ×1 IMPLANT
SUT V-LOC 90 ABS DVC 3-0 CL (SUTURE) ×2 IMPLANT
SUT VIC AB 1 CT1 36 (SUTURE) ×2 IMPLANT
SYR 20ML LL LF (SYRINGE) ×2 IMPLANT
SYR 30ML LL (SYRINGE) ×2 IMPLANT
SYR 50ML LL SCALE MARK (SYRINGE) ×4 IMPLANT
SYR BULB IRRIG 60ML STRL (SYRINGE) ×2 IMPLANT
TAPE MICROFOAM 4IN (TAPE) IMPLANT
TOWEL OR 17X26 4PK STRL BLUE (TOWEL DISPOSABLE) ×2 IMPLANT
TRAY FOLEY MTR SLVR 16FR STAT (SET/KITS/TRAYS/PACK) ×2 IMPLANT
WATER STERILE IRR 500ML POUR (IV SOLUTION) IMPLANT

## 2020-09-15 NOTE — Plan of Care (Signed)
?  Problem: Clinical Measurements: ?Goal: Will remain free from infection ?Outcome: Progressing ?  ?

## 2020-09-15 NOTE — Op Note (Signed)
09/15/2020  8:58 AM  PATIENT:  Tabitha Lewis  70 y.o. female  PRE-OPERATIVE DIAGNOSIS:  Primary osteoarthritis of right hip M16.11  POST-OPERATIVE DIAGNOSIS:  Primary osteoarthritis of right hip  PROCEDURE:  Procedure(s): TOTAL HIP ARTHROPLASTY ANTERIOR APPROACH (Right)  SURGEON: Leitha Schuller, MD  ASSISTANTS: Baldwin Jamaica, PA C  ANESTHESIA:   spinal  EBL:  Total I/O In: 800 [I.V.:700; IV Piggyback:100] Out: 300 [Urine:150; Blood:150]  BLOOD ADMINISTERED:none  DRAINS:  Incisional wound VAC    LOCAL MEDICATIONS USED:  MARCAINE    and OTHER Exparel  SPECIMEN: Right femoral head  DISPOSITION OF SPECIMEN:  PATHOLOGY  COUNTS:  YES  TOURNIQUET:  * No tourniquets in log *  IMPLANTS: Medacta Masterloc 6 LAT stem with ceramic L 28 mm head, 52 mm Mpact DM cup and liner  DICTATION: .Dragon Dictation   The patient was brought to the operating room and after spinal anesthesia was obtained patient was placed on the operative table with the ipsilateral foot into the Medacta attachment, contralateral leg on a well-padded table. C-arm was brought in and preop template x-ray taken. After prepping and draping in usual sterile fashion appropriate patient identification and timeout procedures were completed. Anterior approach to the hip was obtained and centered over the greater trochanter and TFL muscle. The subcutaneous tissue was incised hemostasis being achieved by electrocautery. TFL fascia was incised and the muscle retracted laterally deep retractor placed. The lateral femoral circumflex vessels were identified and ligated. The anterior capsule was exposed and a capsulotomy performed. The neck was identified and a femoral neck cut carried out with a saw. The head was removed without difficulty and showed sclerotic femoral head and acetabulum. Reaming was carried out to 52 mm and a 52 mm cup trial gave appropriate tightness to the acetabular component a 52 DM cup was impacted into  position. The leg was then externally rotated and ischiofemoral and pubofemoral releases carried out. The femur was sequentially broached to a size 6, size 6 lateralized with S then M head trials were placed and the final components chosen. The 6 lateralized Master lock stem was inserted along with a ceramic L 28 mm head and 52 mm liner. The hip was reduced and was stable the wound was thoroughly irrigated with fibrillar placed along the posterior capsule and medial neck. The deep fascia ws closed using a heavy Quill after infiltration of 30 cc of quarter percent Sensorcaine with epinephrine diluted with Exparel throughout the case .3-0 V-loc to close the skin with skin staples.  Incisional wound VAC applied and patient was sent to recovery in stable condition.   PLAN OF CARE: Admit for overnight observation

## 2020-09-15 NOTE — Transfer of Care (Signed)
Immediate Anesthesia Transfer of Care Note  Patient: Tabitha Lewis  Procedure(s) Performed: TOTAL HIP ARTHROPLASTY ANTERIOR APPROACH (Right: Hip)  Patient Location: PACU  Anesthesia Type:Spinal  Level of Consciousness: awake, alert  and oriented  Airway & Oxygen Therapy: Patient Spontanous Breathing and Patient connected to nasal cannula oxygen  Post-op Assessment: Report given to RN and Post -op Vital signs reviewed and stable  Post vital signs: Reviewed and stable  Last Vitals:  Vitals Value Taken Time  BP    Temp    Pulse 102 09/15/20 0854  Resp 10 09/15/20 0854  SpO2 100 % 09/15/20 0854  Vitals shown include unvalidated device data.  Last Pain:  Vitals:   09/15/20 0622  TempSrc: Temporal  PainSc: 0-No pain      Patients Stated Pain Goal: 0 (09/15/20 0622)  Complications: No notable events documented.

## 2020-09-15 NOTE — Anesthesia Procedure Notes (Signed)
Spinal  Patient location during procedure: OR Start time: 09/15/2020 7:20 AM End time: 09/15/2020 7:28 AM Reason for block: surgical anesthesia Staffing Performed: resident/CRNA  Resident/CRNA: Nelda Marseille, CRNA Preanesthetic Checklist Completed: patient identified, IV checked, site marked, risks and benefits discussed, surgical consent, monitors and equipment checked, pre-op evaluation and timeout performed Spinal Block Patient position: sitting Prep: Betadine Patient monitoring: heart rate, continuous pulse ox, blood pressure and cardiac monitor Approach: midline Location: L3-4 Injection technique: single-shot Needle Needle type: Whitacre and Introducer  Needle gauge: 25 G Needle length: 9 cm Assessment Sensory level: T10 Events: CSF return Additional Notes Negative paresthesia. Negative blood return. Positive free-flowing CSF. Expiration date of kit checked and confirmed. Patient tolerated procedure well, without complications.

## 2020-09-15 NOTE — H&P (Signed)
Chief Complaint  Patient presents with   Right Hip - Follow-up, Pain    History of the Present Illness: Tabitha Lewis is a 70 y.o. female here today.   The patient presents for history and physical for right total hip arthroplasty scheduled for 09/15/2020.  The patient states she injured her left leg on 07/13/2020. She notes she is having to use her walker more for her left leg. She had x-rays obtained at [facility], and she was put on steroids and muscle relaxants. She notes she has gotten better, and walking is not as painful. She states she is a bit unstable and was given a brace.   The patient states she had a kidney injury in 03/2020, and her GFR fell from 70s to the 30s. She has hypertension, and she has a monitor for this. She has had a blood clot in the past. The patient has interstitial cystitis with a tendency toward urinary tract infections.   The patient is allergic to NICKEL.   The patient is going to the beach for a family gathering on 08/29/2020.   I have reviewed past medical, surgical, social and family history, and allergies as documented in the EMR.  Past Medical History: Past Medical History:  Diagnosis Date   AKI (acute kidney injury) (CMS-HCC) 05/11/2020   Allergic state   Anxiety 2012   Cataract cortical, senile 2011   Chicken pox   Depression 2012   Hypertension 1983   Type 2 diabetes, diet controlled (CMS-HCC)   Past Surgical History: Past Surgical History:  Procedure Laterality Date   CATARACT EXTRACTION 2011   FRACTURE SURGERY As a child  Arm   Past Family History: Family History  Problem Relation Age of Onset   Leukemia Mother   Heart disease Mother   Breast cancer Mother   Anxiety Mother   Osteoarthritis Mother   Stroke Father   Coronary Artery Disease (Blocked arteries around heart) Father   High blood pressure (Hypertension) Father   Breast cancer Sister   High blood pressure (Hypertension) Sister   Osteoarthritis Sister    Breast cancer Maternal Aunt   Breast cancer Maternal Grandmother   Coronary Artery Disease (Blocked arteries around heart) Maternal Grandmother   Stroke Maternal Grandmother   High blood pressure (Hypertension) Maternal Grandmother   Medications: Current Outpatient Medications Ordered in Epic  Medication Sig Dispense Refill   azelastine-fluticasone 137-50 mcg/spray Spry 1 spray 2 (two) times daily   cetirizine (ZYRTEC) 10 MG tablet Take 10 mg by mouth once daily as needed for Allergies (weekly prior to allergy shots).   desoximetasone (TOPICORT) 0.25 % cream APPLY TWICE DAILY TO HANDS   lisinopriL-hydrochlorothiazide (ZESTORETIC) 20-12.5 mg tablet Take 1 tablet by mouth once daily   multivitamin capsule Take 1 capsule by mouth once daily   olopatadine (PATADAY) 0.2 % ophthalmic solution One drop to each eye daily   traMADoL (ULTRAM) 50 mg tablet Take 1 tablet (50 mg total) by mouth every 6 (six) hours as needed for Pain 10 tablet 0   traZODone (DESYREL) 100 MG tablet TAKE 1 TABLET (100 MG TOTAL) BY MOUTH AT BEDTIME AS NEEDED FOR SLEEP.   No current Epic-ordered facility-administered medications on file.   Allergies: Allergies  Allergen Reactions   Amlodipine Other (See Comments)  Leg swelling max dose   Codeine Rash   Neosporin [Benzalkonium Chloride] Rash   Nsaids (Non-Steroidal Anti-Inflammatory Drug) Other (See Comments)  AKI  Polynephritis    Body mass index is 29.25 kg/m.  Review of Systems: A comprehensive 14 point ROS was performed, reviewed, and the pertinent orthopaedic findings are documented in the HPI.  Vitals:  08/24/20 1306  BP: 132/66    General Physical Examination:   General/Constitutional: No apparent distress: well-nourished and well developed. Eyes: Pupils equal, round with synchronous movement. Lungs:  Clear to auscultation HEENT:  Normal Vascular: No edema, swelling or tenderness, except as noted in detailed exam. Cardiac: Heart rate and rhythm  is regular. Integumentary: No impressive skin lesions present, except as noted in detailed exam. Neuro/Psych: Normal mood and affect, oriented to person, place and time.  Musculoskeletal Examination:  On exam, right hip has 0 degrees internal rotation and 30 degrees external.  On exam, right hip has 0 degrees internal rotation and 30 degrees external. Stable left knee.   Radiographs:  No results were obtained or interpreted today.  Assessment: ICD-10-CM  1. Primary osteoarthritis of right hip M16.11  2. Type 2 diabetes, diet controlled (CMS-HCC) E11.9  3. Primary localized osteoarthritis of left knee M17.12   Plan:  The patient has clinical findings of right hip stiffness.  We discussed the patient's prior x-ray findings. I explained she has become stiffer than she was at her last visit. I explained the surgery and postoperative course in detail. I advised her to tell the hospital staff she does not want any anti-inflammatories so she is not given Toradol after surgery, as she is on a blood thinner. I recommend a knee sleeve for her right knee.  The patient is scheduled for right total hip arthroplasty on 09/15/2020.  Surgical Risks:  The nature of the condition and the proposed procedure has been reviewed in detail with the patient. Surgical versus non-surgical options and prognosis for recovery have been reviewed and the inherent risks and benefits of each have been discussed including the risks of infection, bleeding, injury to nerves/blood vessels/tendons, incomplete relief of symptoms, persisting pain and/or stiffness, loss of function, complex regional pain syndrome, failure of the procedure, as appropriate.  Teeth: Normal  Attestation: I, Dawn Royse, am documenting for [facility], MD utilizing Nuance DAX.    Electronically signed by Marlena Clipper, MD at 08/24/2020 8:09 PM EDT Reviewed  H+P. No changes noted.

## 2020-09-15 NOTE — Evaluation (Signed)
Physical Therapy Evaluation Patient Details Name: Tabitha Lewis MRN: [id number] DOB: 09/11/1950 Today's Date: 09/15/2020   History of Present Illness  Pt is a 70 y.o. female s/p R THA anterior approach 09/15/20.  Pt reports pulling her L hamstring July 27th 2022 (heard it pop)--got it checked out and pt/daughter report x-rays were negative for fx.  PMH includes AKI, anxiety, htn, DM, fx sx of arm (as a child).  Clinical Impression  Prior to hospital admission, pt was ambulating with rollator since pulling her L hamstring July 27th 2022 (pt/daughter report getting it checked out and x-ray showing nothing broken); lives alone on main level of home with 5 STE B railings; pt's daughter plans to initially work from pt's home during day and pt's grandson (23 y.o) plans to stay with pt at night to assist.  Pt received morphine recently (per pt's nurse and pt) and pt agreeable to PT session; pt's daughter present during session.  Currently pt is min to mod assist with bed mobility; min to mod assist with transfers; and CGA to min assist to ambulate a couple feet bed towards recliner with RW but ultimately deferred getting pt to recliner d/t pt tearful (d/t pain R LE) so pt assisted back to bed and repositioned to improve comfort.  Pain R hip 5-6/10 (and posterior L knee/thigh 4-5/10) at rest beginning of session; pain increased in B LE's with mobility; and pain R hip 6-7/10 (and posterior L knee/thigh 4-5/10) end of session at rest.  Nurse present to give pt pain meds end of session.  Pt would benefit from skilled PT to address noted impairments and functional limitations (see below for any additional details).  Upon hospital discharge, pt would benefit from SNF (pt would like to discharge home with family support instead).    Follow Up Recommendations SNF (pt/family want home if possible)    Equipment Recommendations  Rolling walker with 5" wheels;3in1 (PT)    Recommendations for Other Services OT  consult     Precautions / Restrictions Precautions Precautions: Fall;Anterior Hip Precaution Booklet Issued: Yes (comment) Precaution Comments: wound vac Restrictions Weight Bearing Restrictions: Yes RLE Weight Bearing: Weight bearing as tolerated Other Position/Activity Restrictions: pt reports "pulling her L hamstring" July 27th 2022 and has had pain since      Mobility  Bed Mobility Overal bed mobility: Needs Assistance Bed Mobility: Supine to Sit;Sit to Supine     Supine to sit: Min assist;Mod assist;HOB elevated Sit to supine: Min assist;Mod assist;HOB elevated   General bed mobility comments: vc's for technique; use of bed rails; assist for R>L LE; assist for trunk    Transfers Overall transfer level: Needs assistance Equipment used: Rolling walker (2 wheeled) Transfers: Sit to/from Stand Sit to Stand: Min assist;Mod assist         General transfer comment: assist to initiate and come to full stand; assist to control descent sitting; vc's for UE/LE placement and overall technique  Ambulation/Gait Ambulation/Gait assistance: Min guard;Min assist Gait Distance (Feet): 2 Feet Assistive device: Rolling walker (2 wheeled) Gait Pattern/deviations: Antalgic Gait velocity: decreased   General Gait Details: decreased stance time R LE; increased effort and time to move R LE; vc's for walker use and gait technique; limited distance d/t R hip/thigh pain (pt tearful d/t pain)  Stairs            Wheelchair Mobility    Modified Rankin (Stroke Patients Only)       Balance Overall balance assessment: Needs assistance Sitting-balance  support: No upper extremity supported;Feet supported Sitting balance-Leahy Scale: Good Sitting balance - Comments: steady sitting reaching within BOS   Standing balance support: Single extremity supported Standing balance-Leahy Scale: Fair Standing balance comment: pt requiring at least single UE support for static standing  balance                             Pertinent Vitals/Pain Pain Assessment: 0-10 Pain Score: 7  Pain Location: R hip/thigh Pain Descriptors / Indicators: Tender;Sore;Grimacing (tearful during mobility) Pain Intervention(s): Limited activity within patient's tolerance;Monitored during session;Premedicated before session;Repositioned;Patient requesting pain meds-RN notified;Other (comment) (RN present to give pain meds end of session) Vitals (HR and O2 on room air) stable and WFL throughout treatment session.    Home Living Family/patient expects to be discharged to:: Private residence Living Arrangements: Alone Available Help at Discharge: Family;Available 24 hours/day Type of Home: House Home Access: Stairs to enter Entrance Stairs-Rails: Right;Left;Can reach both Entrance Stairs-Number of Steps: 5 Home Layout: Two level;Able to live on main level with bedroom/bathroom Home Equipment: Grab bars - tub/shower;Walker - 4 wheels;[place] - single point      Prior Function Level of Independence: Independent with assistive device(s)         Comments: Ambulating with RW since pulling her L hamstring July 27th 2022; prior to that was ambulating with SPC.  Family able to come in/out to help.  Pt's daughter able to assist during the day (plans to work from home initially) and pt's 30  y.o. grandson plans to stay the night to assist.     Hand Dominance        Extremity/Trunk Assessment   Upper Extremity Assessment Upper Extremity Assessment: Overall WFL for tasks assessed    Lower Extremity Assessment Lower Extremity Assessment: RLE deficits/detail;LLE deficits/detail RLE Deficits / Details: DF 2+/5; at least 3/5 AROM ankle inversion/eversion; good R quad set strength; at least 3-/5 R hip flexion RLE: Unable to fully assess due to pain LLE Deficits / Details: hip flexion at least 3+/5; at least 3/5 AROM knee flexion/extension and DF/PF LLE: Unable to fully assess due to  pain    Cervical / Trunk Assessment Cervical / Trunk Assessment: Normal  Communication   Communication: No difficulties  Cognition Arousal/Alertness: Awake/alert Behavior During Therapy: Anxious Overall Cognitive Status: Within Functional Limits for tasks assessed                                 General Comments: Tearful with mobility d/t R hip/thigh pain      General Comments General comments (skin integrity, edema, etc.): R hip wound vac in place beginning/end of session.  Nursing cleared pt for participation in physical therapy.  Pt agreeable to PT session.    Exercises Total Joint Exercises Ankle Circles/Pumps: AROM;Left;AAROM;Right;Strengthening;10 reps;Supine Quad Sets: AROM;Strengthening;Both;10 reps;Supine Gluteal Sets: AROM;Strengthening;Both;10 reps;Supine Towel Squeeze: AROM;Strengthening;Both;10 reps;Supine Short Arc Quad: AAROM;Strengthening;Right;10 reps;Supine Heel Slides: AAROM;Strengthening;Right;10 reps;Supine Hip ABduction/ADduction: AAROM;Strengthening;Right;10 reps;Supine   Assessment/Plan    PT Assessment Patient needs continued PT services  PT Problem List Decreased strength;Decreased activity tolerance;Decreased balance;Decreased mobility;Decreased knowledge of use of DME;Decreased knowledge of precautions;Pain;Decreased skin integrity       PT Treatment Interventions DME instruction;Gait training;Stair training;Functional mobility training;Therapeutic activities;Therapeutic exercise;Balance training;Patient/family education    PT Goals (Current goals can be found in the Care Plan section)  Acute Rehab PT Goals Patient Stated Goal: to improve pain  and mobility PT Goal Formulation: With patient/family Time For Goal Achievement: 09/29/20 Potential to Achieve Goals: Good    Frequency BID   Barriers to discharge Decreased caregiver support Level of assist    Co-evaluation               AM-PAC PT "6 Clicks" Mobility  Outcome  Measure Help needed turning from your back to your side while in a flat bed without using bedrails?: A Little Help needed moving from lying on your back to sitting on the side of a flat bed without using bedrails?: A Lot Help needed moving to and from a bed to a chair (including a wheelchair)?: A Little Help needed standing up from a chair using your arms (e.g., wheelchair or bedside chair)?: A Lot Help needed to walk in hospital room?: Total Help needed climbing 3-5 steps with a railing? : Total 6 Click Score: 12    End of Session Equipment Utilized During Treatment: Gait belt Activity Tolerance: Patient limited by pain Patient left: in bed;with call bell/phone within reach;with bed alarm set;with nursing/sitter in room;with family/visitor present;with SCD's reapplied;Other (comment) (B heels floating via pillow support) Nurse Communication: Mobility status;Patient requests pain meds;Precautions;Weight bearing status;Other (comment) (pt's IV beeping) PT Visit Diagnosis: Other abnormalities of gait and mobility (R26.89);Muscle weakness (generalized) (M62.81);Difficulty in walking, not elsewhere classified (R26.2);Pain Pain - Right/Left: Right Pain - part of body: Hip    Time: 2637-8588 PT Time Calculation (min) (ACUTE ONLY): 57 min   Charges:   PT Evaluation $PT Eval Low Complexity: 1 Low PT Treatments $Therapeutic Exercise: 8-22 mins $Therapeutic Activity: 23-37 mins       Hendricks Limes, PT 09/15/20, 5:45 PM

## 2020-09-15 NOTE — Anesthesia Preprocedure Evaluation (Signed)
Anesthesia Evaluation  Patient identified by MRN, date of birth, ID band Patient awake    Reviewed: Allergy & Precautions, NPO status , Patient's Chart, lab work & pertinent test results  History of Anesthesia Complications Negative for: history of anesthetic complications  Airway Mallampati: II  TM Distance: >3 FB Neck ROM: Full    Dental no notable dental hx.    Pulmonary neg sleep apnea, neg COPD, former smoker,    breath sounds clear to auscultation- rhonchi (-) wheezing      Cardiovascular hypertension, Pt. on medications (-) CAD, (-) Past MI, (-) Cardiac Stents and (-) CABG  Rhythm:Regular Rate:Normal - Systolic murmurs and - Diastolic murmurs    Neuro/Psych neg Seizures PSYCHIATRIC DISORDERS Anxiety Depression negative neurological ROS     GI/Hepatic negative GI ROS, Neg liver ROS,   Endo/Other  diabetes  Renal/GU Renal InsufficiencyRenal disease     Musculoskeletal  (+) Arthritis ,   Abdominal (+) - obese,   Peds  Hematology negative hematology ROS (+)   Anesthesia Other Findings Past Medical History: No date: AKI (acute kidney injury) ([place])     Comment:  03/2020-04/2020 No date: Allergy     Comment:  i.e contact allergies [facility];               also plants, trees  No date: Chicken pox No date: Depression No date: Diabetes mellitus without complication (HCC)     Comment:  DIET CONTROLLED No date: Family history of breast cancer     Comment:  BRCA negative in the past No date: Family history of breast cancer No date: Family history of lung cancer No date: Family history of rectal cancer No date: History of prediabetes No date: Hypertension No date: Interstitial cystitis No date: Interstitial cystitis No date: Prediabetes     Comment:  A1C 6.3 06/14/16 No date: Spinal stenosis No date: UTI (urinary tract infection) No date: UTI (urinary tract infection)    Reproductive/Obstetrics                             Lab Results  Component Value Date   WBC 9.3 09/07/2020   HGB 13.1 09/07/2020   HCT 39.0 09/07/2020   MCV 88.0 09/07/2020   PLT 326 09/07/2020    Anesthesia Physical Anesthesia Plan  ASA: 2  Anesthesia Plan: Spinal   Post-op Pain Management:    Induction:   PONV Risk Score and Plan: 2 and Propofol infusion  Airway Management Planned: Natural Airway  Additional Equipment:   Intra-op Plan:   Post-operative Plan:   Informed Consent: I have reviewed the patients History and Physical, chart, labs and discussed the procedure including the risks, benefits and alternatives for the proposed anesthesia with the patient or authorized representative who has indicated his/her understanding and acceptance.     Dental advisory given  Plan Discussed with: CRNA and Anesthesiologist  Anesthesia Plan Comments:         Anesthesia Quick Evaluation

## 2020-09-16 DIAGNOSIS — M1611 Unilateral primary osteoarthritis, right hip: Secondary | ICD-10-CM | POA: Diagnosis not present

## 2020-09-16 DIAGNOSIS — Z79899 Other long term (current) drug therapy: Secondary | ICD-10-CM | POA: Diagnosis not present

## 2020-09-16 DIAGNOSIS — R7303 Prediabetes: Secondary | ICD-10-CM | POA: Diagnosis not present

## 2020-09-16 DIAGNOSIS — S76312A Strain of muscle, fascia and tendon of the posterior muscle group at thigh level, left thigh, initial encounter: Secondary | ICD-10-CM | POA: Diagnosis not present

## 2020-09-16 DIAGNOSIS — Z96641 Presence of right artificial hip joint: Secondary | ICD-10-CM | POA: Diagnosis not present

## 2020-09-16 DIAGNOSIS — M1712 Unilateral primary osteoarthritis, left knee: Secondary | ICD-10-CM | POA: Diagnosis not present

## 2020-09-16 DIAGNOSIS — I10 Essential (primary) hypertension: Secondary | ICD-10-CM | POA: Diagnosis not present

## 2020-09-16 DIAGNOSIS — E119 Type 2 diabetes mellitus without complications: Secondary | ICD-10-CM | POA: Diagnosis not present

## 2020-09-16 LAB — BASIC METABOLIC PANEL
Anion gap: 6 (ref 5–15)
BUN: 13 mg/dL (ref 8–23)
CO2: 26 mmol/L (ref 22–32)
Calcium: 8.5 mg/dL — ABNORMAL LOW (ref 8.9–10.3)
Chloride: 101 mmol/L (ref 98–111)
Creatinine, Ser: 0.95 mg/dL (ref 0.44–1.00)
GFR, Estimated: >60 mL/min (ref >60)
Glucose, Bld: 140 mg/dL — ABNORMAL HIGH (ref 70–99)
Potassium: 4 mmol/L (ref 3.5–5.1)
Sodium: 133 mmol/L — ABNORMAL LOW (ref 135–145)

## 2020-09-16 LAB — CBC
HCT: 29.2 % — ABNORMAL LOW (ref 36.0–46.0)
Hemoglobin: 10.1 g/dL — ABNORMAL LOW (ref 12.0–15.0)
MCH: 30.1 pg (ref 26.0–34.0)
MCHC: 34.6 g/dL (ref 30.0–36.0)
MCV: 87.2 fL (ref 80.0–100.0)
Platelets: 200 10*3/uL (ref 150–400)
RBC: 3.35 MIL/uL — ABNORMAL LOW (ref 3.87–5.11)
RDW: 12.4 % (ref 11.5–15.5)
WBC: 6.7 10*3/uL (ref 4.0–10.5)
nRBC: 0 % (ref 0.0–0.2)

## 2020-09-16 LAB — SURGICAL PATHOLOGY

## 2020-09-16 NOTE — Progress Notes (Signed)
   Subjective: 1 Day Post-Op Procedure(s) (LRB): TOTAL HIP ARTHROPLASTY ANTERIOR APPROACH (Right) Patient reports pain as severe.   Patient is well, and has had no acute complaints or problems Denies any CP, SOB, ABD pain. We will continue therapy today.  Plan is to go Home after hospital stay.  Objective: Vital signs in last 24 hours: Temp:  [96.8 F (36 C)-98.8 F (37.1 C)] 98.3 F (36.8 C) (08/31 0747) Pulse Rate:  [72-103] 73 (08/31 0747) Resp:  [10-23] 16 (08/31 0747) BP: (83-146)/(17-71) 120/60 (08/31 0747) SpO2:  [94 %-100 %] 96 % (08/31 0747)  Intake/Output from previous day: 08/30 0701 - 08/31 0700 In: 1507.3 [P.O.:240; I.V.:1067.3; IV Piggyback:200] Out: 2580 [Urine:2430; Blood:150] Intake/Output this shift: No intake/output data recorded.  Recent Labs    09/15/20 1149 09/16/20 0437  HGB 10.5* 10.1*   Recent Labs    09/15/20 1149 09/16/20 0437  WBC 11.9* 6.7  RBC 3.67* 3.35*  HCT 31.5* 29.2*  PLT 225 200   Recent Labs    09/15/20 1149 09/16/20 0437  NA  --  133*  K  --  4.0  CL  --  101  CO2  --  26  BUN  --  13  CREATININE 0.99 0.95  GLUCOSE  --  140*  CALCIUM  --  8.5*   No results for input(s): LABPT, INR in the last 72 hours.  EXAM General - Patient is Alert, Appropriate, and Oriented Extremity - Neurovascular intact Sensation intact distally Intact pulses distally Dorsiflexion/Plantar flexion intact Dressing - dressing C/D/I and no drainage, prevena intact with out drainage Motor Function - intact, moving foot and toes well on exam.   Past Medical History:  Diagnosis Date   AKI (acute kidney injury) ([place])    03/2020-04/2020   Allergy    i.e contact allergies [facility]; also plants, trees    Chicken pox    Depression    Diabetes mellitus without complication ([place])    DIET CONTROLLED   Family history of breast cancer    BRCA negative in the past   Family history of breast cancer    Family history of lung  cancer    Family history of rectal cancer    History of prediabetes    Hypertension    Interstitial cystitis    Interstitial cystitis    Prediabetes    A1C 6.3 06/14/16   Spinal stenosis    UTI (urinary tract infection)    UTI (urinary tract infection)     Assessment/Plan:   1 Day Post-Op Procedure(s) (LRB): TOTAL HIP ARTHROPLASTY ANTERIOR APPROACH (Right) Active Problems:   Status post total hip replacement, right  Estimated body mass index is 29.4 kg/m as calculated from the following:   Height as of this encounter: _0  (1.626 m).   Weight as of this encounter: 77.7 kg. Advance diet Up with therapy Work on [employer] and VSS Apply ice pack to left thigh Continue with current pain regimen. Patient has not been getting norco q 4 hrs PRN CM to assist with discharge  DVT Prophylaxis - Lovenox, TED hose, and SCDs Weight-Bearing as tolerated to right leg   T. Rachelle Hora, PA-C [place] 09/16/2020, 8:02 AM

## 2020-09-16 NOTE — Progress Notes (Signed)
Physical Therapy Treatment Patient Details Name: Tabitha Lewis MRN: [id number] DOB: 11-27-1950 Today's Date: 09/16/2020    History of Present Illness Pt is a 70 y.o. female s/p R THA anterior approach 09/15/20.  Pt reports pulling her L hamstring July 27th 2022 (heard it pop)--got it checked out and pt/daughter report x-rays were negative for fx.  PMH includes AKI, anxiety, htn, DM, fx sx of arm (as a child).    PT Comments    Pt was sitting on commode finishing OT upon arriving. She is A and O x 4 and motivated for PT session. She stood with min assist to RW. Constant vcs for hand placement and technique. Pt is slightly anxious throughout session but was educated on expectations going forward. Will change recs from SNF to HHPT. She has 24/7 assistance at home and is progressing well. Will progress ambulation and perform stairs in AM session. Pt was in bed with bed alarm in place and daughter at bedside post PT session.   Follow Up Recommendations  Home health PT     Equipment Recommendations  Rolling walker with 5" wheels;3in1 (PT)       Precautions / Restrictions Precautions Precautions: Fall;Anterior Hip Precaution Booklet Issued: Yes (comment) Precaution Comments: wound vac Restrictions Weight Bearing Restrictions: Yes RLE Weight Bearing: Weight bearing as tolerated Other Position/Activity Restrictions: reports L hamstring injury july 27th    Mobility  Bed Mobility Overal bed mobility: Needs Assistance Bed Mobility: Sit to Supine     Supine to sit: Min assist;Mod assist     General bed mobility comments: increased time + vcs for hip precautions and technique improvements    Transfers Overall transfer level: Needs assistance Equipment used: Rolling walker (2 wheeled) Transfers: Sit to/from Stand Sit to Stand: Min assist         General transfer comment: Min assist to stand from [facility]. required vcs for handplacement and  technique  Ambulation/Gait Ambulation/Gait assistance: Min guard Gait Distance (Feet): 80 Feet Assistive device: Rolling walker (2 wheeled) Gait Pattern/deviations: Antalgic Gait velocity: decreased   General Gait Details: Pt was able to ambulate ~ 80 ft with RW without LOB. antalgic step to pattern however steady without LOB or safety concern. Pt limited by pain/fatigue      Balance Overall balance assessment: Needs assistance Sitting-balance support: No upper extremity supported;Feet supported Sitting balance-Leahy Scale: Good Sitting balance - Comments: steady sitting reaching within BOS   Standing balance support: Bilateral upper extremity supported;During functional activity Standing balance-Leahy Scale: Good Standing balance comment: slow antalgic but steady without LOB or unsteadiness with UE support. static stood without UE support        Cognition Arousal/Alertness: Awake/alert Behavior During Therapy: Anxious;WFL for tasks assessed/performed Overall Cognitive Status: Within Functional Limits for tasks assessed        General Comments: Pt is A and O x 4 and cooperative and motivated throughout. distance limited by pain/fatigue with ambulation             Pertinent Vitals/Pain Pain Assessment: 0-10 Pain Score: 6  Pain Location: R hip/thigh Pain Descriptors / Indicators: Tender;Sore;Grimacing Pain Intervention(s): Limited activity within patient's tolerance;Monitored during session;Premedicated before session;Repositioned    Home Living Family/patient expects to be discharged to:: Private residence Living Arrangements: Alone Available Help at Discharge: Family;Available 24 hours/day Type of Home: House Home Access: Stairs to enter Entrance Stairs-Rails: Right;Left;Can reach both Home Layout: Two level;Able to live on main level with bedroom/bathroom Home Equipment: Grab bars - tub/shower;Walker - 4 wheels;Cane -  single point      Prior Function Level of  Independence: Independent with assistive device(s)      Comments: Ambulating with RW since pulling her L hamstring July 27th 2022; prior to that was ambulating with SPC.  Family able to come in/out to help.  Pt's daughter able to assist during the day (plans to work from home initially) and pt's 46  y.o. grandson plans to stay the night to assist.   PT Goals (current goals can now be found in the care plan section) Acute Rehab PT Goals Patient Stated Goal: to move better and go home Progress towards PT goals: Progressing toward goals    Frequency    BID      PT Plan Discharge plan needs to be updated       AM-PAC PT "6 Clicks" Mobility   Outcome Measure  Help needed turning from your back to your side while in a flat bed without using bedrails?: A Little Help needed moving from lying on your back to sitting on the side of a flat bed without using bedrails?: A Little Help needed moving to and from a bed to a chair (including a wheelchair)?: A Little Help needed standing up from a chair using your arms (e.g., wheelchair or bedside chair)?: A Little Help needed to walk in hospital room?: A Little Help needed climbing 3-5 steps with a railing? : A Lot 6 Click Score: 17    End of Session Equipment Utilized During Treatment: Gait belt Activity Tolerance: Patient tolerated treatment well;Patient limited by pain;Patient limited by fatigue Patient left: in bed;with call bell/phone within reach;with bed alarm set;with nursing/sitter in room;with family/visitor present;with SCD's reapplied;Other (comment) Nurse Communication: Mobility status;Patient requests pain meds PT Visit Diagnosis: Other abnormalities of gait and mobility (R26.89);Muscle weakness (generalized) (M62.81);Difficulty in walking, not elsewhere classified (R26.2);Pain Pain - Right/Left: Right Pain - part of body: Hip     Time: 4098-1191 PT Time Calculation (min) (ACUTE ONLY): 23 min  Charges:  $Gait Training: 8-22  mins $Therapeutic Activity: 8-22 mins                    Jetta Lout PTA 09/16/20, 4:34 PM

## 2020-09-16 NOTE — Plan of Care (Signed)

## 2020-09-16 NOTE — Evaluation (Signed)
Occupational Therapy Evaluation Patient Details Name: Tabitha Lewis MRN: [id number] DOB: 1950-12-06 Today's Date: 09/16/2020    History of Present Illness Pt is a 70 y.o. female s/p R THA anterior approach 09/15/20.  Pt reports pulling her L hamstring July 27th 2022 (heard it pop)--got it checked out and pt/daughter report x-rays were negative for fx.  PMH includes AKI, anxiety, htn, DM, fx sx of arm (as a child).   Clinical Impression   Patient presenting with decreased I in self care, balance, functional mobility/transfers, endurance, and safety awareness. Patient reports living at home alone and independent with use of RW PTA. Pt has assistance form daughter and 74 y/o grandson at discharge. Her greatest barrier is steps to enter her home. Pt performs mobility and self care tasks with increased time. Mod A for bed mobility and min A for sit <>stand. Min guard progressing to supervision for ambulating to bathroom. Pt able to performing hygiene and clothing management with min guard for toileting.  Patient will benefit from acute OT to increase overall independence in the areas of ADLs, functional mobility, and safety awareness in order to safely discharge to next venue of care.     Follow Up Recommendations  Home health OT;Supervision - Intermittent    Equipment Recommendations  3 in 1 bedside commode       Precautions / Restrictions Precautions Precautions: Fall;Anterior Hip Precaution Comments: wound vac Restrictions Weight Bearing Restrictions: Yes RLE Weight Bearing: Weight bearing as tolerated Other Position/Activity Restrictions: pt reports "pulling her L hamstring" July 27th 2022 and has had pain since      Mobility Bed Mobility Overal bed mobility: Needs Assistance Bed Mobility: Supine to Sit     Supine to sit: Mod assist;HOB elevated     General bed mobility comments: vc's for technique; use of bed rails; assist for R>L LE; assist for trunk     Transfers Overall transfer level: Needs assistance Equipment used: Rolling walker (2 wheeled) Transfers: Sit to/from Stand Sit to Stand: Min assist         General transfer comment: from standard height    Balance Overall balance assessment: Needs assistance Sitting-balance support: No upper extremity supported;Feet supported Sitting balance-Leahy Scale: Good Sitting balance - Comments: steady sitting reaching within BOS   Standing balance support: Single extremity supported Standing balance-Leahy Scale: Fair Standing balance comment: pt requiring at least single UE support for static standing balance                           ADL either performed or assessed with clinical judgement   ADL Overall ADL's : Needs assistance/impaired     Grooming: Wash/dry hands;Wash/dry face;Sitting;Set up                   Toilet Transfer: Min guard;Comfort height toilet;RW   Toileting- Architect and Hygiene: Min guard               Vision Baseline Vision/History: 1 Wears glasses Patient Visual Report: No change from baseline              Pertinent Vitals/Pain Pain Assessment: 0-10 Pain Score: 8  Pain Location: R hip/thigh Pain Descriptors / Indicators: Tender;Sore;Grimacing Pain Intervention(s): Monitored during session;Premedicated before session;Repositioned     Hand Dominance Right   Extremity/Trunk Assessment Upper Extremity Assessment Upper Extremity Assessment: Overall WFL for tasks assessed   Lower Extremity Assessment Lower Extremity Assessment: Defer to PT evaluation  Communication Communication Communication: No difficulties   Cognition Arousal/Alertness: Awake/alert Behavior During Therapy: Anxious Overall Cognitive Status: Within Functional Limits for tasks assessed                                                Home Living Family/patient expects to be discharged to:: Private  residence Living Arrangements: Alone Available Help at Discharge: Family;Available 24 hours/day Type of Home: House Home Access: Stairs to enter [employer] of Steps: 5 Entrance Stairs-Rails: Right;Left;Can reach both Home Layout: Two level;Able to live on main level with bedroom/bathroom     Bathroom Shower/Tub: Tub/shower unit   Bathroom Toilet: Handicapped height     Home Equipment: Grab bars - tub/shower;Walker - 4 wheels;Cane - single point          Prior Functioning/Environment Level of Independence: Independent with assistive device(s)        Comments: Ambulating with RW since pulling her L hamstring July 27th 2022; prior to that was ambulating with SPC.  Family able to come in/out to help.  Pt's daughter able to assist during the day (plans to work from home initially) and pt's 24  y.o. grandson plans to stay the night to assist.        OT Problem List: Decreased strength;Pain;Decreased range of motion;Impaired sensation;Decreased activity tolerance;Decreased safety awareness;Impaired balance (sitting and/or standing);Decreased knowledge of precautions      OT Treatment/Interventions: Self-care/ADL training;Therapeutic exercise;Therapeutic activities;Energy conservation;DME and/or AE instruction;Patient/family education;Balance training;Manual therapy    OT Goals(Current goals can be found in the care plan section) Acute Rehab OT Goals Patient Stated Goal: to move better and go home OT Goal Formulation: With patient Time For Goal Achievement: 09/30/20 Potential to Achieve Goals: Good  OT Frequency: Min 2X/week   Barriers to D/C:    none known at this time          AM-PAC OT "6 Clicks" Daily Activity     Outcome Measure Help from another person eating meals?: None Help from another person taking care of personal grooming?: None Help from another person toileting, which includes using toliet, bedpan, or urinal?: A Little Help from another person  bathing (including washing, rinsing, drying)?: A Little Help from another person to put on and taking off regular upper body clothing?: None Help from another person to put on and taking off regular lower body clothing?: A Little 6 Click Score: 21   End of Session Equipment Utilized During Treatment: Rolling walker;Other (comment) (wound vac)  Activity Tolerance: Patient tolerated treatment well Patient left: Other (comment) (seated on commode with PT arriving for hand off)  OT Visit Diagnosis: Unsteadiness on feet (R26.81);Muscle weakness (generalized) (M62.81);Pain Pain - Right/Left: Right Pain - part of body: Hip                Time: 0930-1009 OT Time Calculation (min): 39 min Charges:  OT Evaluation $OT Eval Moderate Complexity: 1 Mod OT Treatments $Self Care/Home Management : 23-37 mins  Jackquline Denmark, MS, OTR/L , CBIS ascom [phone]  09/16/20, 1:00 PM

## 2020-09-16 NOTE — Anesthesia Postprocedure Evaluation (Signed)
Anesthesia Post Note  Patient: Tabitha Lewis  Procedure(s) Performed: TOTAL HIP ARTHROPLASTY ANTERIOR APPROACH (Right: Hip)  Patient location during evaluation: PACU Anesthesia Type: Spinal Level of consciousness: oriented and awake and alert Pain management: pain level controlled Vital Signs Assessment: post-procedure vital signs reviewed and stable Respiratory status: spontaneous breathing, respiratory function stable and patient connected to nasal cannula oxygen Cardiovascular status: blood pressure returned to baseline and stable Postop Assessment: no headache, no backache and no apparent nausea or vomiting Anesthetic complications: no   No notable events documented.   Last Vitals:  Vitals:   09/16/20 0322 09/16/20 0747  BP: (!) 115/51 120/60  Pulse: 75 73  Resp: 16 16  Temp: 37.1 C 36.8 C  SpO2: 94% 96%    Last Pain:  Vitals:   09/16/20 0900  TempSrc:   PainSc: 7                  Malachi Pro C

## 2020-09-16 NOTE — Progress Notes (Signed)
Physical Therapy Treatment Patient Details Name: Tabitha Lewis MRN: [id number] DOB: [birth year] Today's Date: 09/16/2020    History of Present Illness Pt is a 70 y.o. female s/p R THA anterior approach 09/15/20.  Pt reports pulling her L hamstring July 27th 2022 (heard it pop)--got it checked out and pt/daughter report x-rays were negative for fx.  PMH includes AKI, anxiety, htn, DM, fx sx of arm (as a child).    PT Comments    Pt was pleasant and motivated to participate during the session and put forth good effort throughout.  Pt required min A and the use of the bed rail during bed mobility training and reported feeling as if she was able to exert more force with less pain while moving her RLE with PT assistance. Pt required cuing for proper sequencing with transfers and gait but was steady throughout.  Pt will benefit from HHPT upon discharge to safely address deficits listed in patient problem list for decreased caregiver assistance and eventual return to PLOF.     Follow Up Recommendations  Home health PT;Supervision for mobility/OOB     Equipment Recommendations  Rolling walker with 5" wheels;3in1 (PT);Other (comment) (Bed rail and armrest frame for existing elevated toilet)    Recommendations for Other Services       Precautions / Restrictions Precautions Precautions: Fall;Anterior Hip Precaution Booklet Issued: Yes (comment) Precaution Comments: wound vac Restrictions Weight Bearing Restrictions: Yes RLE Weight Bearing: Weight bearing as tolerated Other Position/Activity Restrictions: reports L hamstring injury july 27th    Mobility  Bed Mobility Overal bed mobility: Needs Assistance Bed Mobility: Sit to Supine;Supine to Sit     Supine to sit: Min assist Sit to supine: Min assist   General bed mobility comments: Min A to manage the RLE with pt use of bed rail and extra time/effort    Transfers Overall transfer level: Needs assistance Equipment used:  Rolling walker (2 wheeled) Transfers: Sit to/from Stand Sit to Stand: Min guard         General transfer comment: Min verbal cues for sequencing most notably for increased trunk flexion with pt demonstating fair to good eccentric and concentric control with proper sequencing  Ambulation/Gait Ambulation/Gait assistance: Min guard Gait Distance (Feet): 100 Feet Assistive device: Rolling walker (2 wheeled) Gait Pattern/deviations: Antalgic;Step-to pattern;Step-through pattern;Decreased stance time - right Gait velocity: decreased   General Gait Details: Mostly step-to pattern with pt beginning to initiate step-through pattern near the end of the session with cues for sequencing; min to mod verbal and visual cues for general sequencing with the walker as well decreased UE WB during step-to pattern; very slow cadence with short step length but steady without LOB   Stairs             Wheelchair Mobility    Modified Rankin (Stroke Patients Only)       Balance Overall balance assessment: Needs assistance Sitting-balance support: No upper extremity supported;Feet supported Sitting balance-Leahy Scale: Good     Standing balance support: Bilateral upper extremity supported;During functional activity Standing balance-Leahy Scale: Good Standing balance comment: slow antalgic but steady without LOB or unsteadiness with UE support. static stood without UE support                            Cognition Arousal/Alertness: Awake/alert Behavior During Therapy: WFL for tasks assessed/performed Overall Cognitive Status: Within Functional Limits for tasks assessed  General Comments: Pt is A and O x 4 and cooperative and motivated throughout. distance limited by pain/fatigue with ambulation      Exercises Total Joint Exercises Hip ABduction/ADduction: AAROM;Strengthening;Right;Supine;5 reps Straight Leg Raises:  AAROM;Strengthening;Right;5 reps Marching in Standing: AROM;Strengthening;Both;5 reps;Standing Other Exercises Other Exercises: Bed mobility training and transfer training from various surfaces with pt and daughter Other Exercises: Pt/daughter education on prognosis/general expectations for recovery and progress    General Comments        Pertinent Vitals/Pain Pain Assessment: 0-10 Pain Score: 5  Pain Location: R hip/thigh Pain Descriptors / Indicators: Sore Pain Intervention(s): Repositioned;Premedicated before session;Monitored during session    Home Living                      Prior Function            PT Goals (current goals can now be found in the care plan section) Acute Rehab PT Goals Patient Stated Goal: to move better and go home Progress towards PT goals: Progressing toward goals    Frequency    BID      PT Plan Current plan remains appropriate    Co-evaluation              AM-PAC PT "6 Clicks" Mobility   Outcome Measure  Help needed turning from your back to your side while in a flat bed without using bedrails?: A Little Help needed moving from lying on your back to sitting on the side of a flat bed without using bedrails?: A Little Help needed moving to and from a bed to a chair (including a wheelchair)?: A Little Help needed standing up from a chair using your arms (e.g., wheelchair or bedside chair)?: A Little Help needed to walk in hospital room?: A Little Help needed climbing 3-5 steps with a railing? : A Lot 6 Click Score: 17    End of Session Equipment Utilized During Treatment: Gait belt Activity Tolerance: Patient tolerated treatment well Patient left: in bed;with call bell/phone within reach;with bed alarm set;with family/visitor present;with SCD's reapplied Nurse Communication: Mobility status PT Visit Diagnosis: Other abnormalities of gait and mobility (R26.89);Muscle weakness (generalized) (M62.81);Difficulty in walking,  not elsewhere classified (R26.2);Pain Pain - Right/Left: Right Pain - part of body: Hip     Time: 1287-8676 PT Time Calculation (min) (ACUTE ONLY): 43 min  Charges:  $Gait Training: 23-37 mins $Therapeutic Activity: 8-22 mins                     D. Scott Kimberli Winne PT, DPT 09/16/20, 5:29 PM

## 2020-09-16 NOTE — Progress Notes (Signed)
Met with the patient to discuss DC plan and needs She lives at home alone but has family that will be staying with her and will be helping, She has transportation and can afford her medication She has a Rollator and a high rose toilet but needs a rolling walker and a 3 in 1 I notified Rhonda at [place] and it will be brought into the room prior to DC She is set up with [place] for [facility] services

## 2020-09-17 DIAGNOSIS — I10 Essential (primary) hypertension: Secondary | ICD-10-CM | POA: Diagnosis not present

## 2020-09-17 DIAGNOSIS — E119 Type 2 diabetes mellitus without complications: Secondary | ICD-10-CM | POA: Diagnosis not present

## 2020-09-17 DIAGNOSIS — M1611 Unilateral primary osteoarthritis, right hip: Secondary | ICD-10-CM | POA: Diagnosis not present

## 2020-09-17 DIAGNOSIS — Z79899 Other long term (current) drug therapy: Secondary | ICD-10-CM | POA: Diagnosis not present

## 2020-09-17 DIAGNOSIS — M1712 Unilateral primary osteoarthritis, left knee: Secondary | ICD-10-CM | POA: Diagnosis not present

## 2020-09-17 LAB — CBC
HCT: 28.3 % — ABNORMAL LOW (ref 36.0–46.0)
Hemoglobin: 9.5 g/dL — ABNORMAL LOW (ref 12.0–15.0)
MCH: 28.7 pg (ref 26.0–34.0)
MCHC: 33.6 g/dL (ref 30.0–36.0)
MCV: 85.5 fL (ref 80.0–100.0)
Platelets: 195 10*3/uL (ref 150–400)
RBC: 3.31 MIL/uL — ABNORMAL LOW (ref 3.87–5.11)
RDW: 12.4 % (ref 11.5–15.5)
WBC: 7.3 10*3/uL (ref 4.0–10.5)
nRBC: 0 % (ref 0.0–0.2)

## 2020-09-17 MED ORDER — HYDROCODONE-ACETAMINOPHEN 5-325 MG PO TABS: 1.0000 | ORAL_TABLET | ORAL | 0 refills | Status: DC | PRN

## 2020-09-17 MED ORDER — BISACODYL 10 MG RE SUPP
10.0000 mg | Freq: Once | RECTAL | Status: AC
  Administered 2020-09-17: 10 mg via RECTAL
  Filled 2020-09-17: qty 1

## 2020-09-17 MED ORDER — TRAMADOL HCL 50 MG PO TABS: 50.0000 mg | ORAL_TABLET | Freq: Four times a day (QID) | ORAL | 0 refills | Status: DC | PRN

## 2020-09-17 MED ORDER — ENOXAPARIN SODIUM 40 MG/0.4ML IJ SOSY: 40.0000 mg | PREFILLED_SYRINGE | INTRAMUSCULAR | 0 refills | Status: DC

## 2020-09-17 MED ORDER — MAGNESIUM HYDROXIDE 400 MG/5ML PO SUSP
30.0000 mL | Freq: Once | ORAL | Status: AC
  Administered 2020-09-17: 30 mL via ORAL
  Filled 2020-09-17: qty 30

## 2020-09-17 NOTE — TOC Progression Note (Addendum)
Transition of Care [facility]) - Progression Note    Patient Details  Name: Tabitha Lewis MRN: [id number] Date of Birth: July 24, 1950  [facility] CM/SW Contact  Barrie Dunker, RN Phone Number: 09/17/2020, 8:54 AM  Clinical Narrative:   Sent request to [employer] to check and see if arm rest frame for elevated toilet and bed rail is covered under insurance, neither item is covered under insurance, I let the patient and her daughter know, suggested if they need a bed rail to check in the children's section at a local department store for the kind that fit between the bed mattress and box spring. Also any medical supply store may have the items they are wanting, CM answered all of the questions and reiterated how Home health works and how often it is expected that they come   Expected Discharge Plan: Home w Home Health Services Barriers to Discharge: Continued Medical Work up  Expected Discharge Plan and Services Expected Discharge Plan: Home w Home Health Services   Discharge Planning Services: CM Consult   Living arrangements for the past 2 months: Single Family Home                 DME Arranged: Walker rolling, 3-N-1 DME Agency: [employer] Date DME Agency Contacted: 09/16/20 Time DME Agency Contacted: 1024 Representative spoke with at DME Agency: [facility] HH Arranged: PT, OT HH Agency: [facility] Date [facility] Agency Contacted: 09/16/20 Time HH Agency Contacted: [phone] Representative spoke with at [facility] Agency: [place]   Social Determinants of Health (SDOH) Interventions    Readmission Risk Interventions No flowsheet data found.

## 2020-09-17 NOTE — Discharge Summary (Signed)
Physician Discharge Summary  Patient ID: Tabitha Lewis MRN: [id number] DOB/AGE: 1950/06/03 70 y.o.  Admit date: 09/15/2020 Discharge date: 09/17/2020  Admission Diagnoses:  Status post total hip replacement, right [Z96.641]   Discharge Diagnoses: Patient Active Problem List   Diagnosis Date Noted   Status post total hip replacement, right 09/15/2020   Prediabetes 08/24/2020   Arthritis of right hip 08/21/2020   Arthritis of left knee 08/21/2020   Arthritis of left hip 08/21/2020   Tear of left hamstring 08/21/2020   Fatty liver 05/11/2020   AKI (acute kidney injury) ([place]) 05/11/2020   Hypertension associated with diabetes ([place]) 03/26/2020   Abnormal MRI, lumbar spine 02/21/2020   Scoliosis of lumbar spine 02/21/2020   Lumbar facet arthropathy 02/21/2020   Cervicalgia 02/21/2020   Annual physical exam 02/21/2020   History of cataract extraction 02/21/2020   Hypokalemia 02/21/2020   Arthritis, lumbar spine 09/29/2019   Left hip pain 09/29/2019   Obesity (BMI 30-39.9) 09/25/2019   Genetic testing 08/22/2018   Family history of breast cancer    Family history of rectal cancer    Family history of lung cancer    Vitamin D deficiency 12/26/2017   Acute cystitis without hematuria 06/19/2017   Allergic rhinitis 06/19/2017   Interstitial cystitis 12/15/2016   HTN (hypertension) 12/15/2016   Depression, major, single episode, mild ([place]) 12/15/2016   Anxiety 12/15/2016   Contact dermatitis 12/15/2016    Past Medical History:  Diagnosis Date   AKI (acute kidney injury) ([place])    03/2020-04/2020   Allergy    i.e contact allergies [facility]; also plants, trees    Chicken pox    Depression    Diabetes mellitus without complication ([place])    DIET CONTROLLED   Family history of breast cancer    BRCA negative in the past   Family history of breast cancer    Family history of lung cancer    Family history of rectal cancer    History of prediabetes     Hypertension    Interstitial cystitis    Interstitial cystitis    Prediabetes    A1C 6.3 06/14/16   Spinal stenosis    UTI (urinary tract infection)    UTI (urinary tract infection)      Transfusion: none   Consultants (if any):   Discharged Condition: Improved  Hospital Course: Tabitha Lewis is an 70 y.o. female who was admitted 09/15/2020 with a diagnosis of right hip osteoarthritis and went to the operating room on 09/15/2020 and underwent the above named procedures.    Surgeries: Procedure(s): TOTAL HIP ARTHROPLASTY ANTERIOR APPROACH on 09/15/2020 Patient tolerated the surgery well. Taken to PACU where she was stabilized and then transferred to the orthopedic floor.  Started on Lovenox 40 mg q 24 hrs. Foot pumps applied bilaterally at 80 mm. Heels elevated on bed with rolled towels. No evidence of DVT. Negative [place]. Physical therapy started on day #1 for gait training and transfer. OT started day #1 for ADL and assisted devices.  Patient's foley was d/c on day #1. Patient's IV was d/c on day #2.  On post op day #2 patient was stable and ready for discharge to home with HHPT.    She was given perioperative antibiotics:  Anti-infectives (From admission, onward)    Start     Dose/Rate Route Frequency Ordered Stop   09/15/20 1330  ceFAZolin (ANCEF) IVPB 2g/100 mL premix        2 g 200 mL/hr  over 30 Minutes Intravenous Every 6 hours 09/15/20 1123 09/15/20 2030   09/15/20 0611  ceFAZolin (ANCEF) 2-4 GM/100ML-% IVPB       Note to Pharmacy: [facility], [facility]: cabinet override      09/15/20 0611 09/15/20 0752   09/15/20 0600  ceFAZolin (ANCEF) IVPB 2g/100 mL premix        2 g 200 mL/hr over 30 Minutes Intravenous On call to O.R. 09/14/20 2347 09/15/20 0740     .  She was given sequential compression devices, early ambulation, and lovenox for DVT prophylaxis.  She benefited maximally from the hospital stay and there were no complications.    Recent vital signs:   Vitals:   09/17/20 0223 09/17/20 0837  BP: (!) 121/58 128/68  Pulse: 80 74  Resp: 16 16  Temp: 98.7 F (37.1 C) 98.5 F (36.9 C)  SpO2: 95% 96%    Recent laboratory studies:  Lab Results  Component Value Date   HGB 9.5 (L) 09/17/2020   HGB 10.1 (L) 09/16/2020   HGB 10.5 (L) 09/15/2020   Lab Results  Component Value Date   WBC 7.3 09/17/2020   PLT 195 09/17/2020   No results found for: INR Lab Results  Component Value Date   NA 133 (L) 09/16/2020   K 4.0 09/16/2020   CL 101 09/16/2020   CO2 26 09/16/2020   BUN 13 09/16/2020   CREATININE 0.95 09/16/2020   GLUCOSE 140 (H) 09/16/2020    Discharge Medications:   Allergies as of 09/17/2020       Reactions   Amlodipine Other (See Comments)   Leg swelling max dose  Leg swelling max dose    Nsaids    AKI    Benzalkonium Chloride Rash   Cetyl Alcohol Rash   Codeine Rash   PT STATES HAS TAKEN THIS MED WITHOUT PROBLEMS   Lanolin Rash   Neomycin Rash   Nickel Rash   Propylene Glycol Rash        Medication List     STOP taking these medications    nebivolol 2.5 MG tablet Commonly known as: BYSTOLIC       TAKE these medications    acetaminophen 500 MG tablet Commonly known as: TYLENOL Take 1,000 mg by mouth every 6 (six) hours as needed.   Azelastine-Fluticasone 137-50 MCG/ACT Susp 1 spray bid   bisacodyl 5 MG EC tablet Generic drug: bisacodyl Take 5 mg by mouth daily as needed for moderate constipation. TAKE 1-3 AS NEEDED   cetirizine 10 MG tablet Commonly known as: ZYRTEC Take by mouth.   cyanocobalamin 1000 MCG tablet Take 1,000 mcg by mouth daily.   docusate sodium 100 MG capsule Commonly known as: COLACE Take 100 mg by mouth 2 (two) times daily.   enoxaparin 40 MG/0.4ML injection Commonly known as: LOVENOX Inject 0.4 mLs (40 mg total) into the skin daily for 14 days. Start taking on: September 18, 2020   HYDROcodone-acetaminophen 5-325 MG tablet Commonly known as:  NORCO/VICODIN Take 1-2 tablets by mouth every 4 (four) hours as needed for moderate pain (pain score 4-6).   lisinopril-hydrochlorothiazide 20-12.5 MG tablet Commonly known as: Zestoretic Take 1 tablet by mouth daily. In am if BP too low <90<60 cut pill in 1/2   multivitamin capsule Take 1 capsule by mouth daily.   tiZANidine 2 MG tablet Commonly known as: ZANAFLEX Take by mouth every 6 (six) hours as needed for muscle spasms.   traMADol 50 MG tablet Commonly known as: Veatrice Bourbon  Take 1 tablet (50 mg total) by mouth every 6 (six) hours as needed. What changed: when to take this   traZODone 100 MG tablet Commonly known as: DESYREL TAKE 1 TABLET (100 MG TOTAL) BY MOUTH AT BEDTIME AS NEEDED FOR SLEEP.   VITAMIN D3 PO Take 2,000 Units by mouth.               Durable Medical Equipment  (From admission, onward)           Start     Ordered   09/15/20 1124  DME Walker rolling  Once       Question Answer Comment  Walker: With 5 Inch Wheels   Patient needs a walker to treat with the following condition Status post total hip replacement, right      09/15/20 1123   09/15/20 1124  DME 3 n 1  Once        09/15/20 1123   09/15/20 1124  DME Bedside commode  Once       Question:  Patient needs a bedside commode to treat with the following condition  Answer:  Status post total hip replacement, right   09/15/20 1123            Diagnostic Studies: DG HIP OPERATIVE UNILAT W OR W/O PELVIS RIGHT  Result Date: 09/15/2020 CLINICAL DATA:  Right hip replacement EXAM: OPERATIVE RIGHT HIP WITH PELVIS COMPARISON:  None. FLUOROSCOPY TIME:  Radiation Exposure Index (as provided by the fluoroscopic device): 4.4 mGy If the device does not provide the exposure index: Fluoroscopy Time:  12 seconds Number of Acquired Images:  5 FINDINGS: Initial images demonstrate degenerative change of the right hip joint. Subsequent acetabular and femoral filling was performed with placement of prosthesis in  satisfactory position. IMPRESSION: Right hip replacement Electronically Signed   By: Inez Catalina M.D.   On: 09/15/2020 09:50   DG HIP UNILAT W OR W/O PELVIS 2-3 VIEWS RIGHT  Result Date: 09/15/2020 CLINICAL DATA:  Status post right hip replacement EXAM: DG HIP (WITH OR WITHOUT PELVIS) 2-3V RIGHT COMPARISON:  None. FINDINGS: Right hip prosthesis is noted in satisfactory position. Postsurgical changes are noted. No acute bony abnormality is seen. IMPRESSION: Status post right hip replacement. Electronically Signed   By: Inez Catalina M.D.   On: 09/15/2020 09:47    Disposition: Discharge disposition: 06-Home-Health Care Svc         Follow-up Information     Duanne Guess, PA-C Follow up in 2 week(s).   Specialties: Orthopedic Surgery, Emergency Medicine Contact information: [place] [address] [phone]                  Signed: Feliberto Gottron 09/17/2020, 11:06 AM

## 2020-09-17 NOTE — Discharge Instructions (Signed)

## 2020-09-17 NOTE — Plan of Care (Signed)
Patient discharged home per MD orders at this time.All discharge instructions,education and medications reviewed with patient and family at bedside.Pt expressed understanding and will comply with dc instructions.follow up appointments was also communicated to Pt. no verbal c/o or any ssx of distress.Pt discharged home with HH services,PT/OT per order.patient was transported home by son in a private car.

## 2020-09-17 NOTE — Progress Notes (Signed)
   Subjective: 2 Days Post-Op Procedure(s) (LRB): TOTAL HIP ARTHROPLASTY ANTERIOR APPROACH (Right) Patient reports pain as mild.   Patient is well, and has had no acute complaints or problems Denies any CP, SOB, ABD pain. We will continue therapy today.  Plan is to go Home after hospital stay.  Objective: Vital signs in last 24 hours: Temp:  [98.1 F (36.7 C)-99.1 F (37.3 C)] 98.7 F (37.1 C) (09/01 0223) Pulse Rate:  [67-80] 80 (09/01 0223) Resp:  [16] 16 (09/01 0223) BP: (103-122)/(54-58) 121/58 (09/01 0223) SpO2:  [95 %-99 %] 95 % (09/01 0223)  Intake/Output from previous day: No intake/output data recorded. Intake/Output this shift: No intake/output data recorded.  Recent Labs    09/15/20 1149 09/16/20 0437 09/17/20 0403  HGB 10.5* 10.1* 9.5*   Recent Labs    09/16/20 0437 09/17/20 0403  WBC 6.7 7.3  RBC 3.35* 3.31*  HCT 29.2* 28.3*  PLT 200 195   Recent Labs    09/15/20 1149 09/16/20 0437  NA  --  133*  K  --  4.0  CL  --  101  CO2  --  26  BUN  --  13  CREATININE 0.99 0.95  GLUCOSE  --  140*  CALCIUM  --  8.5*   No results for input(s): LABPT, INR in the last 72 hours.  EXAM General - Patient is Alert, Appropriate, and Oriented Extremity - Neurovascular intact Sensation intact distally Intact pulses distally Dorsiflexion/Plantar flexion intact Dressing - dressing C/D/I and no drainage, prevena intact with out drainage Motor Function - intact, moving foot and toes well on exam.   Past Medical History:  Diagnosis Date   AKI (acute kidney injury) ([place])    03/2020-04/2020   Allergy    i.e contact allergies [facility]; also plants, trees    Chicken pox    Depression    Diabetes mellitus without complication ([place])    DIET CONTROLLED   Family history of breast cancer    BRCA negative in the past   Family history of breast cancer    Family history of lung cancer    Family history of rectal cancer    History of  prediabetes    Hypertension    Interstitial cystitis    Interstitial cystitis    Prediabetes    A1C 6.3 06/14/16   Spinal stenosis    UTI (urinary tract infection)    UTI (urinary tract infection)     Assessment/Plan:   2 Days Post-Op Procedure(s) (LRB): TOTAL HIP ARTHROPLASTY ANTERIOR APPROACH (Right) Active Problems:   Status post total hip replacement, right  Estimated body mass index is 29.4 kg/m as calculated from the following:   Height as of this encounter: $RemoveBeforeD'5\' 4"'[id number]$  (1.626 m).   Weight as of this encounter: 77.7 kg. Advance diet Up with therapy Work on [employer] and VSS Apply ice pack to left thigh Pain controlled CM to assist with discharge to home with HHPT today if completion of PT goals  DVT Prophylaxis - Lovenox, TED hose, and SCDs Weight-Bearing as tolerated to right leg   T. Rachelle Hora, PA-C [place] 09/17/2020, 8:37 AM

## 2020-09-17 NOTE — Progress Notes (Signed)
Physical Therapy Treatment Patient Details Name: Tabitha Lewis MRN: [id number] DOB: 1950-10-26 Today's Date: 09/17/2020    History of Present Illness Pt is a 70 y.o. female s/p R THA anterior approach 09/15/20.  Pt reports pulling her L hamstring July 27th 2022 (heard it pop)--got it checked out and pt/daughter report x-rays were negative for fx.  PMH includes AKI, anxiety, htn, DM, fx sx of arm (as a child).    PT Comments     Pt was long sitting in bed upon arriving. She agrees to PT session and is cooperative and motivated. Eager for DC home. Overall she demonstrated improved abilities and has cleared all acute PT goals for safety DC home. Author highly recommend DC home with HHPT to follow to continue to advance pt's independence with all ADLs. Pt will have 24/7 assistance at home.   Follow Up Recommendations  Home health PT;Supervision for mobility/OOB     Equipment Recommendations  Rolling walker with 5" wheels;3in1 (PT)       Precautions / Restrictions Precautions Precautions: Fall;Anterior Hip Precaution Booklet Issued: Yes (comment) Precaution Comments: wound vac Restrictions Weight Bearing Restrictions: Yes RLE Weight Bearing: Weight bearing as tolerated    Mobility  Bed Mobility Overal bed mobility: Needs Assistance Bed Mobility: Supine to Sit;Sit to Supine     Supine to sit: Min assist     General bed mobility comments: Min assist to exit L side of bed. Vcs throughout for sequencing and technique improvements.    Transfers Overall transfer level: Needs assistance Equipment used: Rolling walker (2 wheeled) Transfers: Sit to/from Stand Sit to Stand: Supervision         General transfer comment: pt demonstrated safe ability to STS from EOB, WC and form recliner. no physical assistance required  Ambulation/Gait Ambulation/Gait assistance: Supervision Gait Distance (Feet): 160 Feet Assistive device: Rolling walker (2 wheeled) Gait  Pattern/deviations: Step-through pattern Gait velocity: WNL   General Gait Details: pt demonstrated much improved gait kinematics form previous date. She easily ambulated 160 ft with RW without LOB or safety concern   Stairs Stairs: Yes Stairs assistance: Supervision Stair Management: Two rails;Step to pattern Number of Stairs: 4 General stair comments: pt safely demonstrated ability to ascend/descend stairs without LOB or safety concern       Balance Overall balance assessment: Needs assistance Sitting-balance support: No upper extremity supported;Feet supported Sitting balance-Leahy Scale: Good     Standing balance support: Bilateral upper extremity supported;During functional activity Standing balance-Leahy Scale: Good        Cognition Arousal/Alertness: Awake/alert Behavior During Therapy: WFL for tasks assessed/performed Overall Cognitive Status: Within Functional Limits for tasks assessed      General Comments: Pt is A and O x 4 and cooperative and motivated             Pertinent Vitals/Pain Pain Assessment: 0-10 Pain Score: 4  Pain Location: R hip/thigh Pain Descriptors / Indicators: Sore Pain Intervention(s): Limited activity within patient's tolerance;Monitored during session;Premedicated before session;Repositioned     PT Goals (current goals can now be found in the care plan section) Acute Rehab PT Goals Patient Stated Goal: go home Progress towards PT goals: Progressing toward goals    Frequency    BID      PT Plan Current plan remains appropriate       AM-PAC PT "6 Clicks" Mobility   Outcome Measure  Help needed turning from your back to your side while in a flat bed without using bedrails?: A Little Help  needed moving from lying on your back to sitting on the side of a flat bed without using bedrails?: A Little Help needed moving to and from a bed to a chair (including a wheelchair)?: A Little Help needed standing up from a chair  using your arms (e.g., wheelchair or bedside chair)?: A Little Help needed to walk in hospital room?: A Little Help needed climbing 3-5 steps with a railing? : A Little 6 Click Score: 18    End of Session Equipment Utilized During Treatment: Gait belt Activity Tolerance: Patient tolerated treatment well Patient left: in chair;with call bell/phone within reach;with chair alarm set;with family/visitor present Nurse Communication: Mobility status PT Visit Diagnosis: Other abnormalities of gait and mobility (R26.89);Muscle weakness (generalized) (M62.81);Difficulty in walking, not elsewhere classified (R26.2);Pain Pain - Right/Left: Right Pain - part of body: Hip     Time: 1015-1040 PT Time Calculation (min) (ACUTE ONLY): 25 min  Charges:  $Gait Training: 23-37 mins                     Jetta Lout PTA 09/17/20, 11:36 AM

## 2020-09-18 DIAGNOSIS — I152 Hypertension secondary to endocrine disorders: Secondary | ICD-10-CM | POA: Diagnosis not present

## 2020-09-18 DIAGNOSIS — F320 Major depressive disorder, single episode, mild: Secondary | ICD-10-CM | POA: Diagnosis not present

## 2020-09-18 DIAGNOSIS — K760 Fatty (change of) liver, not elsewhere classified: Secondary | ICD-10-CM | POA: Diagnosis not present

## 2020-09-18 DIAGNOSIS — F419 Anxiety disorder, unspecified: Secondary | ICD-10-CM | POA: Diagnosis not present

## 2020-09-18 DIAGNOSIS — Z471 Aftercare following joint replacement surgery: Secondary | ICD-10-CM | POA: Diagnosis not present

## 2020-09-18 DIAGNOSIS — E1159 Type 2 diabetes mellitus with other circulatory complications: Secondary | ICD-10-CM | POA: Diagnosis not present

## 2020-09-18 DIAGNOSIS — M1612 Unilateral primary osteoarthritis, left hip: Secondary | ICD-10-CM | POA: Diagnosis not present

## 2020-09-18 DIAGNOSIS — M1712 Unilateral primary osteoarthritis, left knee: Secondary | ICD-10-CM | POA: Diagnosis not present

## 2020-09-18 DIAGNOSIS — M542 Cervicalgia: Secondary | ICD-10-CM | POA: Diagnosis not present

## 2020-09-22 DIAGNOSIS — M1612 Unilateral primary osteoarthritis, left hip: Secondary | ICD-10-CM | POA: Diagnosis not present

## 2020-09-22 DIAGNOSIS — Z471 Aftercare following joint replacement surgery: Secondary | ICD-10-CM | POA: Diagnosis not present

## 2020-09-22 DIAGNOSIS — E1159 Type 2 diabetes mellitus with other circulatory complications: Secondary | ICD-10-CM | POA: Diagnosis not present

## 2020-09-22 DIAGNOSIS — M1712 Unilateral primary osteoarthritis, left knee: Secondary | ICD-10-CM | POA: Diagnosis not present

## 2020-09-22 DIAGNOSIS — I152 Hypertension secondary to endocrine disorders: Secondary | ICD-10-CM | POA: Diagnosis not present

## 2020-09-22 DIAGNOSIS — K760 Fatty (change of) liver, not elsewhere classified: Secondary | ICD-10-CM | POA: Diagnosis not present

## 2020-09-22 DIAGNOSIS — M542 Cervicalgia: Secondary | ICD-10-CM | POA: Diagnosis not present

## 2020-09-22 DIAGNOSIS — F419 Anxiety disorder, unspecified: Secondary | ICD-10-CM | POA: Diagnosis not present

## 2020-09-22 DIAGNOSIS — F320 Major depressive disorder, single episode, mild: Secondary | ICD-10-CM | POA: Diagnosis not present

## 2020-09-23 DIAGNOSIS — Z471 Aftercare following joint replacement surgery: Secondary | ICD-10-CM | POA: Diagnosis not present

## 2020-09-23 DIAGNOSIS — M1712 Unilateral primary osteoarthritis, left knee: Secondary | ICD-10-CM | POA: Diagnosis not present

## 2020-09-23 DIAGNOSIS — E1159 Type 2 diabetes mellitus with other circulatory complications: Secondary | ICD-10-CM | POA: Diagnosis not present

## 2020-09-23 DIAGNOSIS — F419 Anxiety disorder, unspecified: Secondary | ICD-10-CM | POA: Diagnosis not present

## 2020-09-23 DIAGNOSIS — M542 Cervicalgia: Secondary | ICD-10-CM | POA: Diagnosis not present

## 2020-09-23 DIAGNOSIS — M1612 Unilateral primary osteoarthritis, left hip: Secondary | ICD-10-CM | POA: Diagnosis not present

## 2020-09-23 DIAGNOSIS — I152 Hypertension secondary to endocrine disorders: Secondary | ICD-10-CM | POA: Diagnosis not present

## 2020-09-23 DIAGNOSIS — K760 Fatty (change of) liver, not elsewhere classified: Secondary | ICD-10-CM | POA: Diagnosis not present

## 2020-09-23 DIAGNOSIS — F320 Major depressive disorder, single episode, mild: Secondary | ICD-10-CM | POA: Diagnosis not present

## 2020-09-24 DIAGNOSIS — K760 Fatty (change of) liver, not elsewhere classified: Secondary | ICD-10-CM | POA: Diagnosis not present

## 2020-09-24 DIAGNOSIS — F320 Major depressive disorder, single episode, mild: Secondary | ICD-10-CM | POA: Diagnosis not present

## 2020-09-24 DIAGNOSIS — E1159 Type 2 diabetes mellitus with other circulatory complications: Secondary | ICD-10-CM | POA: Diagnosis not present

## 2020-09-24 DIAGNOSIS — M542 Cervicalgia: Secondary | ICD-10-CM | POA: Diagnosis not present

## 2020-09-24 DIAGNOSIS — M1712 Unilateral primary osteoarthritis, left knee: Secondary | ICD-10-CM | POA: Diagnosis not present

## 2020-09-24 DIAGNOSIS — M1612 Unilateral primary osteoarthritis, left hip: Secondary | ICD-10-CM | POA: Diagnosis not present

## 2020-09-24 DIAGNOSIS — F419 Anxiety disorder, unspecified: Secondary | ICD-10-CM | POA: Diagnosis not present

## 2020-09-24 DIAGNOSIS — Z471 Aftercare following joint replacement surgery: Secondary | ICD-10-CM | POA: Diagnosis not present

## 2020-09-24 DIAGNOSIS — I152 Hypertension secondary to endocrine disorders: Secondary | ICD-10-CM | POA: Diagnosis not present

## 2020-09-25 DIAGNOSIS — M1612 Unilateral primary osteoarthritis, left hip: Secondary | ICD-10-CM | POA: Diagnosis not present

## 2020-09-25 DIAGNOSIS — K760 Fatty (change of) liver, not elsewhere classified: Secondary | ICD-10-CM | POA: Diagnosis not present

## 2020-09-25 DIAGNOSIS — E1159 Type 2 diabetes mellitus with other circulatory complications: Secondary | ICD-10-CM | POA: Diagnosis not present

## 2020-09-25 DIAGNOSIS — I152 Hypertension secondary to endocrine disorders: Secondary | ICD-10-CM | POA: Diagnosis not present

## 2020-09-25 DIAGNOSIS — F419 Anxiety disorder, unspecified: Secondary | ICD-10-CM | POA: Diagnosis not present

## 2020-09-25 DIAGNOSIS — M542 Cervicalgia: Secondary | ICD-10-CM | POA: Diagnosis not present

## 2020-09-25 DIAGNOSIS — Z471 Aftercare following joint replacement surgery: Secondary | ICD-10-CM | POA: Diagnosis not present

## 2020-09-25 DIAGNOSIS — F320 Major depressive disorder, single episode, mild: Secondary | ICD-10-CM | POA: Diagnosis not present

## 2020-09-25 DIAGNOSIS — M1712 Unilateral primary osteoarthritis, left knee: Secondary | ICD-10-CM | POA: Diagnosis not present

## 2020-09-28 DIAGNOSIS — F419 Anxiety disorder, unspecified: Secondary | ICD-10-CM | POA: Diagnosis not present

## 2020-09-28 DIAGNOSIS — Z471 Aftercare following joint replacement surgery: Secondary | ICD-10-CM | POA: Diagnosis not present

## 2020-09-28 DIAGNOSIS — M1612 Unilateral primary osteoarthritis, left hip: Secondary | ICD-10-CM | POA: Diagnosis not present

## 2020-09-28 DIAGNOSIS — E1159 Type 2 diabetes mellitus with other circulatory complications: Secondary | ICD-10-CM | POA: Diagnosis not present

## 2020-09-28 DIAGNOSIS — M542 Cervicalgia: Secondary | ICD-10-CM | POA: Diagnosis not present

## 2020-09-28 DIAGNOSIS — F320 Major depressive disorder, single episode, mild: Secondary | ICD-10-CM | POA: Diagnosis not present

## 2020-09-28 DIAGNOSIS — M1712 Unilateral primary osteoarthritis, left knee: Secondary | ICD-10-CM | POA: Diagnosis not present

## 2020-09-28 DIAGNOSIS — I152 Hypertension secondary to endocrine disorders: Secondary | ICD-10-CM | POA: Diagnosis not present

## 2020-09-28 DIAGNOSIS — K760 Fatty (change of) liver, not elsewhere classified: Secondary | ICD-10-CM | POA: Diagnosis not present

## 2020-10-22 ENCOUNTER — Ambulatory Visit: Payer: Medicare PPO | Admitting: Podiatry

## 2020-10-28 DIAGNOSIS — M1611 Unilateral primary osteoarthritis, right hip: Secondary | ICD-10-CM | POA: Diagnosis not present

## 2020-10-28 DIAGNOSIS — Z96641 Presence of right artificial hip joint: Secondary | ICD-10-CM | POA: Diagnosis not present

## 2020-11-09 ENCOUNTER — Other Ambulatory Visit: Payer: Self-pay

## 2020-11-09 ENCOUNTER — Ambulatory Visit
Admission: RE | Admit: 2020-11-09 | Discharge: 2020-11-09 | Disposition: A | Discharge status: Home / Self Care | Payer: Medicare PPO | Source: Ambulatory Visit | Attending: Internal Medicine | Admitting: Internal Medicine

## 2020-11-09 DIAGNOSIS — Z1231 Encounter for screening mammogram for malignant neoplasm of breast: Secondary | ICD-10-CM | POA: Insufficient documentation

## 2020-11-09 IMAGING — MG MM DIGITAL SCREENING BILAT W/ TOMO AND CAD
8 series · 8 of 24 positions shown · non-contrast
Comparison: Previous exam(s).

CLINICAL DATA: Screening.

EXAM:
DIGITAL SCREENING BILATERAL MAMMOGRAM WITH TOMOSYNTHESIS AND CAD
TECHNIQUE: Bilateral screening digital craniocaudal and mediolateral oblique
mammograms were obtained. Bilateral screening digital breast
tomosynthesis was performed. The images were evaluated with
computer-aided detection.

[L MLO synth-2D]
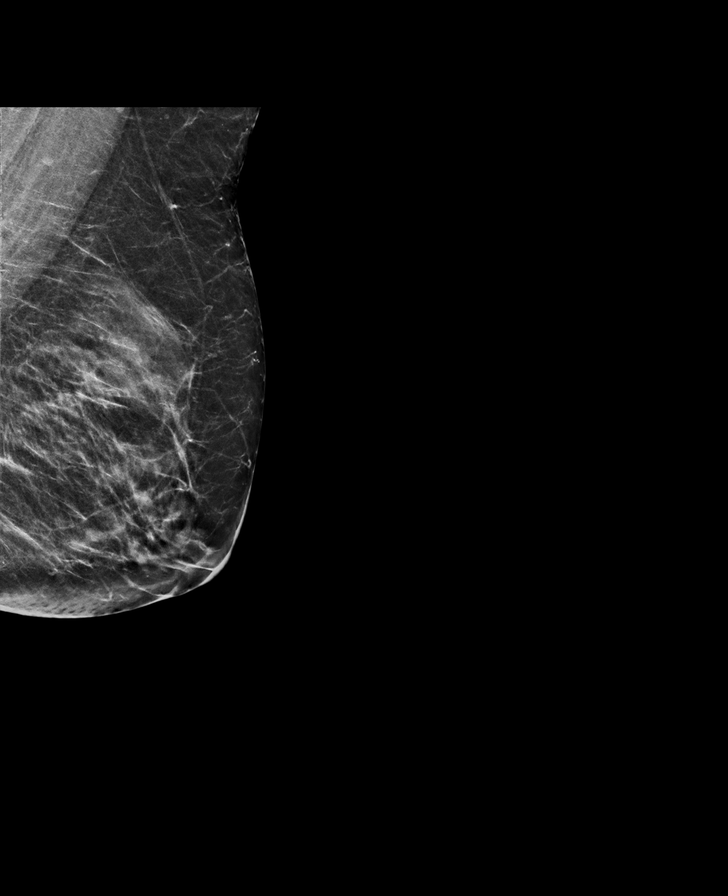

[L CC synth-2D]
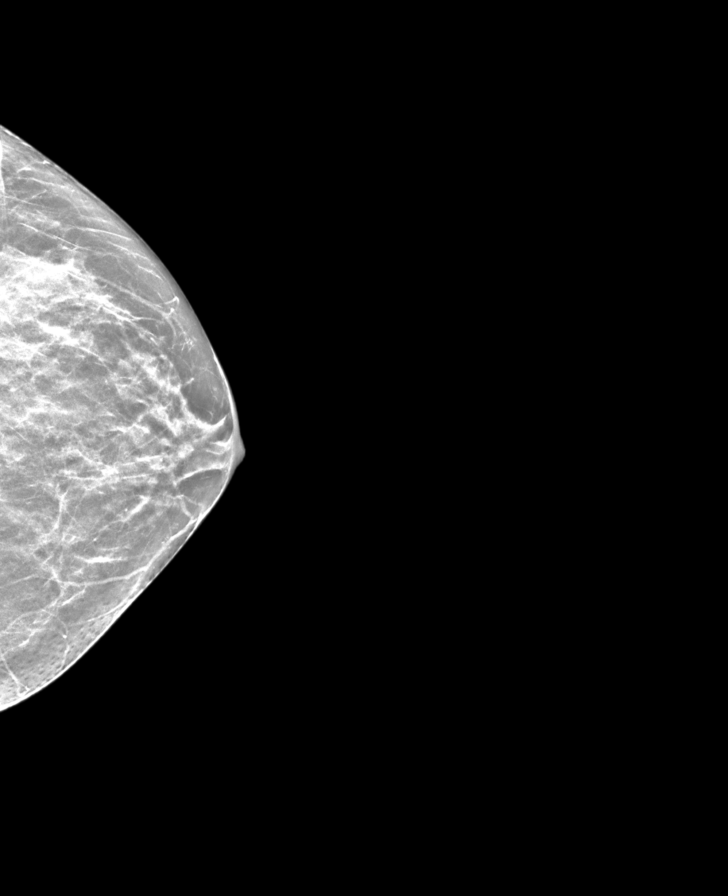

[R MLO synth-2D]
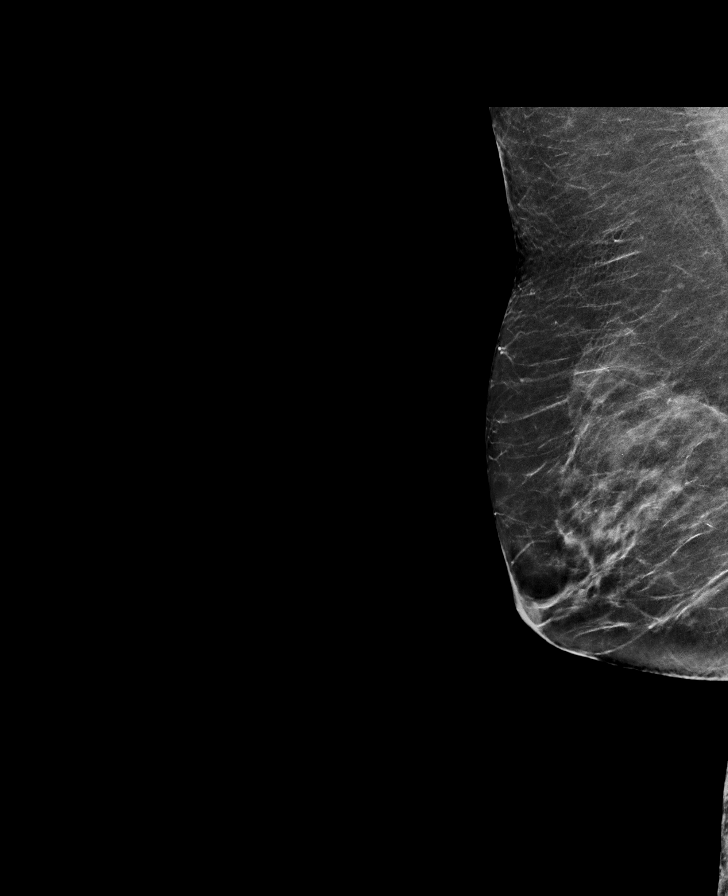

[R CC synth-2D]
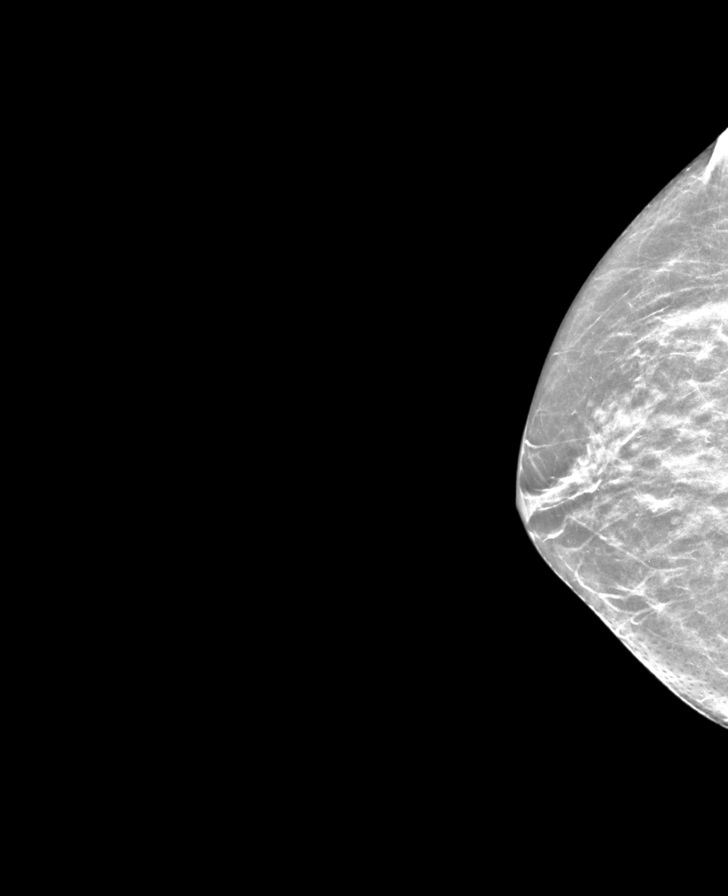

[R CC tomo · tomo slice 23/46.0]
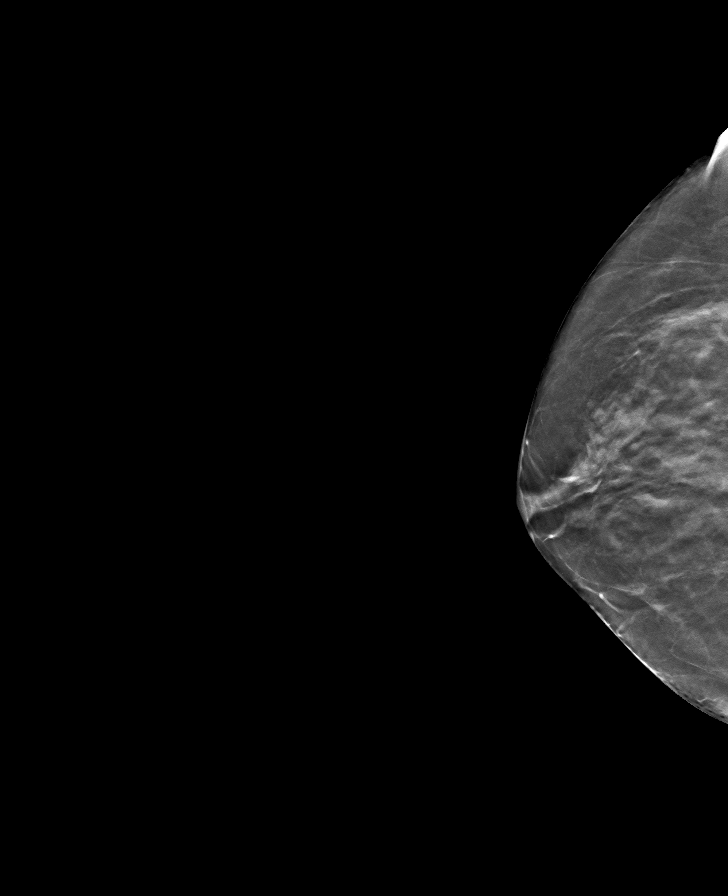

[L MLO tomo · tomo slice 30/59.0]
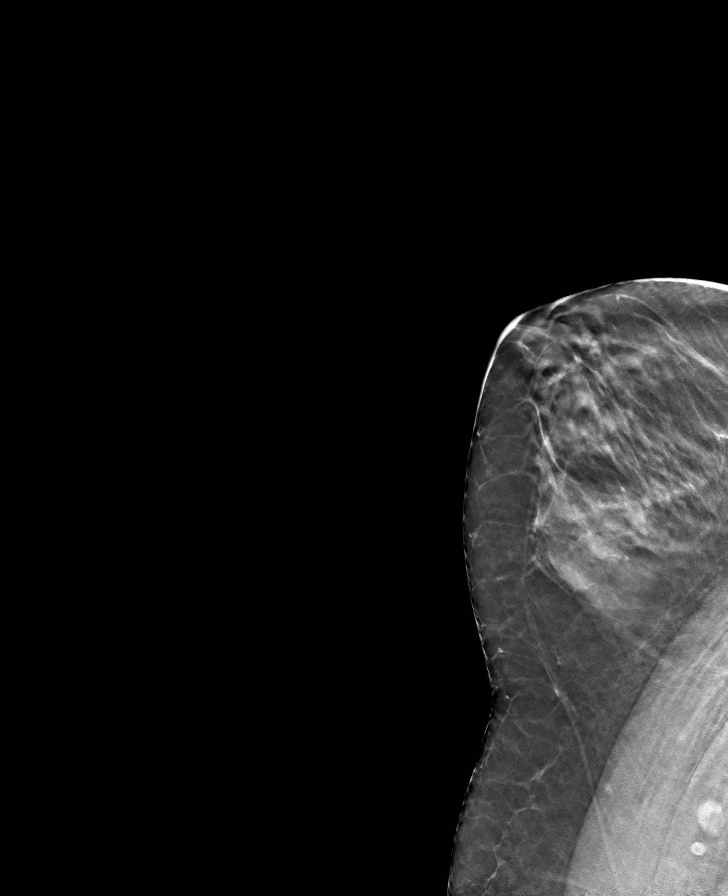

[R MLO tomo · tomo slice 33/66.0]
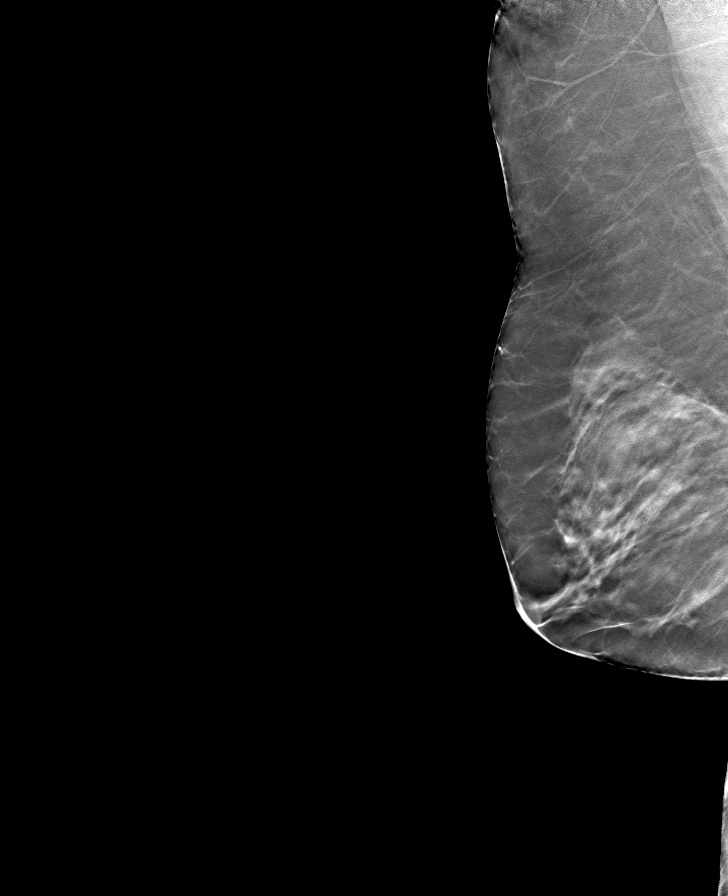

[L CC tomo · tomo slice 25/48.0]
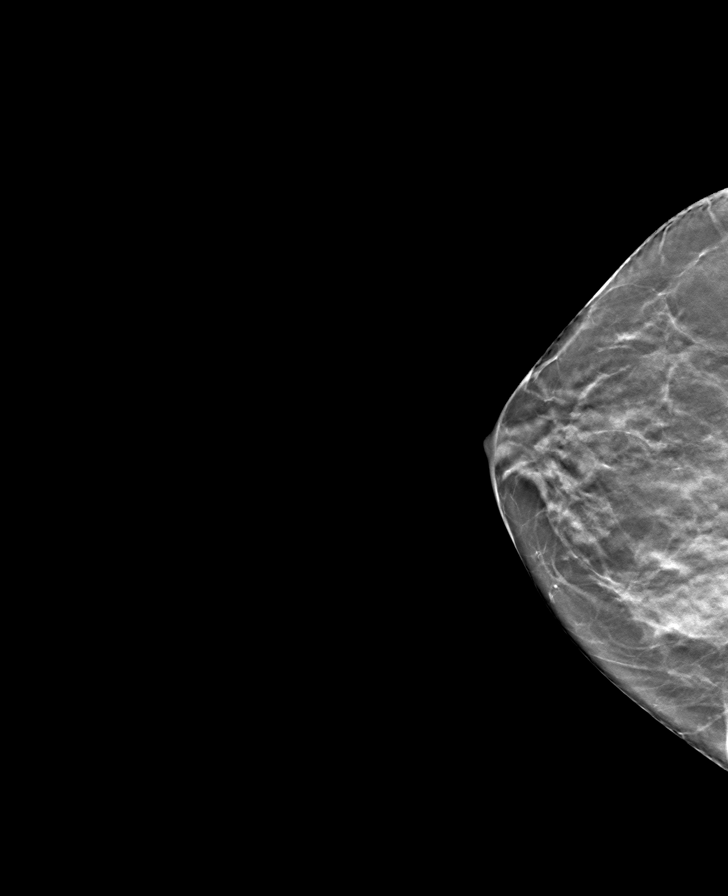

[8 of 24 positions shown; findings below may reference images not displayed]

ACR Breast Density Category c: The breast tissue is heterogeneously
dense, which may obscure small masses.
FINDINGS: There are no findings suspicious for malignancy.
IMPRESSION: No mammographic evidence of malignancy. A result letter of this
screening mammogram will be mailed directly to the patient.

RECOMMENDATION:
Screening mammogram in one year. (Code:[V2])

BI-RADS CATEGORY  1: Negative.

## 2020-11-26 ENCOUNTER — Encounter: Payer: Self-pay | Admitting: Podiatry

## 2020-11-26 ENCOUNTER — Ambulatory Visit: Payer: Medicare PPO | Admitting: Podiatry

## 2020-11-26 ENCOUNTER — Other Ambulatory Visit: Payer: Self-pay

## 2020-11-26 DIAGNOSIS — M79674 Pain in right toe(s): Secondary | ICD-10-CM

## 2020-11-26 DIAGNOSIS — B351 Tinea unguium: Secondary | ICD-10-CM

## 2020-11-26 DIAGNOSIS — M79675 Pain in left toe(s): Secondary | ICD-10-CM | POA: Diagnosis not present

## 2020-11-26 NOTE — Progress Notes (Signed)
This patient returns to the office for evaluation and treatment of long thick painful  big nails both feet..  This patient is unable to trim her own nails since the patient cannot reach her feet.  Patient says the nails are painful walking and wearing his shoes.  He returns for preventive foot care services.  General Appearance  Alert, conversant and in no acute stress.  Vascular  Dorsalis pedis and posterior tibial  pulses are palpable  bilaterally.  Capillary return is within normal limits  bilaterally. Temperature is within normal limits  bilaterally.  Neurologic  Senn-Weinstein monofilament wire test within normal limits  bilaterally. Muscle power within normal limits bilaterally.  Nails Thick disfigured discolored nails with subungual debris  hallux nails bilaterally. No evidence of bacterial infection or drainage bilaterally.  Orthopedic  No limitations of motion  feet .  No crepitus or effusions noted.  No bony pathology or digital deformities noted.  Skin  normotropic skin with no porokeratosis noted bilaterally.  No signs of infections or ulcers noted.     Onychomycosis  Pain in toes right foot  Pain in toes left foot  Debridement  of nails  1-5  B/L with a nail nipper.  Nails were then filed using a dremel tool with no incidents.    RTC  4 months   Helane Gunther DPM

## 2021-02-22 ENCOUNTER — Ambulatory Visit (INDEPENDENT_AMBULATORY_CARE_PROVIDER_SITE_OTHER): Payer: Medicare PPO

## 2021-02-22 VITALS — Ht 64.0 in | Wt 171.0 lb

## 2021-02-22 DIAGNOSIS — Z0000 Encounter for general adult medical examination without abnormal findings: Secondary | ICD-10-CM | POA: Diagnosis not present

## 2021-02-22 NOTE — Progress Notes (Signed)
Subjective:   Tabitha Lewis is a 71 y.o. female who presents for Medicare Annual (Subsequent) preventive examination.  Review of Systems    No ROS.  Medicare Wellness Virtual Visit.  Visual/audio telehealth visit, UTA vital signs.   See social history for additional risk factors.   Cardiac Risk Factors include: advanced age (>78men, >81 women);diabetes mellitus;hypertension     Objective:    Today's Vitals   02/22/21 0916  Weight: 171 lb (77.6 kg)  Height: $Remove'5\' 4"'HebIXin$  (1.626 m)   Body mass index is 29.35 kg/m.  Advanced Directives 02/22/2021 09/15/2020 09/15/2020 09/07/2020 02/20/2020 02/19/2019  Does Patient Have a Medical Advance Directive? Yes No No Yes Yes Yes  Type of Paramedic of [place];Living will [place];Living will [place];Living will [place];Living will [place];Living will [place];Living will  Does patient want to make changes to medical advance directive? - - - - No - Patient declined No - Patient declined  Copy of [place] in Chart? No - copy requested No - copy requested No - copy requested No - copy requested No - copy requested No - copy requested  Would patient like information on creating a medical advance directive? - No - Patient declined No - Patient declined - - -    Current Medications (verified) Outpatient Encounter Medications as of 02/22/2021  Medication Sig   acetaminophen (TYLENOL) 500 MG tablet Take 1,000 mg by mouth every 6 (six) hours as needed.   Azelastine-Fluticasone 137-50 MCG/ACT SUSP 1 spray bid   bisacodyl 5 MG EC tablet Take 5 mg by mouth daily as needed for moderate constipation. TAKE 1-3 AS NEEDED   cetirizine (ZYRTEC) 10 MG tablet Take by mouth.   Cholecalciferol (VITAMIN D3 PO) Take 2,000 Units by mouth.   cyanocobalamin 1000 MCG tablet Take 1,000 mcg by mouth daily.   docusate sodium (COLACE)  100 MG capsule Take 100 mg by mouth 2 (two) times daily.   lisinopril-hydrochlorothiazide (ZESTORETIC) 20-12.5 MG tablet Take 1 tablet by mouth daily. In am if BP too low <90<60 cut pill in 1/2   Multiple Vitamin (MULTIVITAMIN) capsule Take 1 capsule by mouth daily.   tiZANidine (ZANAFLEX) 2 MG tablet Take by mouth every 6 (six) hours as needed for muscle spasms.   traMADol (ULTRAM) 50 MG tablet Take 1 tablet (50 mg total) by mouth every 6 (six) hours as needed.   traZODone (DESYREL) 100 MG tablet TAKE 1 TABLET (100 MG TOTAL) BY MOUTH AT BEDTIME AS NEEDED FOR SLEEP.   [DISCONTINUED] enoxaparin (LOVENOX) 40 MG/0.4ML injection Inject 0.4 mLs (40 mg total) into the skin daily for 14 days.   [DISCONTINUED] HYDROcodone-acetaminophen (NORCO/VICODIN) 5-325 MG tablet Take 1-2 tablets by mouth every 4 (four) hours as needed for moderate pain (pain score 4-6).   No facility-administered encounter medications on file as of 02/22/2021.    Allergies (verified) Amlodipine, Nsaids, Benzalkonium chloride, Cetyl alcohol, Codeine, Lanolin, Neomycin, Nickel, and Propylene glycol   History: Past Medical History:  Diagnosis Date   AKI (acute kidney injury) ([place])    03/2020-04/2020   Allergy    i.e contact allergies [facility]; also plants, trees    Chicken pox    Depression    Diabetes mellitus without complication ([place])    DIET CONTROLLED   Family history of breast cancer    BRCA negative in the past   Family history of breast cancer  Family history of lung cancer    Family history of rectal cancer    History of prediabetes    Hypertension    Interstitial cystitis    Interstitial cystitis    Prediabetes    A1C 6.3 06/14/16   Spinal stenosis    UTI (urinary tract infection)    UTI (urinary tract infection)    Past Surgical History:  Procedure Laterality Date   CATARACT EXTRACTION Bilateral    b/l Dr. 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   Procedure: TOTAL HIP  ARTHROPLASTY ANTERIOR APPROACH;  Surgeon: Hessie Knows, MD;  Location: [facility] ORS;  Service: Orthopedics;  Laterality: Right;   Family History  Problem Relation Age of Onset   Breast cancer Mother 25       s/p b/l masectomy    Breast cancer Sister 102   Breast cancer Maternal Aunt    Breast cancer Maternal Grandmother    Stroke Father        age 69    Stroke Other        grandmother age 47    Lupus Other    Other Daughter        benign brain tumor   Polycystic ovary syndrome Daughter    Social History   Socioeconomic History   Marital status: Widowed    Spouse name: Not on file   Number of children: Not on file   Years of education: Not on file   Highest education level: Not on file  Occupational History   Not on file  Tobacco Use   Smoking status: Former   Smokeless tobacco: Never   Tobacco comments:    smoked 4-5 years in college then quit  Vaping Use   Vaping Use: Never used  Substance and Sexual Activity   Alcohol use: No   Drug use: No   Sexual activity: Not on file  Other Topics Concern   Not on file  Social History Narrative   1 daughter and 1 son    3 grandsons    Married to high school sweet heart (husband) x 48 years dx'ed with prostate cancer in 2015-03-31 he died 05/30/17    Retired Event organiser education [employer]   Social Determinants of Health   Financial Resource Strain: Low Risk    Difficulty of Paying Living Expenses: Not hard at all  Food Insecurity: No Food Insecurity   Worried About Charity fundraiser in the Last Year: Never true   Arboriculturist in the Last Year: Never true  Transportation Needs: No Transportation Needs   Lack of Transportation (Medical): No   Lack of Transportation (Non-Medical): No  Physical Activity: Not on file  Stress: No Stress Concern Present   Feeling of Stress : Only a little  Social Connections: Unknown   Frequency of Communication with Friends and Family: More than three times a week   Frequency of  Social Gatherings with Friends and Family: More than three times a week   Attends Religious Services: Not on Electrical engineer or Organizations: Yes   Attends Archivist Meetings: 1 to 4 times per year   Marital Status: Married    Tobacco Counseling Counseling given: Not Answered Tobacco comments: smoked 4-5 years in college then quit   Clinical Intake:  Pre-visit preparation completed: Yes        Diabetes:  (Followed by PCP. Controlled with diet and exercise.)  How  often do you need to have someone help you when you read instructions, pamphlets, or other written materials from your doctor or pharmacy?: 1 - Never   Interpreter Needed?: No      Activities of Daily Living In your present state of health, do you have any difficulty performing the following activities: 02/22/2021 09/15/2020  Hearing? N N  Vision? N N  Difficulty concentrating or making decisions? Y N  Comment Notes some dificulty making decisions due to anxiety. Follow up scheduled with PCP. -  Walking or climbing stairs? Y Y  Comment Recent surgery. Paces self. -  Dressing or bathing? N N  Doing errands, shopping? N N  Preparing Food and eating ? N -  Using the Toilet? N -  In the past six months, have you accidently leaked urine? N -  Do you have problems with loss of bowel control? N -  Managing your Medications? N -  Managing your Finances? N -  Housekeeping or managing your Housekeeping? N -  Some recent data might be hidden    Patient Care Team: McLean-Scocuzza, Nino Glow, MD as PCP - General (Internal Medicine)  Indicate any recent Medical Services you may have received from other than [facility] providers in the past year (date may be approximate).     Assessment:   This is a routine wellness examination for [place].  Virtual Visit via Telephone Note  I connected with  Domenica Fail on 02/22/21 at  9:00 AM EST by telephone and verified that I am speaking with the  correct person using two identifiers.  Persons participating in the virtual visit: patient/Nurse Health Advisor   I discussed the limitations, risks, security and privacy concerns of performing an evaluation and management service by telephone and the availability of in person appointments. The patient expressed understanding and agreed to proceed.  Interactive audio and video telecommunications were attempted between this nurse and patient, however failed, due to patient having technical difficulties OR patient did not have access to video capability.  We continued and completed visit with audio only.  Some vital signs may be absent or patient reported.   Hearing/Vision screen Hearing Screening - Comments:: Patient is able to hear conversational tones without difficulty. No issues reported. Vision Screening - Comments:: Wears corrective lenses  Cataract extraction, bilateral  They have seen their ophthalmologist in the last 12 months.   Dietary issues and exercise activities discussed: Current Exercise Habits: Home exercise routine, Intensity: Mild Healthy diet Good water intake   Goals Addressed               This Visit's Progress     Patient Stated     Increase physical activity (pt-stated)        As tolerated       Depression Screen PHQ 2/9 Scores 02/22/2021 08/21/2020 02/20/2020 09/25/2019 03/14/2019 02/19/2019 06/19/2017  PHQ - 2 Score 0 0 0 0 0 0 0    Fall Risk Fall Risk  02/22/2021 08/21/2020 06/12/2020 03/25/2020 02/21/2020  Falls in the past year? 0 1 0 0 0  Number falls in past yr: 0 0 0 0 0  Injury with Fall? 0 1 0 0 0  Follow up Falls evaluation completed Falls evaluation completed Falls evaluation completed Falls evaluation completed Falls evaluation completed    [place]: Home free of loose throw rugs in walkways, pet beds, electrical cords, etc? Yes  Adequate lighting in your home to reduce risk of falls?  Yes   ASSISTIVE DEVICES  UTILIZED TO PREVENT FALLS: Life alert? No  Use of a cane, walker or w/c? No   TIMED UP AND GO: Was the test performed? No .   Cognitive Function:     6CIT Screen 02/22/2021 02/19/2019  What Year? 0 points 0 points  What month? 0 points 0 points  What time? 0 points 0 points  Count back from 20 - 0 points  Months in reverse 0 points 0 points  Repeat phrase - 0 points  Total Score - 0    Immunizations Immunization History  Administered Date(s) Administered   Fluad Quad(high Dose 65+) 09/25/2019   Hepatitis B, adult 02/02/2017, 03/07/2017, 08/03/2017   Influenza Split 11/20/2014   Influenza, High Dose Seasonal PF 09/27/2016, 09/26/2017, 09/27/2018, 11/17/2020   Influenza-Unspecified 10/20/2015, 09/27/2016   PFIZER Comirnaty(Gray Top)Covid-19 Tri-Sucrose Vaccine 07/02/2020   PFIZER(Purple Top)SARS-COV-2 Vaccination 02/05/2019, 02/26/2019, 10/15/2019   Pfizer Covid-19 Vaccine Bivalent Booster 35yrs & up 11/17/2020   Pneumococcal Conjugate-13 09/13/2017   Pneumococcal Polysaccharide-23 11/20/2014   Tdap 07/22/2015   Shingrix Completed?: No.    Education has been provided regarding the importance of this vaccine. Patient has been advised to call insurance company to determine out of pocket expense if they have not yet received this vaccine. Advised may also receive vaccine at local pharmacy or Health Dept. Verbalized acceptance and understanding.  Screening Tests Health Maintenance  Topic Date Due   OPHTHALMOLOGY EXAM  05/17/2021 (Originally 07/10/1960)   Zoster Vaccines- Shingrix (1 of 2) 05/22/2021 (Originally 07/10/1998)   DEXA SCAN  02/22/2022 (Originally 07/09/2013)   HEMOGLOBIN A1C  02/24/2021   FOOT EXAM  11/26/2021   MAMMOGRAM  11/10/2022   Fecal DNA (Cologuard)  02/12/2023   TETANUS/TDAP  07/21/2025   Pneumonia Vaccine 52+ Years old  Completed   INFLUENZA VACCINE  Completed   COVID-19 Vaccine  Completed   Hepatitis C Screening  Completed   HPV VACCINES  Aged Out    COLONOSCOPY (Pts 45-56yrs Insurance coverage will need to be confirmed)  Discontinued   Health Maintenance There are no preventive care reminders to display for this patient.  Bone density- patient declined.   Lung Cancer Screening: (Low Dose CT Chest recommended if Age 59-80 years, 30 pack-year currently smoking OR have quit w/in 15years.) does not qualify.   Vision Screening: Recommended annual ophthalmology exams for early detection of glaucoma and other disorders of the eye. Eye exam scheduled 05/2021.  Dental Screening: Recommended annual dental exams for proper oral hygiene  Community Resource Referral / Chronic Care Management: CRR required this visit?  No   CCM required this visit?  No      Plan:   Keep all routine maintenance appointments.   I have personally reviewed and noted the following in the patients chart:   Medical and social history Use of alcohol, tobacco or illicit drugs  Current medications and supplements including opioid prescriptions. Taking Tramadol. Followed by PCP. Functional ability and status Nutritional status Physical activity Advanced directives List of other physicians Hospitalizations, surgeries, and ER visits in previous 12 months Vitals Screenings to include cognitive, depression, and falls Referrals and appointments  In addition, I have reviewed and discussed with patient certain preventive protocols, quality metrics, and best practice recommendations. A written personalized care plan for preventive services as well as general preventive health recommendations were provided to patient.     Varney Biles, LPN   05/20/6268

## 2021-02-22 NOTE — Patient Instructions (Addendum)
Tabitha Lewis , Thank you for taking time to come for your Medicare Wellness Visit. I appreciate your ongoing commitment to your health goals. Please review the following plan we discussed and let me know if I can assist you in the future.   These are the goals we discussed:  Goals       Patient Stated     Increase physical activity (pt-stated)      As tolerated        This is a list of the screening recommended for you and due dates:  Health Maintenance  Topic Date Due   Eye exam for diabetics  05/17/2021*   Zoster (Shingles) Vaccine (1 of 2) 05/22/2021*   DEXA scan (bone density measurement)  02/22/2022*   Hemoglobin A1C  02/24/2021   Complete foot exam   11/26/2021   Mammogram  11/10/2022   Cologuard (Stool DNA test)  02/12/2023   Tetanus Vaccine  07/21/2025   Pneumonia Vaccine  Completed   Flu Shot  Completed   COVID-19 Vaccine  Completed   Hepatitis C Screening: USPSTF Recommendation to screen - Ages 18-79 yo.  Completed   HPV Vaccine  Aged Out   Colon Cancer Screening  Discontinued  *Topic was postponed. The date shown is not the original due date.     Advanced directives: End of life planning; Advance aging; Advanced directives discussed.  Copy of current HCPOA/Living Will requested.    Next appointment: Follow up in one year for your annual wellness visit    Preventive Care 65 Years and Older, Female Preventive care refers to lifestyle choices and visits with your health care provider that can promote health and wellness. What does preventive care include? A yearly physical exam. This is also called an annual well check. Dental exams once or twice a year. Routine eye exams. Ask your health care provider how often you should have your eyes checked. Personal lifestyle choices, including: Daily care of your teeth and gums. Regular physical activity. Eating a healthy diet. Avoiding tobacco and drug use. Limiting alcohol use. Practicing safe sex. Taking  low-dose aspirin every day. Taking vitamin and mineral supplements as recommended by your health care provider. What happens during an annual well check? The services and screenings done by your health care provider during your annual well check will depend on your age, overall health, lifestyle risk factors, and family history of disease. Counseling  Your health care provider may ask you questions about your: Alcohol use. Tobacco use. Drug use. Emotional well-being. Home and relationship well-being. Sexual activity. Eating habits. History of falls. Memory and ability to understand (cognition). Work and work Astronomer. Reproductive health. Screening  You may have the following tests or measurements: Height, weight, and BMI. Blood pressure. Lipid and cholesterol levels. These may be checked every 5 years, or more frequently if you are over 56 years old. Skin check. Lung cancer screening. You may have this screening every year starting at age 55 if you have a 30-pack-year history of smoking and currently smoke or have quit within the past 15 years. Fecal occult blood test (FOBT) of the stool. You may have this test every year starting at age 28. Flexible sigmoidoscopy or colonoscopy. You may have a sigmoidoscopy every 5 years or a colonoscopy every 10 years starting at age 33. Hepatitis C blood test. Hepatitis B blood test. Sexually transmitted disease (STD) testing. Diabetes screening. This is done by checking your blood sugar (glucose) after you have not eaten for a while (  fasting). You may have this done every 1-3 years. Bone density scan. This is done to screen for osteoporosis. You may have this done starting at age 37. Mammogram. This may be done every 1-2 years. Talk to your health care provider about how often you should have regular mammograms. Talk with your health care provider about your test results, treatment options, and if necessary, the need for more tests. Vaccines   Your health care provider may recommend certain vaccines, such as: Influenza vaccine. This is recommended every year. Tetanus, diphtheria, and acellular pertussis (Tdap, Td) vaccine. You may need a Td booster every 10 years. Zoster vaccine. You may need this after age 67. Pneumococcal 13-valent conjugate (PCV13) vaccine. One dose is recommended after age 17. Pneumococcal polysaccharide (PPSV23) vaccine. One dose is recommended after age 83. Talk to your health care provider about which screenings and vaccines you need and how often you need them. This information is not intended to replace advice given to you by your health care provider. Make sure you discuss any questions you have with your health care provider. Document Released: 01/30/2015 Document Revised: 09/23/2015 Document Reviewed: 11/04/2014 Elsevier Interactive Patient Education  2017 [employer].  Fall Prevention in the Home Falls can cause injuries. They can happen to people of all ages. There are many things you can do to make your home safe and to help prevent falls. What can I do on the outside of my home? Regularly fix the edges of walkways and driveways and fix any cracks. Remove anything that might make you trip as you walk through a door, such as a raised step or threshold. Trim any bushes or trees on the path to your home. Use bright outdoor lighting. Clear any walking paths of anything that might make someone trip, such as rocks or tools. Regularly check to see if handrails are loose or broken. Make sure that both sides of any steps have handrails. Any raised decks and porches should have guardrails on the edges. Have any leaves, snow, or ice cleared regularly. Use sand or salt on walking paths during winter. Clean up any spills in your garage right away. This includes oil or grease spills. What can I do in the bathroom? Use night lights. Install grab bars by the toilet and in the tub and shower. Do not use towel  bars as grab bars. Use non-skid mats or decals in the tub or shower. If you need to sit down in the shower, use a plastic, non-slip stool. Keep the floor dry. Clean up any water that spills on the floor as soon as it happens. Remove soap buildup in the tub or shower regularly. Attach bath mats securely with double-sided non-slip rug tape. Do not have throw rugs and other things on the floor that can make you trip. What can I do in the bedroom? Use night lights. Make sure that you have a light by your bed that is easy to reach. Do not use any sheets or blankets that are too big for your bed. They should not hang down onto the floor. Have a firm chair that has side arms. You can use this for support while you get dressed. Do not have throw rugs and other things on the floor that can make you trip. What can I do in the kitchen? Clean up any spills right away. Avoid walking on wet floors. Keep items that you use a lot in easy-to-reach places. If you need to reach something above you, use  a strong step stool that has a grab bar. Keep electrical cords out of the way. Do not use floor polish or wax that makes floors slippery. If you must use wax, use non-skid floor wax. Do not have throw rugs and other things on the floor that can make you trip. What can I do with my stairs? Do not leave any items on the stairs. Make sure that there are handrails on both sides of the stairs and use them. Fix handrails that are broken or loose. Make sure that handrails are as long as the stairways. Check any carpeting to make sure that it is firmly attached to the stairs. Fix any carpet that is loose or worn. Avoid having throw rugs at the top or bottom of the stairs. If you do have throw rugs, attach them to the floor with carpet tape. Make sure that you have a light switch at the top of the stairs and the bottom of the stairs. If you do not have them, ask someone to add them for you. What else can I do to help  prevent falls? Wear shoes that: Do not have high heels. Have rubber bottoms. Are comfortable and fit you well. Are closed at the toe. Do not wear sandals. If you use a stepladder: Make sure that it is fully opened. Do not climb a closed stepladder. Make sure that both sides of the stepladder are locked into place. Ask someone to hold it for you, if possible. Clearly mark and make sure that you can see: Any grab bars or handrails. First and last steps. Where the edge of each step is. Use tools that help you move around (mobility aids) if they are needed. These include: Canes. Walkers. Scooters. Crutches. Turn on the lights when you go into a dark area. Replace any light bulbs as soon as they burn out. Set up your furniture so you have a clear path. Avoid moving your furniture around. If any of your floors are uneven, fix them. If there are any pets around you, be aware of where they are. Review your medicines with your doctor. Some medicines can make you feel dizzy. This can increase your chance of falling. Ask your doctor what other things that you can do to help prevent falls. This information is not intended to replace advice given to you by your health care provider. Make sure you discuss any questions you have with your health care provider. Document Released: 10/30/2008 Document Revised: 06/11/2015 Document Reviewed: 02/07/2014 Elsevier Interactive Patient Education  2017 [employer].

## 2021-02-23 ENCOUNTER — Ambulatory Visit: Payer: Medicare PPO | Admitting: Internal Medicine

## 2021-02-23 ENCOUNTER — Encounter: Payer: Self-pay | Admitting: Internal Medicine

## 2021-02-23 ENCOUNTER — Other Ambulatory Visit: Payer: Self-pay

## 2021-02-23 VITALS — BP 120/64 | HR 92 | Temp 98.6°F | Ht 64.0 in | Wt 166.6 lb

## 2021-02-23 DIAGNOSIS — E538 Deficiency of other specified B group vitamins: Secondary | ICD-10-CM

## 2021-02-23 DIAGNOSIS — Z1231 Encounter for screening mammogram for malignant neoplasm of breast: Secondary | ICD-10-CM

## 2021-02-23 DIAGNOSIS — M419 Scoliosis, unspecified: Secondary | ICD-10-CM

## 2021-02-23 DIAGNOSIS — D649 Anemia, unspecified: Secondary | ICD-10-CM | POA: Diagnosis not present

## 2021-02-23 DIAGNOSIS — J309 Allergic rhinitis, unspecified: Secondary | ICD-10-CM

## 2021-02-23 DIAGNOSIS — E2839 Other primary ovarian failure: Secondary | ICD-10-CM

## 2021-02-23 DIAGNOSIS — Z1329 Encounter for screening for other suspected endocrine disorder: Secondary | ICD-10-CM | POA: Diagnosis not present

## 2021-02-23 DIAGNOSIS — E559 Vitamin D deficiency, unspecified: Secondary | ICD-10-CM

## 2021-02-23 DIAGNOSIS — I10 Essential (primary) hypertension: Secondary | ICD-10-CM | POA: Diagnosis not present

## 2021-02-23 DIAGNOSIS — M47816 Spondylosis without myelopathy or radiculopathy, lumbar region: Secondary | ICD-10-CM

## 2021-02-23 DIAGNOSIS — I83813 Varicose veins of bilateral lower extremities with pain: Secondary | ICD-10-CM | POA: Diagnosis not present

## 2021-02-23 DIAGNOSIS — I781 Nevus, non-neoplastic: Secondary | ICD-10-CM

## 2021-02-23 DIAGNOSIS — R937 Abnormal findings on diagnostic imaging of other parts of musculoskeletal system: Secondary | ICD-10-CM

## 2021-02-23 DIAGNOSIS — M79604 Pain in right leg: Secondary | ICD-10-CM

## 2021-02-23 DIAGNOSIS — M1712 Unilateral primary osteoarthritis, left knee: Secondary | ICD-10-CM

## 2021-02-23 DIAGNOSIS — F419 Anxiety disorder, unspecified: Secondary | ICD-10-CM | POA: Diagnosis not present

## 2021-02-23 DIAGNOSIS — G4700 Insomnia, unspecified: Secondary | ICD-10-CM

## 2021-02-23 DIAGNOSIS — M79605 Pain in left leg: Secondary | ICD-10-CM

## 2021-02-23 LAB — CBC WITH DIFFERENTIAL/PLATELET
Basophils Absolute: 0.1 10*3/uL (ref 0.0–0.1)
Basophils Relative: 0.9 % (ref 0.0–3.0)
Eosinophils Absolute: 0.1 10*3/uL (ref 0.0–0.7)
Eosinophils Relative: 1 % (ref 0.0–5.0)
HCT: 38.1 % (ref 36.0–46.0)
Hemoglobin: 12.6 g/dL (ref 12.0–15.0)
Lymphocytes Relative: 21.5 % (ref 12.0–46.0)
Lymphs Abs: 1.7 10*3/uL (ref 0.7–4.0)
MCHC: 32.9 g/dL (ref 30.0–36.0)
MCV: 83.6 fl (ref 78.0–100.0)
Monocytes Absolute: 0.6 10*3/uL (ref 0.1–1.0)
Monocytes Relative: 8.4 % (ref 3.0–12.0)
Neutro Abs: 5.3 10*3/uL (ref 1.4–7.7)
Neutrophils Relative %: 68.2 % (ref 43.0–77.0)
Platelets: 314 10*3/uL (ref 150.0–400.0)
RBC: 4.56 Mil/uL (ref 3.87–5.11)
RDW: 13.2 % (ref 11.5–15.5)
WBC: 7.8 10*3/uL (ref 4.0–10.5)

## 2021-02-23 LAB — LIPID PANEL
Cholesterol: 171 mg/dL (ref 0–200)
HDL: 56 mg/dL (ref >39.00)
LDL Cholesterol: 95 mg/dL (ref 0–99)
NonHDL: 115.28
Total CHOL/HDL Ratio: 3
Triglycerides: 100 mg/dL (ref 0.0–149.0)
VLDL: 20 mg/dL (ref 0.0–40.0)

## 2021-02-23 LAB — COMPREHENSIVE METABOLIC PANEL
ALT: 14 U/L (ref 0–35)
AST: 15 U/L (ref 0–37)
Albumin: 4.5 g/dL (ref 3.5–5.2)
Alkaline Phosphatase: 65 U/L (ref 39–117)
BUN: 19 mg/dL (ref 6–23)
CO2: 34 mEq/L — ABNORMAL HIGH (ref 19–32)
Calcium: 10 mg/dL (ref 8.4–10.5)
Chloride: 96 mEq/L (ref 96–112)
Creatinine, Ser: 0.96 mg/dL (ref 0.40–1.20)
GFR: 59.06 mL/min — ABNORMAL LOW (ref >60.00)
Glucose, Bld: 94 mg/dL (ref 70–99)
Potassium: 3.9 mEq/L (ref 3.5–5.1)
Sodium: 136 mEq/L (ref 135–145)
Total Bilirubin: 0.7 mg/dL (ref 0.2–1.2)
Total Protein: 7.1 g/dL (ref 6.0–8.3)

## 2021-02-23 LAB — IBC + FERRITIN
Ferritin: 155 ng/mL (ref 10.0–291.0)
Iron: 95 ug/dL (ref 42–145)
Saturation Ratios: 27.1 % (ref 20.0–50.0)
TIBC: 350 ug/dL (ref 250.0–450.0)
Transferrin: 250 mg/dL (ref 212.0–360.0)

## 2021-02-23 LAB — VITAMIN B12: Vitamin B-12: 376 pg/mL (ref 211–911)

## 2021-02-23 LAB — VITAMIN D 25 HYDROXY (VIT D DEFICIENCY, FRACTURES): VITD: 39.71 ng/mL (ref 30.00–100.00)

## 2021-02-23 LAB — TSH: TSH: 2.48 u[IU]/mL (ref 0.35–5.50)

## 2021-02-23 MED ORDER — LISINOPRIL-HYDROCHLOROTHIAZIDE 20-12.5 MG PO TABS: 1.0000 | ORAL_TABLET | Freq: Every day | ORAL | 3 refills | Status: DC

## 2021-02-23 MED ORDER — AZELASTINE-FLUTICASONE 137-50 MCG/ACT NA SUSP: NASAL | 11 refills | Status: DC

## 2021-02-23 MED ORDER — CITALOPRAM HYDROBROMIDE 10 MG PO TABS: 10.0000 mg | ORAL_TABLET | Freq: Every day | ORAL | 3 refills | Status: DC

## 2021-02-23 MED ORDER — TRAZODONE HCL 100 MG PO TABS: 100.0000 mg | ORAL_TABLET | Freq: Every evening | ORAL | 3 refills | Status: DC | PRN

## 2021-02-23 MED ORDER — CETIRIZINE HCL 10 MG PO TABS: 10.0000 mg | ORAL_TABLET | Freq: Every day | ORAL | 3 refills | Status: DC | PRN

## 2021-02-23 NOTE — Patient Instructions (Addendum)
Aspercream with lidocaine or biofreeze  Tylenol  Tumeric   Message me in 1 month about celexa and how its going   Let me know if you want to see [facility] Dr. Lorretta Harp    Citalopram Tablets What is this medication? CITALOPRAM (sye TAL oh pram) treats depression. It increases the amount of serotonin in the brain, a hormone that helps regulate mood. It belongs to a group of medications called SSRIs. This medicine may be used for other purposes; ask your health care provider or pharmacist if you have questions. COMMON BRAND NAME(S): Celexa What should I tell my care team before I take this medication? They need to know if you have any of these conditions: Bipolar disorder or a family history of bipolar disorder Bleeding disorders Glaucoma Heart disease History of irregular heartbeat Kidney disease Liver disease Low levels of magnesium or potassium in the blood Receiving electroconvulsive therapy Seizures Suicidal thoughts, plans, or attempt; a previous suicide attempt by you or a family member Take medications that treat or prevent blood clots Thyroid disease An unusual or allergic reaction to citalopram, escitalopram, other medications, foods, dyes, or preservatives Pregnant or trying to become pregnant Breast-feeding How should I use this medication? Take this medication by mouth with a glass of water. Follow the directions on the prescription label. You can take it with or without food. Take your medication at regular intervals. Do not take your medication more often than directed. Do not stop taking this medication suddenly except upon the advice of your care team. Stopping this medication too quickly may cause serious side effects or your condition may worsen. A special MedGuide will be given to you by the pharmacist with each prescription and refill. Be sure to read this information carefully each time. Talk to your care team about the use of this medication in  children. Special care may be needed. Patients over 70 years old may have a stronger reaction and need a smaller dose. Overdosage: If you think you have taken too much of this medicine contact a poison control center or emergency room at once. NOTE: This medicine is only for you. Do not share this medicine with others. What if I miss a dose? If you miss a dose, take it as soon as you can. If it is almost time for your next dose, take only that dose. Do not take double or extra doses. What may interact with this medication? Do not take this medication with any of the following: Certain medications for fungal infections like fluconazole, itraconazole, ketoconazole, posaconazole, voriconazole Cisapride Dronedarone Escitalopram Linezolid MAOIs like Carbex, Eldepryl, Marplan, Nardil, and Parnate Methylene blue (injected into a vein) Pimozide Thioridazine This medication may also interact with the following: Alcohol Amphetamines Aspirin and aspirin-like medications Carbamazepine Certain medications for depression, anxiety, or psychotic disturbances Certain medications for infections like chloroquine, clarithromycin, erythromycin, furazolidone, isoniazid, pentamidine Certain medications for migraine headaches like almotriptan, eletriptan, frovatriptan, naratriptan, rizatriptan, sumatriptan, zolmitriptan Certain medications for sleep Certain medications that treat or prevent blood clots like dalteparin, enoxaparin, warfarin Cimetidine Diuretics Dofetilide Fentanyl Lithium Methadone Metoprolol NSAIDs, medications for pain and inflammation, like ibuprofen or naproxen Omeprazole Other medications that prolong the QT interval (cause an abnormal heart rhythm) Procarbazine Rasagiline Supplements like St. John's wort, kava kava, valerian Tramadol Tryptophan Ziprasidone This list may not describe all possible interactions. Give your health care provider a list of all the medicines, herbs,  non-prescription drugs, or dietary supplements you use. Also tell them if you  smoke, drink alcohol, or use illegal drugs. Some items may interact with your medicine. What should I watch for while using this medication? Tell your care team if your symptoms do not get better or if they get worse. Visit your care team for regular checks on your progress. Because it may take several weeks to see the full effects of this medication, it is important to continue your treatment as prescribed. Watch for new or worsening thoughts of suicide or depression. This includes sudden changes in mood, behavior, or thoughts. These changes can happen at any time but are more common in the beginning of treatment or after a change in dose. Call your care team right away if you experience these thoughts or worsening depression. Manic episodes may happen in patients with bipolar disorder who take this medication. Watch for changes in feelings or behaviors such as feeling anxious, nervous, agitated, panicky, irritable, hostile, aggressive, impulsive, severely restless, overly excited and hyperactive, or trouble sleeping. These symptoms can happen at anytime but are more common in the beginning of treatment or after a change in dose. Call you care team right away if you notice any of these symptoms. You may get drowsy or dizzy. Do not drive, use machinery, or do anything that needs mental alertness until you know how this medication affects you. Do not stand or sit up quickly, especially if you are an older patient. This reduces the risk of dizzy or fainting spells. Alcohol may interfere with the effect of this medication. Avoid alcoholic drinks. Your mouth may get dry. Chewing sugarless gum or sucking hard candy, and drinking plenty of water may help. Contact your care team if the problem does not go away or is severe. What side effects may I notice from receiving this medication? Side effects that you should report to your care team  as soon as possible: Allergic reactions--skin rash, itching, hives, swelling of the face, lips, tongue, or throat Bleeding--bloody or black, tar-like stools, red or dark brown urine, vomiting blood or brown material that looks like coffee grounds, small, red or purple spots on skin, unusual bleeding or bruising Heart rhythm changes--fast or irregular heartbeat, dizziness, feeling faint or lightheaded, chest pain, trouble breathing Low sodium level--muscle weakness, fatigue, dizziness, headache, confusion Serotonin syndrome--irritability, confusion, fast or irregular heartbeat, muscle stiffness, twitching muscles, sweating, high fever, seizure, chills, vomiting, diarrhea Sudden eye pain or change in vision such as blurry vision, seeing halos around lights, vision loss Thoughts of suicide or self-harm, worsening mood, feelings of depression Side effects that usually do not require medical attention (report to your care team if they continue or are bothersome): Change in sex drive or performance Diarrhea Dry mouth Excessive sweating Nausea Tremors or shaking Upset stomach This list may not describe all possible side effects. Call your doctor for medical advice about side effects. You may report side effects to FDA at 1-800-FDA-1088. Where should I keep my medication? Keep out of reach of children and pets. Store at room temperature between 15 and 30 degrees C (59 and 86 degrees F). Throw away any unused medication after the expiration date. NOTE: This sheet is a summary. It may not cover all possible information. If you have questions about this medicine, talk to your doctor, pharmacist, or health care provider.  2022 Elsevier/Gold Standard (2020-01-06 00:00:00)  Generalized Anxiety Disorder, Adult Generalized anxiety disorder (GAD) is a mental health condition. Unlike normal worries, anxiety related to GAD is not triggered by a specific event. These worries do  not fade or get better with time.  GAD interferes with relationships, work, and school. GAD symptoms can vary from mild to severe. People with severe GAD can have intense waves of anxiety with physical symptoms that are similar to panic attacks. What are the causes? The exact cause of GAD is not known, but the following are believed to have an impact: Differences in natural brain chemicals. Genes passed down from parents to children. Differences in the way threats are perceived. Development and stress during childhood. Personality. What increases the risk? The following factors may make you more likely to develop this condition: Being female. Having a family history of anxiety disorders. Being very shy. Experiencing very stressful life events, such as the death of a loved one. Having a very stressful family environment. What are the signs or symptoms? People with GAD often worry excessively about many things in their lives, such as their health and family. Symptoms may also include: Mental and emotional symptoms: Worrying excessively about natural disasters. Fear of being late. Difficulty concentrating. Fears that others are judging your performance. Physical symptoms: Fatigue. Headaches, muscle tension, muscle twitches, trembling, or feeling shaky. Feeling like your heart is pounding or beating very fast. Feeling out of breath or like you cannot take a deep breath. Having trouble falling asleep or staying asleep, or experiencing restlessness. Sweating. Nausea, diarrhea, or irritable bowel syndrome (IBS). Behavioral symptoms: Experiencing erratic moods or irritability. Avoidance of new situations. Avoidance of people. Extreme difficulty making decisions. How is this diagnosed? This condition is diagnosed based on your symptoms and medical history. You will also have a physical exam. Your health care provider may perform tests to rule out other possible causes of your symptoms. To be diagnosed with GAD, a person  must have anxiety that: Is out of his or her control. Affects several different aspects of his or her life, such as work and relationships. Causes distress that makes him or her unable to take part in normal activities. Includes at least three symptoms of GAD, such as restlessness, fatigue, trouble concentrating, irritability, muscle tension, or sleep problems. Before your health care provider can confirm a diagnosis of GAD, these symptoms must be present more days than they are not, and they must last for 6 months or longer. How is this treated? This condition may be treated with: Medicine. Antidepressant medicine is usually prescribed for long-term daily control. Anti-anxiety medicines may be added in severe cases, especially when panic attacks occur. Talk therapy (psychotherapy). Certain types of talk therapy can be helpful in treating GAD by providing support, education, and guidance. Options include: Cognitive behavioral therapy (CBT). People learn coping skills and self-calming techniques to ease their physical symptoms. They learn to identify unrealistic thoughts and behaviors and to replace them with more appropriate thoughts and behaviors. Acceptance and commitment therapy (ACT). This treatment teaches people how to be mindful as a way to cope with unwanted thoughts and feelings. Biofeedback. This process trains you to manage your body's response (physiological response) through breathing techniques and relaxation methods. You will work with a therapist while machines are used to monitor your physical symptoms. Stress management techniques. These include yoga, meditation, and exercise. A mental health specialist can help determine which treatment is best for you. Some people see improvement with one type of therapy. However, other people require a combination of therapies. Follow these instructions at home: Lifestyle Maintain a consistent routine and schedule. Anticipate stressful  situations. Create a plan and allow extra time to work with  your plan. Practice stress management or self-calming techniques that you have learned from your therapist or your health care provider. Exercise regularly and spend time outdoors. Eat a healthy diet that includes plenty of vegetables, fruits, whole grains, low-fat dairy products, and lean protein. Do not eat a lot of foods that are high in fat, added sugar, or salt (sodium). Drink plenty of water. Avoid alcohol. Alcohol can increase anxiety. Avoid caffeine and certain over-the-counter cold medicines. These may make you feel worse. Ask your pharmacist which medicines to avoid. General instructions Take over-the-counter and prescription medicines only as told by your health care provider. Understand that you are likely to have setbacks. Accept this and be kind to yourself as you persist to take better care of yourself. Anticipate stressful situations. Create a plan and allow extra time to work with your plan. Recognize and accept your accomplishments, even if you judge them as small. Spend time with people who care about you. Keep all follow-up visits. This is important. Where to find more information [employer]: http://www.maynard.net/www.nimh.nih.gov Substance Abuse and Mental Health Services: [employer].com.ptwww.samhsa.gov Contact a health care provider if: Your symptoms do not get better. Your symptoms get worse. You have signs of depression, such as: A persistently sad or irritable mood. Loss of enjoyment in activities that used to bring you joy. Change in weight or eating. Changes in sleeping habits. Get help right away if: You have thoughts about hurting yourself or others. If you ever feel like you may hurt yourself or others, or have thoughts about taking your own life, get help right away. Go to your nearest emergency department or: Call your local emergency services (911 in the [place] Call a suicide crisis helpline, such as the  National Suicide Prevention Lifeline at (305)021-35961-564-028-8537 or 988 in the [place] This is open 24 hours a day in the [place] Text the Crisis Text Line at (231) 049-6770741741 (in the [place] Summary Generalized anxiety disorder (GAD) is a mental health condition that involves worry that is not triggered by a specific event. People with GAD often worry excessively about many things in their lives, such as their health and family. GAD may cause symptoms such as restlessness, trouble concentrating, sleep problems, frequent sweating, nausea, diarrhea, headaches, and trembling or muscle twitching. A mental health specialist can help determine which treatment is best for you. Some people see improvement with one type of therapy. However, other people require a combination of therapies. This information is not intended to replace advice given to you by your health care provider. Make sure you discuss any questions you have with your health care provider. Document Revised: 07/29/2020 Document Reviewed: 04/26/2020 Elsevier Patient Education  2022 [employer] Inc.   Mindfulness-Based Stress Reduction Mindfulness-based stress reduction (MBSR) is a program that helps people learn to practice mindfulness. Mindfulness is the practice of consciously paying attention to the present moment. MBSR focuses on developing self-awareness, which lets you respond to life stress without judgment or negative feelings. It can be learned and practiced through techniques such as education, breathing exercises, meditation, and yoga. MBSR includes several mindfulness techniques in one program. MBSR works best when you understand the treatment, are willing to try new things, and can commit to spending time practicing what you learn. MBSR training may include learning about: How your feelings, thoughts, and reactions affect your body. New ways to respond to things that cause negative thoughts to start (triggers). How to notice your thoughts and let go of  them. Practicing awareness  of everyday things that you normally do without thinking. The techniques and goals of different types of meditation. What are the benefits of MBSR? MBSR can have many benefits, which include helping you to: Develop self-awareness. This means knowing and understanding yourself. Learn skills and attitudes that help you to take part in your own health care. Learn new ways to care for yourself. Be more accepting about how things are, and let things go. Be less judgmental and approach things with an open mind. Be patient with yourself and trust yourself more. MBSR has also been shown to: Reduce negative emotions, such as sadness, overwhelm, and worry. Improve memory and focus. Change how you sense and react to pain. Boost your body's ability to fight infections. Help you connect better with other people. Improve your sense of well-being. How to practice mindfulness To do a basic awareness exercise: Find a comfortable place to sit. Pay attention to the present moment. Notice your thoughts, feelings, and surroundings just as they are. Avoid judging yourself, your feelings, or your surroundings. Make note of any judgment that comes up and let it go. Your mind may wander, and that is okay. Make note of when your thoughts drift, and return your attention to the present moment. To do basic mindfulness meditation: Find a comfortable place to sit. This may include a stable chair or a firm floor cushion. Sit upright with your back straight. Let your arms fall next to your sides, with your hands resting on your legs. If you are sitting in a chair, rest your feet flat on the floor. If you are sitting on a cushion, cross your legs in front of you. Keep your head in a neutral position with your chin dropped slightly. Relax your jaw and rest the tip of your tongue on the roof of your mouth. Drop your gaze to the floor or close your eyes. Breathe normally and pay attention to  your breath. Feel the air moving in and out of your nose. Feel your belly expanding and relaxing with each breath. Your mind may wander, and that is okay. Make note of when your thoughts drift, and return your attention to your breath. Avoid judging yourself, your feelings, or your surroundings. Make note of any judgment or feelings that come up, let them go, and bring your attention back to your breath. When you are ready, lift your gaze or open your eyes. Pay attention to how your body feels after the meditation. Follow these instructions at home:  Find a local in-person or online MBSR program. Set aside some time regularly for mindfulness practice. Practice every day if you can. Even 10 minutes of practice is helpful. Find a mindfulness practice that works best for you. This may include one or more of the following: Meditation. This involves focusing your mind on a certain thought or activity. Breathing awareness exercises. These help you to stay present by focusing on your breath. Body scan. For this practice, you lie down and pay attention to each part of your body from head to toe. You can identify tension and soreness and consciously relax parts of your body. Yoga. Yoga involves stretching and breathing, and it can improve your ability to move and be flexible. It can also help you to test your body's limits, which can help you release stress. Mindful eating. This way of eating involves focusing on the taste, texture, color, and smell of each bite of food. This slows down eating and helps you feel full sooner. For  this reason, it can be an important part of a weight loss plan. Find a podcast or recording that provides guidance for breathing awareness, body scan, or meditation exercises. You can listen to these any time when you have a free moment to rest without distractions. Follow your treatment plan as told by your health care provider. This may include taking regular medicines and making  changes to your diet or lifestyle as recommended. Where to find more information You can find more information about MBSR from: Your health care provider. Community-based meditation centers or programs. Programs offered near you. Summary Mindfulness-based stress reduction (MBSR) is a program that teaches you how to consciously pay attention to the present moment. It is used to help you deal better with daily stress, feelings, and pain. MBSR focuses on developing self-awareness, which allows you to respond to life stress without judgment or negative feelings. MBSR programs may involve learning different mindfulness practices, such as breathing exercises, meditation, yoga, body scan, or mindful eating. Find a mindfulness practice that works best for you, and set aside time for it on a regular basis. This information is not intended to replace advice given to you by your health care provider. Make sure you discuss any questions you have with your health care provider. Document Revised: 08/13/2020 Document Reviewed: 08/13/2020 Elsevier Patient Education  2022 [employer]

## 2021-02-23 NOTE — Progress Notes (Signed)
Chief Complaint  Patient presents with   Follow-up   6 month f/u  1. Anxiety phq 9 score 10 and GAD 7 score 19 today sleeping ok on trazadone 100 mg qhs for sleep but taking this more frequently worried about everything but she used to be able to control this  2. Lower leg pain worse x 1 year with h/o varicose and spider veins will call back when ready to see [facility]  Pain 3-4/10 worse with pedicure massge  3. S/p right hip surgery, left knee arthritis, left hip issues and left hamstring tear, scoliosis in back and stenosis  S/p right hip surgery right thigh is numb and painful to deep touch but this has improved and Dr. Rudene Christians states should improve over 1 year will f/u with him    Review of Systems  Constitutional:  Negative for weight loss.  HENT:  Negative for hearing loss.   Eyes:  Negative for blurred vision.  Respiratory:  Negative for shortness of breath.   Cardiovascular:  Negative for chest pain.  Gastrointestinal:  Negative for abdominal pain and blood in stool.  Genitourinary:  Negative for dysuria.  Musculoskeletal:  Positive for back pain and joint pain. Negative for falls.  Skin:  Negative for rash.  Neurological:  Negative for headaches.  Psychiatric/Behavioral:  Negative for depression.   Past Medical History:  Diagnosis Date   AKI (acute kidney injury) ([place])    03/2020-04/2020   Allergy    i.e contact allergies [facility]; also plants, trees    Chicken pox    Depression    Diabetes mellitus without complication ([place])    DIET CONTROLLED   Family history of breast cancer    BRCA negative in the past   Family history of breast cancer    Family history of lung cancer    Family history of rectal cancer    History of prediabetes    Hypertension    Interstitial cystitis    Interstitial cystitis    Prediabetes    A1C 6.3 06/14/16   Spinal stenosis    UTI (urinary tract infection)    UTI (urinary tract infection)    Past Surgical History:   Procedure Laterality Date   CATARACT EXTRACTION Bilateral    b/l Dr. 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   Procedure: TOTAL HIP ARTHROPLASTY ANTERIOR APPROACH;  Surgeon: Hessie Knows, MD;  Location: [facility] ORS;  Service: Orthopedics;  Laterality: Right;   Family History  Problem Relation Age of Onset   Breast cancer Mother 74       s/p b/l masectomy    Breast cancer Sister 8   Breast cancer Maternal Aunt    Breast cancer Maternal Grandmother    Stroke Father        age 3    Stroke Other        grandmother age 56    Lupus Other    Other Daughter        benign brain tumor   Polycystic ovary syndrome Daughter    Social History   Socioeconomic History   Marital status: Widowed    Spouse name: Not on file   Number of children: Not on file   Years of education: Not on file   Highest education level: Not on file  Occupational History   Not on file  Tobacco Use   Smoking status: Former   Smokeless tobacco: Never   Tobacco comments:    smoked 4-5 years  in college then quit  Vaping Use   Vaping Use: Never used  Substance and Sexual Activity   Alcohol use: No   Drug use: No   Sexual activity: Not on file  Other Topics Concern   Not on file  Social History Narrative   1 daughter and 1 son    3 grandsons    Married to high school sweet heart (husband) x 36 years dx'ed with prostate cancer in 04-05-15 he died 06-04-2017    Retired Event organiser education [employer]   Social Determinants of Health   Financial Resource Strain: Low Risk    Difficulty of Paying Living Expenses: Not hard at all  Food Insecurity: No Food Insecurity   Worried About Charity fundraiser in the Last Year: Never true   Arboriculturist in the Last Year: Never true  Transportation Needs: No Transportation Needs   Lack of Transportation (Medical): No   Lack of Transportation (Non-Medical): No  Physical Activity: Not on file  Stress: No Stress Concern Present   Feeling of Stress :  Only a little  Social Connections: Unknown   Frequency of Communication with Friends and Family: More than three times a week   Frequency of Social Gatherings with Friends and Family: More than three times a week   Attends Religious Services: Not on Electrical engineer or Organizations: Yes   Attends Archivist Meetings: 1 to 4 times per year   Marital Status: Married  Human resources officer Violence: Not At Risk   Fear of Current or Ex-Partner: No   Emotionally Abused: No   Physically Abused: No   Sexually Abused: No   Current Meds  Medication Sig   acetaminophen (TYLENOL) 500 MG tablet Take 1,000 mg by mouth every 6 (six) hours as needed.   Azelastine-Fluticasone 137-50 MCG/ACT SUSP 1 spray bid   bisacodyl 5 MG EC tablet Take 5 mg by mouth daily as needed for moderate constipation. TAKE 1-3 AS NEEDED   cetirizine (ZYRTEC) 10 MG tablet Take by mouth.   Cholecalciferol (VITAMIN D3 PO) Take 2,000 Units by mouth.   citalopram (CELEXA) 10 MG tablet Take 1 tablet (10 mg total) by mouth daily.   cyanocobalamin 1000 MCG tablet Take 1,000 mcg by mouth daily.   docusate sodium (COLACE) 100 MG capsule Take 100 mg by mouth 2 (two) times daily.   lisinopril-hydrochlorothiazide (ZESTORETIC) 20-12.5 MG tablet Take 1 tablet by mouth daily. In am if BP too low <90<60 cut pill in 1/2   Multiple Vitamin (MULTIVITAMIN) capsule Take 1 capsule by mouth daily.   tiZANidine (ZANAFLEX) 2 MG tablet Take by mouth every 6 (six) hours as needed for muscle spasms.   traMADol (ULTRAM) 50 MG tablet Take 1 tablet (50 mg total) by mouth every 6 (six) hours as needed.   traZODone (DESYREL) 100 MG tablet TAKE 1 TABLET (100 MG TOTAL) BY MOUTH AT BEDTIME AS NEEDED FOR SLEEP.   Allergies  Allergen Reactions   Amlodipine Other (See Comments)    Leg swelling max dose   Leg swelling max dose    Nsaids     AKI    Benzalkonium Chloride Rash   Cetyl Alcohol Rash   Codeine Rash    PT STATES HAS TAKEN THIS  MED WITHOUT PROBLEMS   Lanolin Rash   Neomycin Rash   Nickel Rash   Propylene Glycol Rash   No results found for this or any previous  visit (from the past 2160 hour(s)). Objective  Body mass index is 28.6 kg/m. Wt Readings from Last 3 Encounters:  02/23/21 166 lb 9.6 oz (75.6 kg)  02/22/21 171 lb (77.6 kg)  09/15/20 171 lb 4.8 oz (77.7 kg)   Temp Readings from Last 3 Encounters:  02/23/21 98.6 F (37 C) (Oral)  09/17/20 99 F (37.2 C) (Oral)  08/21/20 98.4 F (36.9 C)   BP Readings from Last 3 Encounters:  02/23/21 120/64  09/17/20 98/72  09/07/20 125/64   Pulse Readings from Last 3 Encounters:  02/23/21 92  09/17/20 80  09/07/20 81    Physical Exam Vitals and nursing note reviewed.  Constitutional:      Appearance: Normal appearance. She is well-developed and well-groomed.  HENT:     Head: Normocephalic and atraumatic.  Eyes:     Conjunctiva/sclera: Conjunctivae normal.     Pupils: Pupils are equal, round, and reactive to light.  Cardiovascular:     Rate and Rhythm: Normal rate and regular rhythm.     Heart sounds: Normal heart sounds. No murmur heard. Pulmonary:     Effort: Pulmonary effort is normal.     Breath sounds: Normal breath sounds.  Abdominal:     General: Abdomen is flat. Bowel sounds are normal.     Tenderness: There is no abdominal tenderness.  Musculoskeletal:        General: No tenderness.  Skin:    General: Skin is warm and dry.  Neurological:     General: No focal deficit present.     Mental Status: She is alert and oriented to person, place, and time. Mental status is at baseline.     Cranial Nerves: Cranial nerves 2-12 are intact.     Motor: Motor function is intact.     Coordination: Coordination is intact.     Gait: Gait is intact.  Psychiatric:        Attention and Perception: Attention and perception normal.        Mood and Affect: Mood and affect normal.        Speech: Speech normal.        Behavior: Behavior normal.  Behavior is cooperative.        Thought Content: Thought content normal.        Cognition and Memory: Cognition and memory normal.        Judgment: Judgment normal.    Assessment  Plan  Anxiety - Plan: citalopram (CELEXA) 10 MG tablet Trazadone 100 mg qhs for sleep    Anemia, unspecified type - Plan: CBC with Differential/Platelet, IBC + Ferritin  Hypertension, controlled - Plan: Comprehensive metabolic panel, Lipid panel, CBC with Differential/Platelet Lis-hctz 20-12.5 mg qd    Varicose veins of both lower extremities with pain Spider veins Pain in both lower extremities Consider [facility] let me know if desired referral in the future   Arthritis, lumbar spine Abnormal MRI, lumbar spine Scoliosis of lumbar spine, unspecified scoliosis type Lumbar facet arthropathy Arthritis of left knee  F/u Dr. Luciano Cutter    HM Flu utd  Tdap had 07/22/15 pna 23 11/20/14 prevnar utd Consider shingrix in future will Rx to pharmacy when pt ready disc prev 4/4 covid 19 vaccines Hep B immune, MMR immune D3 5000 IU daily    Last pap 07/22/15 negative HPV neg out of age window    Mammogram 09/10/19 neg 07/31/18 genetics test + sister and 1/2 tested +nieces   -neg 11/09/20     Ordered dexa  cologaurd neg 12/30/16  and 02/12/20 neg    F/u [facility] had bx 07/2017 neg f/u in 1 year saw in 07/2018 and f/u in 1 year saw 07/2019 Hassan Rowan normal no bxs/lng   As of 02/23/21 needs to call to schedule appt Aks to face > left lower leg   Eye exam 05/2021 Dr. Merla Riches   rec healthy diet and exercise  Provider: Dr. Olivia Mackie McLean-[facility]ocuz[facility]

## 2021-03-25 ENCOUNTER — Ambulatory Visit: Payer: Medicare PPO | Admitting: Podiatry

## 2021-05-19 DIAGNOSIS — Z961 Presence of intraocular lens: Secondary | ICD-10-CM | POA: Diagnosis not present

## 2021-05-19 LAB — HM DIABETES EYE EXAM

## 2021-06-09 ENCOUNTER — Encounter: Payer: Self-pay | Admitting: Internal Medicine

## 2021-06-10 ENCOUNTER — Other Ambulatory Visit: Payer: Self-pay | Admitting: Internal Medicine

## 2021-06-10 DIAGNOSIS — L718 Other rosacea: Secondary | ICD-10-CM | POA: Diagnosis not present

## 2021-06-10 DIAGNOSIS — R937 Abnormal findings on diagnostic imaging of other parts of musculoskeletal system: Secondary | ICD-10-CM

## 2021-06-10 DIAGNOSIS — M47816 Spondylosis without myelopathy or radiculopathy, lumbar region: Secondary | ICD-10-CM

## 2021-06-10 DIAGNOSIS — M1712 Unilateral primary osteoarthritis, left knee: Secondary | ICD-10-CM

## 2021-06-10 DIAGNOSIS — D225 Melanocytic nevi of trunk: Secondary | ICD-10-CM | POA: Diagnosis not present

## 2021-06-10 DIAGNOSIS — X32XXXA Exposure to sunlight, initial encounter: Secondary | ICD-10-CM | POA: Diagnosis not present

## 2021-06-10 DIAGNOSIS — M1611 Unilateral primary osteoarthritis, right hip: Secondary | ICD-10-CM

## 2021-06-10 DIAGNOSIS — M1612 Unilateral primary osteoarthritis, left hip: Secondary | ICD-10-CM

## 2021-06-10 DIAGNOSIS — G8929 Other chronic pain: Secondary | ICD-10-CM

## 2021-06-10 DIAGNOSIS — M542 Cervicalgia: Secondary | ICD-10-CM

## 2021-06-10 DIAGNOSIS — L309 Dermatitis, unspecified: Secondary | ICD-10-CM | POA: Diagnosis not present

## 2021-06-10 DIAGNOSIS — L570 Actinic keratosis: Secondary | ICD-10-CM | POA: Diagnosis not present

## 2021-06-10 MED ORDER — TRAMADOL HCL 50 MG PO TABS: 50.0000 mg | ORAL_TABLET | Freq: Two times a day (BID) | ORAL | 0 refills | Status: DC | PRN

## 2021-06-10 NOTE — Telephone Encounter (Signed)
Pt will need to sign pain contract for tramadol

## 2021-09-07 ENCOUNTER — Encounter: Payer: Self-pay | Admitting: Internal Medicine

## 2021-09-07 ENCOUNTER — Telehealth: Payer: Self-pay | Admitting: Internal Medicine

## 2021-09-07 ENCOUNTER — Ambulatory Visit (INDEPENDENT_AMBULATORY_CARE_PROVIDER_SITE_OTHER): Payer: Medicare PPO | Admitting: Internal Medicine

## 2021-09-07 VITALS — BP 122/64 | HR 76 | Temp 98.3°F | Ht 64.0 in | Wt 176.2 lb

## 2021-09-07 DIAGNOSIS — E785 Hyperlipidemia, unspecified: Secondary | ICD-10-CM

## 2021-09-07 DIAGNOSIS — M542 Cervicalgia: Secondary | ICD-10-CM | POA: Diagnosis not present

## 2021-09-07 DIAGNOSIS — R937 Abnormal findings on diagnostic imaging of other parts of musculoskeletal system: Secondary | ICD-10-CM | POA: Diagnosis not present

## 2021-09-07 DIAGNOSIS — J309 Allergic rhinitis, unspecified: Secondary | ICD-10-CM

## 2021-09-07 DIAGNOSIS — M1712 Unilateral primary osteoarthritis, left knee: Secondary | ICD-10-CM | POA: Diagnosis not present

## 2021-09-07 DIAGNOSIS — G8929 Other chronic pain: Secondary | ICD-10-CM | POA: Diagnosis not present

## 2021-09-07 DIAGNOSIS — M1612 Unilateral primary osteoarthritis, left hip: Secondary | ICD-10-CM | POA: Diagnosis not present

## 2021-09-07 DIAGNOSIS — I10 Essential (primary) hypertension: Secondary | ICD-10-CM | POA: Diagnosis not present

## 2021-09-07 DIAGNOSIS — M1611 Unilateral primary osteoarthritis, right hip: Secondary | ICD-10-CM

## 2021-09-07 DIAGNOSIS — L719 Rosacea, unspecified: Secondary | ICD-10-CM

## 2021-09-07 DIAGNOSIS — Z23 Encounter for immunization: Secondary | ICD-10-CM

## 2021-09-07 DIAGNOSIS — R0602 Shortness of breath: Secondary | ICD-10-CM

## 2021-09-07 DIAGNOSIS — R739 Hyperglycemia, unspecified: Secondary | ICD-10-CM | POA: Diagnosis not present

## 2021-09-07 DIAGNOSIS — M25552 Pain in left hip: Secondary | ICD-10-CM

## 2021-09-07 DIAGNOSIS — M47816 Spondylosis without myelopathy or radiculopathy, lumbar region: Secondary | ICD-10-CM | POA: Diagnosis not present

## 2021-09-07 DIAGNOSIS — F419 Anxiety disorder, unspecified: Secondary | ICD-10-CM

## 2021-09-07 DIAGNOSIS — G4700 Insomnia, unspecified: Secondary | ICD-10-CM

## 2021-09-07 DIAGNOSIS — Z1231 Encounter for screening mammogram for malignant neoplasm of breast: Secondary | ICD-10-CM

## 2021-09-07 MED ORDER — TRAZODONE HCL 100 MG PO TABS: 100.0000 mg | ORAL_TABLET | Freq: Every evening | ORAL | 3 refills | Status: DC | PRN

## 2021-09-07 MED ORDER — TRAMADOL HCL 50 MG PO TABS: 50.0000 mg | ORAL_TABLET | Freq: Two times a day (BID) | ORAL | 2 refills | Status: DC | PRN

## 2021-09-07 MED ORDER — DOCUSATE SODIUM 100 MG PO CAPS: 100.0000 mg | ORAL_CAPSULE | Freq: Two times a day (BID) | ORAL | 3 refills | Status: AC | PRN

## 2021-09-07 MED ORDER — AZELASTINE-FLUTICASONE 137-50 MCG/ACT NA SUSP: NASAL | 11 refills | Status: DC

## 2021-09-07 MED ORDER — DOXYCYCLINE HYCLATE 20 MG PO TABS: 20.0000 mg | ORAL_TABLET | Freq: Two times a day (BID) | ORAL | 2 refills | Status: DC

## 2021-09-07 MED ORDER — LISINOPRIL-HYDROCHLOROTHIAZIDE 20-12.5 MG PO TABS: 1.0000 | ORAL_TABLET | Freq: Every day | ORAL | 3 refills | Status: DC

## 2021-09-07 MED ORDER — SHINGRIX 50 MCG/0.5ML IM SUSR: 0.5000 mL | Freq: Once | INTRAMUSCULAR | 1 refills | Status: AC

## 2021-09-07 MED ORDER — CITALOPRAM HYDROBROMIDE 10 MG PO TABS: 10.0000 mg | ORAL_TABLET | Freq: Every day | ORAL | 3 refills | Status: DC

## 2021-09-07 NOTE — Patient Instructions (Addendum)
Consider prevnar 20 09/14/22  Consider covid booster fall 2023  Consider flu shot 09/2021 here pr pharmacy   Schedule mammogram and bone density same day     Phone Fax E-mail Address  [phone] Not available [place]   [place] [id number]     Specialties     Cardiology       Doxycycline Capsules or Tablets What is this medication? DOXYCYCLINE (dox i SYE kleen) treats infections caused by bacteria. It belongs to a group of medications called tetracycline antibiotics. It will not treat colds, the flu, or infections caused by viruses. This medicine may be used for other purposes; ask your health care provider or pharmacist if you have questions. COMMON BRAND NAME(S): Acticlate, Adoxa, Adoxa CK, Adoxa Pak, Adoxa TT, Alodox, Avidoxy, Doxal, LYMEPAK, Mondoxyne NL, Monodox, Morgidox 1x, Morgidox 1x Kit, Morgidox 2x, Morgidox 2x Kit, NutriDox, Ocudox, Venedocia, [place], [place], Vibra-Tabs, Vibramycin What should I tell my care team before I take this medication? They need to know if you have any of these conditions: Kidney disease Liver disease Long exposure to sunlight like working outdoors Recent stomach surgery Stomach or intestine problems such as colitis Vision Problems Yeast or fungal infection of the mouth or vagina An unusual or allergic reaction to doxycycline, tetracycline antibiotics, other medications, foods, dyes, or preservatives Pregnant or trying to get pregnant Breast-feeding How should I use this medication? Take this medication by mouth with water. Take it as directed on the prescription label at the same time every day. It is best to take this medication without food, but if it upsets your stomach take it with food. Take all of this medication unless your care team tells you to stop it early. Keep taking it even if you think you are better. Take antacids and products with aluminum, calcium, magnesium, iron, and zinc in them at a  different time of day than this medication. Talk to your care team if you have questions. Talk to your care team regarding the use of this medication in children. While this medication may be prescribed for selected conditions, precautions do apply. Overdosage: If you think you have taken too much of this medicine contact a poison control center or emergency room at once. NOTE: This medicine is only for you. Do not share this medicine with others. What if I miss a dose? If you miss a dose, take it as soon as you can. If it is almost time for your next dose, take only that dose. Do not take double or extra doses. What may interact with this medication? Antacids, vitamins, or other products that contain aluminum, calcium, iron, magnesium, or zinc Barbiturates Birth control pills Bismuth subsalicylate Carbamazepine Methoxyflurane Oral retinoids such as acitretin, isotretinoin Other antibiotics Phenytoin Warfarin This list may not describe all possible interactions. Give your health care provider a list of all the medicines, herbs, non-prescription drugs, or dietary supplements you use. Also tell them if you smoke, drink alcohol, or use illegal drugs. Some items may interact with your medicine. What should I watch for while using this medication? Tell your care team if your symptoms do not improve. Do not treat diarrhea with over the counter products. Contact your care team if you have diarrhea that lasts more than 2 days or if it is severe and watery. Do not take this medication just before going to bed. It may not dissolve properly when you lay down and can cause pain in your throat.  Drink plenty of fluids while taking this medication to also help reduce irritation in your throat. This medication can make you more sensitive to the sun. Keep out of the sun. If you cannot avoid being in the sun, wear protective clothing and use sunscreen. Do not use sun lamps or tanning beds/booths. Birth control  pills may not work properly while you are taking this medication. Talk to your care team about using an extra method of birth control. If you are being treated for a sexually transmitted infection, avoid sexual contact until you have finished your treatment. Your sexual partner may also need treatment. If you are using this medication to prevent malaria, you should still protect yourself from contact with mosquitos. Stay in screened-in areas, use mosquito nets, keep your body covered, and use an insect repellent. What side effects may I notice from receiving this medication? Side effects that you should report to your care team as soon as possible: Allergic reactions--skin rash, itching, hives, swelling of the face, lips, tongue, or throat Increased pressure around the brain--severe headache, change in vision, blurry vision, nausea, vomiting Joint pain Pain or trouble swallowing Redness, blistering, peeling, or loosening of the skin, including inside the mouth Severe diarrhea, fever Unusual vaginal discharge, itching, or odor Side effects that usually do not require medical attention (report these to your care team if they continue or are bothersome): Change in tooth color Diarrhea Headache Heartburn Nausea This list may not describe all possible side effects. Call your doctor for medical advice about side effects. You may report side effects to FDA at 1-800-FDA-1088. Where should I keep my medication? Keep out of the reach of children and pets. Store at room temperature, below 30 degrees C (86 degrees F). Protect from light. Keep container tightly closed. Throw away any unused medication after the expiration date. Taking this medication after the expiration date can make you seriously ill. NOTE: This sheet is a summary. It may not cover all possible information. If you have questions about this medicine, talk to your doctor, pharmacist, or health care provider.  2023 Elsevier/Gold Standard  (2020-01-31 00:00:00)  Rosacea Rosacea is a long-term (chronic) condition that affects the skin of the face, including the cheeks, nose, forehead, and chin. This condition can also affect the eyes. Rosacea causes blood vessels near the surface of the skin to enlarge, which results in redness. What are the causes? The cause of this condition is not known. Certain triggers can make rosacea worse, including: Exercise. Sunlight. Very hot or cold temperatures. Hot or spicy foods and drinks. Drinking alcohol. Stress. Taking blood pressure medicine. Long-term use of topical steroids on the face. What increases the risk? You are more likely to develop this condition if you: Are older than 73 years of age. Are a woman. Have light-colored skin (light complexion). Have a family history of rosacea. What are the signs or symptoms? Symptoms of this condition include: Redness of the face. Red bumps or pimples on the face. A red, enlarged nose. Blushing easily. Red lines on the skin. Eye problems such as: Irritated, burning, or itchy feeling in the eyes. Swollen eyelids. Drainage from the eyes. Feeling like there is something in your eye. How is this diagnosed? This condition is diagnosed with a medical history and physical exam. How is this treated? There is no cure for this condition, but treatment can help to control your symptoms. Your health care provider may recommend that you see a skin specialist (dermatologist). Treatment may include: Medicines  that are applied to the skin or taken by mouth (orally). This can include antibiotic medicines. Laser treatment to improve the appearance of the skin. Surgery. This is rare. Your health care provider will also recommend the best way to take care of your skin. Even after your skin improves, you will likely need to continue treatment to prevent your rosacea from coming back. Follow these instructions at home: Skin care Take care of your skin  as told by your health care provider. You may be told to do these things: Wash your skin gently two or more times each day. Use mild soap. Use a sunscreen or sunblock with SPF 30 or greater. Use gentle cosmetics that are meant for sensitive skin. Shave with an electric shaver instead of a blade. Lifestyle Try to keep track of what foods trigger this condition. Avoid any triggers. These may include: Spicy foods. Seafood. Cheese. Hot liquids. Nuts. Chocolate. Iodized salt. Do not drink alcohol. Avoid extremely cold or hot temperatures. Try to reduce your stress. If you need help, talk with your health care provider. When you exercise, do these things to stay cool: Limit sun exposure to your face. Use a fan. Do shorter and more frequent intervals of exercise. General instructions Take and apply over-the-counter and prescription medicines only as told by your health care provider. If you were prescribed antibiotics, apply it or take them as told by your health care provider. Do not stop using the antibiotic even if your condition improves. If your eyelids are affected, apply warm compresses to them. Do this as told by your health care provider. Keep all follow-up visits. Contact a health care provider if: Your symptoms get worse. Your symptoms do not improve after 2 months of treatment. You have new symptoms. You have any changes in vision or you have problems with your eyes, such as redness or itching. You feel depressed. You lose your appetite. You have trouble concentrating. Summary Rosacea is a long-term (chronic) condition that affects the skin of the face, including the cheeks, nose, forehead, and chin. Take care of your skin as told by your health care provider. Take and apply over-the-counter and prescription medicines only as told by your health care provider. Contact a health care provider if your symptoms get worse or if you have any changes in vision or other problems  with your eyes, such as redness or itching. Keep all follow-up visits. This information is not intended to replace advice given to you by your health care provider. Make sure you discuss any questions you have with your health care provider. Document Revised: 02/24/2021 Document Reviewed: 02/24/2021 Elsevier Patient Education  [place].

## 2021-09-07 NOTE — Telephone Encounter (Signed)
Patient would like to do a TOC with Dr Lorin Picket since Dr French Ana will be leaving.

## 2021-09-07 NOTE — Progress Notes (Unsigned)
Chief Complaint  Patient presents with   Follow-up    6 month f/u   HPI ROS Past Medical History:  Diagnosis Date   AKI (acute kidney injury) ([place])    03/2020-04/2020   Allergy    i.e contact allergies [facility]; also plants, trees    Chicken pox    Depression    Diabetes mellitus without complication ([place])    DIET CONTROLLED   Family history of breast cancer    BRCA negative in the past   Family history of breast cancer    Family history of lung cancer    Family history of rectal cancer    History of prediabetes    Hypertension    Interstitial cystitis    Interstitial cystitis    Prediabetes    A1C 6.3 06/14/16   Spinal stenosis    UTI (urinary tract infection)    UTI (urinary tract infection)    Past Surgical History:  Procedure Laterality Date   CATARACT EXTRACTION Bilateral    b/l Dr. 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   Procedure: TOTAL HIP ARTHROPLASTY ANTERIOR APPROACH;  Surgeon: Hessie Knows, MD;  Location: [facility] ORS;  Service: Orthopedics;  Laterality: Right;   Family History  Problem Relation Age of Onset   Breast cancer Mother 65       s/p b/l masectomy    Breast cancer Sister 78   Breast cancer Maternal Aunt    Breast cancer Maternal Grandmother    Stroke Father        age 44    Stroke Other        grandmother age 31    Lupus Other    Other Daughter        benign brain tumor   Polycystic ovary syndrome Daughter    Social History   Socioeconomic History   Marital status: Widowed    Spouse name: Not on file   Number of children: Not on file   Years of education: Not on file   Highest education level: Not on file  Occupational History   Not on file  Tobacco Use   Smoking status: Former   Smokeless tobacco: Never   Tobacco comments:    smoked 4-5 years in college then quit  Vaping Use   Vaping Use: Never used  Substance and Sexual Activity   Alcohol use: No   Drug use: No   Sexual activity: Not on file   Other Topics Concern   Not on file  Social History Narrative   1 daughter and 1 son    3 grandsons    Married to high school sweet heart (husband) x 39 years dx'ed with prostate cancer in 04/04/15 he died 03-Jun-2017    Retired Event organiser education [employer]   Social Determinants of Health   Financial Resource Strain: [place]  (02/22/2021)   Overall Financial Resource Strain (CARDIA)    Difficulty of Paying Living Expenses: Not hard at all  Food Insecurity: No [place] (02/22/2021)   Hunger Vital Sign    Worried About [place] in the Last Year: Never true    [place]ct[place] in the Last Year: Never true  Transportation Needs: No Transportation Needs (02/22/2021)   PRAPARE - Hydrologist (Medical): No    Lack of Transportation (Non-Medical): No  Physical Activity: Insufficiently Active (02/20/2020)   Exercise Vital Sign  Days of Exercise per Week: 1 day    Minutes of Exercise per Session: 60 min  Stress: No Stress Concern Present (02/22/2021)   [place]    Feeling of Stress : Only a little  Social Connections: Unknown (02/22/2021)   Social Connection and Isolation Panel [NHANES]    Frequency of Communication with Friends and Family: More than three times a week    Frequency of Social Gatherings with Friends and Family: More than three times a week    Attends Religious Services: Not on file    Active Member of Clubs or Organizations: Yes    Attends Archivist Meetings: 1 to 4 times per year    Marital Status: Married  Human resources officer Violence: Not At Risk (02/22/2021)   Humiliation, Afraid, Rape, and Kick questionnaire    Fear of Current or Ex-Partner: No    Emotionally Abused: No    Physically Abused: No    Sexually Abused: No   Current Meds  Medication Sig   acetaminophen (TYLENOL) 500 MG tablet Take 1,000 mg by mouth every 6 (six) hours as needed.    Azelastine-Fluticasone 137-50 MCG/ACT SUSP 1 spray bid   bisacodyl 5 MG EC tablet Take 5 mg by mouth daily as needed for moderate constipation. TAKE 1-3 AS NEEDED   Cholecalciferol (VITAMIN D3 PO) Take 2,000 Units by mouth.   citalopram (CELEXA) 10 MG tablet Take 1 tablet (10 mg total) by mouth daily.   cyanocobalamin 1000 MCG tablet Take 1,000 mcg by mouth daily.   docusate sodium (COLACE) 100 MG capsule Take 100 mg by mouth 2 (two) times daily.   lisinopril-hydrochlorothiazide (ZESTORETIC) 20-12.5 MG tablet Take 1 tablet by mouth daily. In am if BP too low <90<60 cut pill in 1/2   Multiple Vitamin (MULTIVITAMIN) capsule Take 1 capsule by mouth daily.   tiZANidine (ZANAFLEX) 2 MG tablet Take by mouth every 6 (six) hours as needed for muscle spasms.   traMADol (ULTRAM) 50 MG tablet Take 1 tablet (50 mg total) by mouth every 12 (twelve) hours as needed. Change to 60   traZODone (DESYREL) 100 MG tablet Take 1 tablet (100 mg total) by mouth at bedtime as needed for sleep.   Allergies  Allergen Reactions   Amlodipine Other (See Comments)    Leg swelling max dose   Leg swelling max dose    Nsaids     AKI    Benzalkonium Chloride Rash   Cetyl Alcohol Rash   Codeine Rash    PT STATES HAS TAKEN THIS MED WITHOUT PROBLEMS   Lanolin Rash   Neomycin Rash   Nickel Rash   Propylene Glycol Rash   No results found for this or any previous visit (from the past 2160 hour(s)). Objective  Body mass index is 30.24 kg/m. Wt Readings from Last 3 Encounters:  09/07/21 176 lb 3.2 oz (79.9 kg)  02/23/21 166 lb 9.6 oz (75.6 kg)  02/22/21 171 lb (77.6 kg)   Temp Readings from Last 3 Encounters:  09/07/21 98.3 F (36.8 C) (Oral)  02/23/21 98.6 F (37 C) (Oral)  09/17/20 99 F (37.2 C) (Oral)   BP Readings from Last 3 Encounters:  09/07/21 122/64  02/23/21 120/64  09/17/20 98/72   Pulse Readings from Last 3 Encounters:  09/07/21 76  02/23/21 92  09/17/20 80    Physical Exam  Assessment   Plan  No diagnosis found.   HM Flu utd  Tdap had  07/22/15 pna 23 11/20/14 prevnar utd Consider prevnar 20 09/14/22   Consider shingrix in future will Rx to pharmacy when pt ready disc prev 4/4 covid 19 vaccines Hep B immune, MMR immune D3 5000 IU daily    Last pap 07/22/15 negative HPV neg out of age window    Mammogram 09/10/19 neg 07/31/18 genetics test + sister and 1/2 tested +nieces   -neg 11/09/20 ordered     Ordered dexa   cologaurd neg 12/30/16  and 02/12/20 neg    F/u [facility] had bx 07/2017 neg f/u in 1 year saw in 07/2018 and f/u in 1 year saw 07/2019 Hassan Rowan normal no bxs/lng   As of 02/23/21 needs to call to schedule appt Aks to face > left lower leg    Eye exam 05/2021 Dr. Merla Riches    rec healthy diet and exercise  Provider: Dr. Olivia Mackie McLean-Scocuzza-Internal Medicine

## 2021-09-08 ENCOUNTER — Other Ambulatory Visit: Payer: Medicare PPO

## 2021-09-08 ENCOUNTER — Ambulatory Visit (INDEPENDENT_AMBULATORY_CARE_PROVIDER_SITE_OTHER): Payer: Medicare PPO

## 2021-09-08 DIAGNOSIS — R0602 Shortness of breath: Secondary | ICD-10-CM | POA: Diagnosis not present

## 2021-09-08 LAB — COMPREHENSIVE METABOLIC PANEL
ALT: 14 U/L (ref 0–35)
AST: 16 U/L (ref 0–37)
Albumin: 4.3 g/dL (ref 3.5–5.2)
Alkaline Phosphatase: 67 U/L (ref 39–117)
BUN: 16 mg/dL (ref 6–23)
CO2: 29 mEq/L (ref 19–32)
Calcium: 9.5 mg/dL (ref 8.4–10.5)
Chloride: 99 mEq/L (ref 96–112)
Creatinine, Ser: 1 mg/dL (ref 0.40–1.20)
GFR: 56.02 mL/min — ABNORMAL LOW (ref >60.00)
Glucose, Bld: 92 mg/dL (ref 70–99)
Potassium: 3.7 mEq/L (ref 3.5–5.1)
Sodium: 136 mEq/L (ref 135–145)
Total Bilirubin: 0.8 mg/dL (ref 0.2–1.2)
Total Protein: 6.9 g/dL (ref 6.0–8.3)

## 2021-09-08 LAB — LIPID PANEL
Cholesterol: 184 mg/dL (ref 0–200)
HDL: 58.2 mg/dL (ref >39.00)
LDL Cholesterol: 103 mg/dL — ABNORMAL HIGH (ref 0–99)
NonHDL: 125.7
Total CHOL/HDL Ratio: 3
Triglycerides: 113 mg/dL (ref 0.0–149.0)
VLDL: 22.6 mg/dL (ref 0.0–40.0)

## 2021-09-08 LAB — CBC WITH DIFFERENTIAL/PLATELET
Basophils Absolute: 0.1 10*3/uL (ref 0.0–0.1)
Basophils Relative: 1.2 % (ref 0.0–3.0)
Eosinophils Absolute: 0.2 10*3/uL (ref 0.0–0.7)
Eosinophils Relative: 2.6 % (ref 0.0–5.0)
HCT: 36.2 % (ref 36.0–46.0)
Hemoglobin: 11.9 g/dL — ABNORMAL LOW (ref 12.0–15.0)
Lymphocytes Relative: 19.2 % (ref 12.0–46.0)
Lymphs Abs: 1.3 10*3/uL (ref 0.7–4.0)
MCHC: 32.8 g/dL (ref 30.0–36.0)
MCV: 85.2 fl (ref 78.0–100.0)
Monocytes Absolute: 0.6 10*3/uL (ref 0.1–1.0)
Monocytes Relative: 8.6 % (ref 3.0–12.0)
Neutro Abs: 4.8 10*3/uL (ref 1.4–7.7)
Neutrophils Relative %: 68.4 % (ref 43.0–77.0)
Platelets: 276 10*3/uL (ref 150.0–400.0)
RBC: 4.25 Mil/uL (ref 3.87–5.11)
RDW: 13.8 % (ref 11.5–15.5)
WBC: 7 10*3/uL (ref 4.0–10.5)

## 2021-09-08 LAB — HEMOGLOBIN A1C: Hgb A1c MFr Bld: 6.6 % — ABNORMAL HIGH (ref 4.6–6.5)

## 2021-09-08 NOTE — Telephone Encounter (Signed)
Duplicate.  See note.  

## 2021-09-10 ENCOUNTER — Telehealth: Payer: Self-pay

## 2021-09-10 NOTE — Telephone Encounter (Signed)
Spoke with pt in regards to Chest xray she verbalized understanding and she has an appt with her cardiologist on 10/25.   Pt also expressed concern about Dr. French Ana referring her to Dr. Lorin Picket to make a February appt. Pt stated when she called the front desk they told her she is not accepting pts. Pt wants to know what to do from there who should she see or if Dr, French Ana can put in a good word so Dr. Lorin Picket can make an exception.

## 2021-09-10 NOTE — Telephone Encounter (Signed)
Called and spoke with pt in regards to our first convo:   McLean-Scocuzza, Pasty Spillers, MD  You; Dale Wolsey, MD 3 hours ago (12:41 PM)   TM Discussed Dr. Clent Ridges pt wants to see Dr. Lorin Picket is she agreeable?        Note    You routed conversation to McLean-Scocuzza, Pasty Spillers, MD 5 hours ago (10:46 AM)   You 5 hours ago (10:46 AM)    Spoke with pt in regards to Chest xray she verbalized understanding and she has an appt with her cardiologist on 10/25.    Pt also expressed concern about Dr. French Ana referring her to Dr. Lorin Picket to make a February appt. Pt stated when she called the front desk they told her she is not accepting pts. Pt wants to know what to do from there who should she see or if Dr, French Ana can put in a good word so Dr. Lorin Picket can make an exception.       Note    You  Cela, [place] [phone]    Pt has decided to try Dr. Clent Ridges. I scheduled pt for her 6 month f/u and a TOC on 03/10/2022 @ 1 pm

## 2021-09-10 NOTE — Telephone Encounter (Signed)
Discussed Dr. Clent Ridges pt wants to see Dr. Lorin Picket is she agreeable?

## 2021-09-11 NOTE — Telephone Encounter (Signed)
I am ok either way

## 2021-09-13 ENCOUNTER — Encounter: Payer: Self-pay | Admitting: Internal Medicine

## 2021-09-13 NOTE — Telephone Encounter (Signed)
Inform pt Dr. Clent Ridges ok 1st visit if she wants to stay with her ok Dr. Lorin Picket responded and agreed to see her as well for transfer of care  Schedule with Dr. Lorin Picket and cancel with Dr. Clent Ridges if pt desires

## 2021-09-30 ENCOUNTER — Other Ambulatory Visit: Payer: Self-pay | Admitting: Internal Medicine

## 2021-09-30 DIAGNOSIS — L719 Rosacea, unspecified: Secondary | ICD-10-CM

## 2021-11-10 ENCOUNTER — Other Ambulatory Visit
Admission: RE | Admit: 2021-11-10 | Discharge: 2021-11-10 | Disposition: A | Discharge status: Home / Self Care | Payer: Medicare PPO | Source: Ambulatory Visit | Attending: Internal Medicine | Admitting: Internal Medicine

## 2021-11-10 ENCOUNTER — Encounter: Payer: Self-pay | Admitting: Internal Medicine

## 2021-11-10 ENCOUNTER — Ambulatory Visit: Payer: Medicare PPO | Attending: Internal Medicine | Admitting: Internal Medicine

## 2021-11-10 VITALS — BP 120/70 | HR 77 | Ht 64.0 in | Wt 174.0 lb

## 2021-11-10 DIAGNOSIS — M79604 Pain in right leg: Secondary | ICD-10-CM

## 2021-11-10 DIAGNOSIS — R9431 Abnormal electrocardiogram [ECG] [EKG]: Secondary | ICD-10-CM | POA: Diagnosis not present

## 2021-11-10 DIAGNOSIS — I2089 Other forms of angina pectoris: Secondary | ICD-10-CM | POA: Insufficient documentation

## 2021-11-10 DIAGNOSIS — M79605 Pain in left leg: Secondary | ICD-10-CM

## 2021-11-10 DIAGNOSIS — R0609 Other forms of dyspnea: Secondary | ICD-10-CM

## 2021-11-10 LAB — BASIC METABOLIC PANEL
Anion gap: 9 (ref 5–15)
BUN: 22 mg/dL (ref 8–23)
CO2: 27 mmol/L (ref 22–32)
Calcium: 9.8 mg/dL (ref 8.9–10.3)
Chloride: 103 mmol/L (ref 98–111)
Creatinine, Ser: 1.05 mg/dL — ABNORMAL HIGH (ref 0.44–1.00)
GFR, Estimated: 56 mL/min — ABNORMAL LOW (ref >60)
Glucose, Bld: 119 mg/dL — ABNORMAL HIGH (ref 70–99)
Potassium: 4.1 mmol/L (ref 3.5–5.1)
Sodium: 139 mmol/L (ref 135–145)

## 2021-11-10 MED ORDER — METOPROLOL TARTRATE 100 MG PO TABS: 100.0000 mg | ORAL_TABLET | Freq: Once | ORAL | 0 refills | Status: DC

## 2021-11-10 NOTE — Patient Instructions (Signed)
Medication Instructions:   Your physician recommends that you continue on your current medications as directed. Please refer to the Current Medication list given to you today.  *If you need a refill on your cardiac medications before your next appointment, please call your pharmacy*   Lab Work:  Please go to the [facility] after your appointment today for a BMP lab draw.   Testing/Procedures:  Your physician has requested that you have an echocardiogram. Echocardiography is a painless test that uses sound waves to create images of your heart. It provides your doctor with information about the size and shape of your heart and how well your heart's chambers and valves are working. This procedure takes approximately one hour. There are no restrictions for this procedure. Please do NOT wear cologne, perfume, aftershave, or lotions (deodorant is allowed). Please arrive 15 minutes prior to your appointment time.  2.   Your physician has requested that you have a lower extremity arterial duplex. During this test, ultrasound is used to evaluate arterial blood flow in the legs. Allow one hour for this exam. There are no restrictions or special instructions.   3.   Your physician has requested that you have an ankle brachial index (ABI). During this test an ultrasound and blood pressure cuff are used to evaluate the arteries that supply the arms and legs with blood. Allow thirty minutes for this exam. There are no restrictions or special instructions.   4.  Your physician has requested that you have cardiac CT. Cardiac computed tomography (CT) is a painless test that uses an x-ray machine to take clear, detailed pictures of your heart.    Your cardiac CT will be scheduled at one of the following locations:  [facility] [place], [place] [address] (336) [facility] [place], [place] [address] [phone]  Please arrive 15 mins early for check-in and test prep.    Please follow these instructions carefully (unless otherwise directed):    Night Before the Test: Be sure to Drink plenty of water. Do not consume any caffeinated/decaffeinated beverages or chocolate 12 hours prior to your test.    On the Day of the Test: Drink plenty of water until 1 hour prior to the test. Do not eat any food 4 hours prior to the test. You may take your regular medications prior to the test.  Take metoprolol (Lopressor) 100 MG two hours prior to test. HOLD your lisinopril-hydrochlorothiazide morning of the test. FEMALES- please wear underwire-free bra if available, avoid dresses & tight clothing        After the Test: Drink plenty of water. After receiving IV contrast, you may experience a mild flushed feeling. This is normal. On occasion, you may experience a mild rash up to 24 hours after the test. This is not dangerous. If this occurs, you can take Benadryl 25 mg and increase your fluid intake. If you experience trouble breathing, this can be serious. If it is severe call 911 IMMEDIATELY. If it is mild, please call our office. If you take any of these medications: Glipizide/Metformin, Avandament, Glucavance, please do not take 48 hours after completing test unless otherwise instructed.  Please allow 2-4 weeks for scheduling of routine cardiac CTs. Some insurance companies require a pre-authorization which may delay scheduling of this test.   For non-scheduling related questions, please contact the cardiac imaging nurse navigator should you have any questions/concerns:  Rockwell Alexandria, Cardiac Imaging Nurse Navigator Larey Brick, Cardiac Imaging Nurse Navigator [facility] Direct Office Dial: [phone]   For scheduling needs, including cancellations and rescheduling, please call [place], [phone].       Follow-Up: At [facility], you and your health needs are our priority.  As part of our continuing mission to provide you with exceptional heart care, we have created designated Provider Care Teams.  These Care Teams include your primary Cardiologist (physician) and Advanced Practice Providers (APPs -  Physician Assistants and Nurse Practitioners) who all work together to provide you with the care you need, when you need it.  We recommend signing up for the patient portal called "MyChart".  Sign up information is provided on this After Visit Summary.  MyChart is used to connect with patients for Virtual Visits (Telemedicine).  Patients are able to view lab/test results, encounter notes, upcoming appointments, etc.  Non-urgent messages can be sent to your provider as well.   To learn more about what you can do with MyChart, go to [email or web address].    Your next appointment:   Follow up after testing   The format for your next appointment:   In Person  Provider:   You may see [name], MD or one of the following Advanced Practice Providers on your designated Care Team:   Nicolasa Ducking, NP Eula Listen, PA-C Cadence Fransico Michael, PA-C Charlsie Quest, NP    Other Instructions   Important Information About Sugar

## 2021-11-10 NOTE — Progress Notes (Unsigned)
New Outpatient Visit Date: 11/10/2021  Referring Provider: McLean-Scocuzza, Nino Glow, MD No address on file  Chief Complaint: Dyspnea on exertion  HPI:  Tabitha Lewis is a 71 y.o. female who is being seen today for the evaluation of dyspnea on exertion at the request of Dr. Terese Door.  She has a history of hypertension, diet-controlled type 2 diabetes mellitus, and acute kidney injury.  She saw Dr. Terese Door in late August, at which time she noted some exertional dyspnea.  [name] reports that she has had fatigue and exertional dyspnea ever since her right hip replacement about a year ago.  She feels like the symptoms have plateaued but have not improved much as she has tried to increase her activity.  She is also still somewhat limited due to the left knee pain.  She denies chest pain and leg swelling as well as orthopnea and PND.  Her exertional dyspnea is sometimes associated with brief palpitations.  She has not felt lightheaded or passed out.  [name] denies a history of heart disease and prior heart testing though she is concerned by her mother's history of MI in her late 41s and subsequent heart failure.  Her maternal grandmother also had a stroke in her early 11s.  [name] reports sporadic pain in and numbness in her lower extremities, particularly on the right side below the hip.  She attributes some of this to nerve damage possibly related to back problems and/or right hip surgery last year.  --------------------------------------------------------------------------------------------------  Cardiovascular History & Procedures: Cardiovascular Problems: Dyspnea on exertion  Risk Factors: Hypertension, type 2 diabetes mellitus, obesity, prior tobacco use, and age greater than 44  Cath/PCI: None  CV Surgery: None  EP Procedures and Devices: None  Non-Invasive Evaluation(s): None  Recent CV Pertinent Labs: Lab Results  Component Value Date    CHOL 184 09/07/2021   HDL 58.20 09/07/2021   LDLCALC 103 (H) 09/07/2021   LDLCALC 92 12/23/2016   TRIG 113.0 09/07/2021   CHOLHDL 3 09/07/2021   K 4.1 11/10/2021   MG 1.7 03/25/2020   BUN 22 11/10/2021   CREATININE 1.05 (H) 11/10/2021   CREATININE 0.85 12/23/2016    --------------------------------------------------------------------------------------------------  Past Medical History:  Diagnosis Date   AKI (acute kidney injury) ([place])    03/2020-04/2020   Allergy    i.e contact allergies [facility]; also plants, trees    Chicken pox    Depression    Diabetes mellitus without complication ([place])    DIET CONTROLLED   Family history of breast cancer    BRCA negative in the past   Family history of breast cancer    Family history of lung cancer    Family history of rectal cancer    History of prediabetes    Hypertension    Interstitial cystitis    Interstitial cystitis    Spinal stenosis    UTI (urinary tract infection)    UTI (urinary tract infection)     Past Surgical History:  Procedure Laterality Date   CATARACT EXTRACTION Bilateral    b/l Dr. 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   Azelastine-Fluticasone 137-50 MCG/ACT SUSP 1 spray bid (Patient taking differently: 1  spray bid PRN)   bisacodyl 5 MG EC tablet Take 5 mg by mouth daily as needed for moderate constipation. TAKE 1-3 AS NEEDED   Cholecalciferol (VITAMIN D3 PO) Take 2,000 Units by mouth daily.   citalopram (CELEXA) 10 MG tablet Take 1 tablet (10 mg total) by mouth daily.   cyanocobalamin 1000 MCG tablet Take 1,000 mcg by mouth daily.   docusate sodium (COLACE) 100 MG capsule Take 1 capsule (100 mg total) by  mouth 2 (two) times daily as needed for mild constipation.   doxycycline (PERIOSTAT) 20 MG tablet TAKE 1 TABLET (20 MG TOTAL) BY MOUTH 2 (TWO) TIMES DAILY. WITH FOOD   lisinopril-hydrochlorothiazide (ZESTORETIC) 20-12.5 MG tablet Take 1 tablet by mouth daily. In am if BP too low <90<60 cut pill in 1/2   metoprolol tartrate (LOPRESSOR) 100 MG tablet Take 1 tablet (100 mg total) by mouth once for 1 dose. Take 2 hours prior to your CT scan.   Multiple Vitamin (MULTIVITAMIN) capsule Take 1 capsule by mouth daily.   traMADol (ULTRAM) 50 MG tablet Take 1 tablet (50 mg total) by mouth every 12 (twelve) hours as needed. Change to 60   traZODone (DESYREL) 100 MG tablet Take 1 tablet (100 mg total) by mouth at bedtime as needed for sleep.    Allergies: Amlodipine, Nsaids, Ozempic (0.25 or 0.5 mg-dose) [semaglutide(0.25 or 0.5mg -dos)], Benzalkonium chloride, Cetyl alcohol, Codeine, Lanolin, Neomycin, Nickel, and Propylene glycol  Social History   Tobacco Use   Smoking status: Former    Packs/day: 1.00    Years: 7.00    Total pack years: 7.00    Types: Cigarettes    Quit date: 1973    Years since quitting: 50.8   Smokeless tobacco: Never  Vaping Use   Vaping Use: Never used  Substance Use Topics   Alcohol use: No   Drug use: No    Family History  Problem Relation Age of Onset   Heart attack Mother 67   Breast cancer Mother 66       s/p b/l masectomy    COPD Mother    Heart failure Mother    Stroke Father        age 49    Breast cancer Sister 40   Breast cancer Maternal Aunt    Breast cancer Maternal Grandmother    Other Daughter        benign brain tumor   Polycystic ovary syndrome Daughter    Stroke Other        grandmother age 82    Lupus Other     Review of Systems: A 12-system review of systems was performed and was negative except as noted in the HPI.  --------------------------------------------------------------------------------------------------  Physical Exam: BP  120/70 (BP Location: Right Arm, Patient Position: Sitting, Cuff Size: Large)   Pulse 77   Ht 5\' 4"  (1.626 m)   Wt 174 lb (78.9 kg)   SpO2 98%   BMI 29.87 kg/m   General: NAD HEENT: No conjunctival pallor or scleral icterus. Neck: Supple without lymphadenopathy, thyromegaly, JVD, or HJR. No carotid bruit. Lungs: Normal work of breathing. Clear to auscultation bilaterally without wheezes or crackles. Heart: Regular rate and rhythm with 2/6 systolic murmur.  No rubs or gallops. Abd: Bowel sounds present. Soft, NT/ND without hepatosplenomegaly Ext: No lower extremity edema.  2+ radial pulses bilaterally.  Pedal pulses trace bilaterally. Skin: Warm and dry without rash. Neuro: CNIII-XII intact. Strength and fine-touch sensation intact in upper and lower extremities bilaterally. Psych:  Normal mood and affect.  EKG: Normal sinus rhythm with septal infarct and nonspecific ST changes.  Compared with prior tracing from 09/07/2020, possible septal infarct is new.  Nonspecific ST changes are slightly less pronounced.  Lab Results  Component Value Date   WBC 7.0 09/07/2021   HGB 11.9 (L) 09/07/2021   HCT 36.2 09/07/2021   MCV 85.2 09/07/2021   PLT 276.0 09/07/2021    Lab Results  Component Value Date   NA 139 11/10/2021   K 4.1 11/10/2021   CL 103 11/10/2021   CO2 27 11/10/2021   BUN 22 11/10/2021   CREATININE 1.05 (H) 11/10/2021   GLUCOSE 119 (H) 11/10/2021   ALT 14 09/07/2021    Lab Results  Component Value Date   CHOL 184 09/07/2021   HDL 58.20 09/07/2021   LDLCALC 103 (H) 09/07/2021   TRIG 113.0 09/07/2021   CHOLHDL 3 09/07/2021    --------------------------------------------------------------------------------------------------  ASSESSMENT AND PLAN: Dyspnea on exertion, fatigue, heart murmur, and abnormal EKG: The symptoms have been present for at least a year, having first become noticeable after right hip replacement.  Deconditioning could certainly be playing a role,  though underlying cardiovascular disease is a concern given multiple cardiac risk factors, systolic murmur appreciated on exam today, and abnormal EKG with septal Q waves as well as nonspecific ST segment changes.  I have recommended obtaining a transthoracic echocardiogram and coronary CTA.  We will defer medication changes at this time.  However, if there is evidence of ASCVD on the CTA, addition of a statin will need to be considered given LDL of 103 on last check in August.  Leg pain/paresthesia and decreased pedal pulses: Description of symptoms is most consistent with neuropathy.  However, pedal pulses are trace on exam today.  We have agreed to obtain ABIs at the time of Ms. Wingate's echocardiogram to exclude significant PAD.  Hypertension: Blood pressure is well controlled today.  Continue current dose of lisinopril-HCTZ with ongoing management per Dr. Terese Door.  Follow-up: Return to clinic after aforementioned testing (~4-6 weeks).  Nelva Bush, MD 11/11/2021 10:08 AM

## 2021-11-11 ENCOUNTER — Encounter: Payer: Self-pay | Admitting: Internal Medicine

## 2021-11-15 ENCOUNTER — Ambulatory Visit
Admission: RE | Admit: 2021-11-15 | Discharge: 2021-11-15 | Disposition: A | Discharge status: Home / Self Care | Payer: Medicare PPO | Source: Ambulatory Visit | Attending: Internal Medicine | Admitting: Internal Medicine

## 2021-11-15 DIAGNOSIS — Z1231 Encounter for screening mammogram for malignant neoplasm of breast: Secondary | ICD-10-CM | POA: Insufficient documentation

## 2021-11-15 DIAGNOSIS — Z780 Asymptomatic menopausal state: Secondary | ICD-10-CM | POA: Diagnosis not present

## 2021-11-15 DIAGNOSIS — M85852 Other specified disorders of bone density and structure, left thigh: Secondary | ICD-10-CM | POA: Diagnosis not present

## 2021-11-15 DIAGNOSIS — E2839 Other primary ovarian failure: Secondary | ICD-10-CM | POA: Insufficient documentation

## 2021-11-23 ENCOUNTER — Telehealth (HOSPITAL_COMMUNITY): Payer: Self-pay | Admitting: Emergency Medicine

## 2021-11-23 NOTE — Telephone Encounter (Signed)
Reaching out to patient to offer assistance regarding upcoming cardiac imaging study; pt verbalizes understanding of appt date/time, parking situation and where to check in, pre-test NPO status and medications ordered, and verified current allergies; name and call back number provided for further questions should they arise Marchia Bond RN Navigator Cardiac Imaging [facility] [phone] office [phone] cell  Arrival 1030 [facility] 100mg  metoprolol tartrate  Denies iv issues

## 2021-11-25 ENCOUNTER — Ambulatory Visit
Admission: RE | Admit: 2021-11-25 | Discharge: 2021-11-25 | Disposition: A | Discharge status: Home / Self Care | Payer: Medicare PPO | Source: Ambulatory Visit | Attending: Internal Medicine | Admitting: Internal Medicine

## 2021-11-25 DIAGNOSIS — I2089 Other forms of angina pectoris: Secondary | ICD-10-CM

## 2021-11-25 MED ORDER — IOHEXOL 350 MG/ML SOLN
75.0000 mL | Freq: Once | INTRAVENOUS | Status: AC | PRN
  Administered 2021-11-25: 75 mL via INTRAVENOUS

## 2021-11-25 MED ORDER — NITROGLYCERIN 0.4 MG SL SUBL
0.8000 mg | SUBLINGUAL_TABLET | Freq: Once | SUBLINGUAL | Status: AC
  Administered 2021-11-25: 0.8 mg via SUBLINGUAL

## 2021-11-25 NOTE — Progress Notes (Signed)
Patient tolerated procedure well. Ambulate w/o difficulty. Denies any lightheadedness or being dizzy. Pt denies any pain at this time. Sitting in chair, pt is encouraged to drink additional water throughout the day and reason explained to patient. Patient verbalized understanding and all questions answered. ABC intact. No further needs at this time. Discharge from procedure area w/o issues.  

## 2021-12-16 ENCOUNTER — Ambulatory Visit (INDEPENDENT_AMBULATORY_CARE_PROVIDER_SITE_OTHER): Payer: Medicare PPO

## 2021-12-16 ENCOUNTER — Ambulatory Visit: Payer: Medicare PPO | Attending: Internal Medicine

## 2021-12-16 DIAGNOSIS — R9431 Abnormal electrocardiogram [ECG] [EKG]: Secondary | ICD-10-CM

## 2021-12-16 DIAGNOSIS — R0609 Other forms of dyspnea: Secondary | ICD-10-CM | POA: Diagnosis not present

## 2021-12-16 DIAGNOSIS — M79605 Pain in left leg: Secondary | ICD-10-CM

## 2021-12-16 DIAGNOSIS — M79604 Pain in right leg: Secondary | ICD-10-CM

## 2021-12-16 LAB — ECHOCARDIOGRAM COMPLETE
AR max vel: 2.05 cm2
AV Area VTI: 2.01 cm2
AV Area mean vel: 1.94 cm2
AV Mean grad: 4.7 mmHg
AV Peak grad: 8.3 mmHg
Ao pk vel: 1.44 m/s
Area-P 1/2: 3.77 cm2
S' Lateral: 3.1 cm

## 2021-12-30 ENCOUNTER — Encounter: Payer: Self-pay | Admitting: Internal Medicine

## 2021-12-30 ENCOUNTER — Ambulatory Visit: Payer: Medicare PPO | Attending: Internal Medicine | Admitting: Internal Medicine

## 2021-12-30 VITALS — BP 120/70 | HR 75 | Ht 65.0 in | Wt 175.0 lb

## 2021-12-30 DIAGNOSIS — I5032 Chronic diastolic (congestive) heart failure: Secondary | ICD-10-CM

## 2021-12-30 DIAGNOSIS — I2510 Atherosclerotic heart disease of native coronary artery without angina pectoris: Secondary | ICD-10-CM

## 2021-12-30 DIAGNOSIS — I700 Atherosclerosis of aorta: Secondary | ICD-10-CM | POA: Diagnosis not present

## 2021-12-30 DIAGNOSIS — I10 Essential (primary) hypertension: Secondary | ICD-10-CM

## 2021-12-30 DIAGNOSIS — E785 Hyperlipidemia, unspecified: Secondary | ICD-10-CM

## 2021-12-30 MED ORDER — DAPAGLIFLOZIN PROPANEDIOL 10 MG PO TABS: 10.0000 mg | ORAL_TABLET | Freq: Every day | ORAL | 3 refills | Status: DC

## 2021-12-30 MED ORDER — ROSUVASTATIN CALCIUM 5 MG PO TABS: 5.0000 mg | ORAL_TABLET | Freq: Every day | ORAL | 3 refills | Status: DC

## 2021-12-30 NOTE — Progress Notes (Signed)
Follow-up Outpatient Visit Date: 12/30/2021  Primary Care Provider: Einar Pheasant, [address] [place] [id number]  Chief Complaint: Follow-up dyspnea on exertion  HPI:  Tabitha Lewis is a 71 y.o. female with history of hypertension, diet-controlled type 2 diabetes mellitus, and acute kidney injury, who presents for follow-up of dyspnea on exertion.  I met her in October for evaluation of fatigue and shortness of breath with activity that began over a year ago following a right hip replacement.  We agreed to obtain an echocardiogram and coronary CTA for further evaluation.  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  Echo revealed normal LVEF with grade 2 diastolic dysfunction.  No significant valvular abnormality was seen.  Today, Tabitha Lewis reports that her exertional dyspnea is about the same as at our last visit.  It does slow her down during some activities.  She is trying to remain active, being mindful about going upstairs when possible.  She has not had any chest pain or edema.  She has rare, self-limited palpitations.  She noticed a single episode of lightheadedness while bending over to decorate her Christmas tree.  She otherwise has not felt lightheaded or dizzy.  Home blood pressures have been well-controlled.  --------------------------------------------------------------------------------------------------  Cardiovascular History & Procedures: Cardiovascular Problems: Dyspnea on exertion   Risk Factors: Hypertension, type 2 diabetes mellitus, obesity, prior tobacco use, and age greater than 52   Cath/PCI: None   CV Surgery: None   EP Procedures and Devices: None   Non-Invasive Evaluation(s): TTE (12/16/2021): Normal LV size and wall thickness.  LVEF 55-60% with normal wall motion.  Grade 2 diastolic dysfunction.  Normal RV size and function.  Normal PA pressure.  Normal biatrial size.  No significant  valvular abnormality. Coronary CTA (11/25/2021): Normal coronary origins with 25% proximal LAD stenosis.  No significant disease in the LCx or RCA.  Coronary calcium score 129 (69th percentile).  No significant extracardiac abnormalities.  Recent CV Pertinent Labs: Lab Results  Component Value Date   CHOL 184 09/07/2021   HDL 58.20 09/07/2021   LDLCALC 103 (H) 09/07/2021   LDLCALC 92 12/23/2016   TRIG 113.0 09/07/2021   CHOLHDL 3 09/07/2021   K 4.1 11/10/2021   MG 1.7 03/25/2020   BUN 22 11/10/2021   CREATININE 1.05 (H) 11/10/2021   CREATININE 0.85 12/23/2016    Past medical and surgical history were reviewed and updated in EPIC.  Current Meds  Medication Sig   acetaminophen (TYLENOL) 500 MG tablet Take 1,000 mg by mouth every 6 (six) hours as needed.   amoxicillin (AMOXIL) 500 MG capsule Take 2,000 mg by mouth. Prior to dental procedures   Azelastine-Fluticasone 137-50 MCG/ACT SUSP 1 spray bid (Patient taking differently: 1 spray bid PRN)   bisacodyl 5 MG EC tablet Take 5 mg by mouth daily as needed for moderate constipation. TAKE 1-3 AS NEEDED   Cholecalciferol (VITAMIN D3 PO) Take 2,000 Units by mouth daily.   citalopram (CELEXA) 10 MG tablet Take 1 tablet (10 mg total) by mouth daily.   cyanocobalamin 1000 MCG tablet Take 1,000 mcg by mouth daily.   docusate sodium (COLACE) 100 MG capsule Take 1 capsule (100 mg total) by mouth 2 (two) times daily as needed for mild constipation.   doxycycline (PERIOSTAT) 20 MG tablet TAKE 1 TABLET (20 MG TOTAL) BY MOUTH 2 (TWO) TIMES DAILY. WITH FOOD   lisinopril-hydrochlorothiazide (ZESTORETIC) 20-12.5 MG tablet Take 1 tablet by mouth daily. In am if  BP too low <90<60 cut pill in 1/2   Multiple Vitamin (MULTIVITAMIN) capsule Take 1 capsule by mouth daily.   traMADol (ULTRAM) 50 MG tablet Take 1 tablet (50 mg total) by mouth every 12 (twelve) hours as needed. Change to 60   traZODone (DESYREL) 100 MG tablet Take 1 tablet (100 mg total) by mouth  at bedtime as needed for sleep.   [DISCONTINUED] metoprolol tartrate (LOPRESSOR) 100 MG tablet Take 1 tablet (100 mg total) by mouth once for 1 dose. Take 2 hours prior to your CT scan.    Allergies: Amlodipine, Nsaids, Ozempic (0.25 or 0.5 mg-dose) [semaglutide(0.25 or 0.78m-dos)], Benzalkonium chloride, Cetyl alcohol, Codeine, Lanolin, Neomycin, Nickel, and Propylene glycol  Social History   Tobacco Use   Smoking status: Former    Packs/day: 1.00    Years: 7.00    Total pack years: 7.00    Types: Cigarettes    Quit date: 1973    Years since quitting: 50.9   Smokeless tobacco: Never  Vaping Use   Vaping Use: Never used  Substance Use Topics   Alcohol use: No   Drug use: No    Family History  Problem Relation Age of Onset   Heart attack Mother 674  Breast cancer Mother 767      s/p b/l masectomy    COPD Mother    Heart failure Mother    Stroke Father        age 73   Breast cancer Sister 659  Breast cancer Maternal Aunt    Breast cancer Maternal Grandmother    Other Daughter        benign brain tumor   Polycystic ovary syndrome Daughter    Stroke Other        grandmother age 73   Lupus Other     Review of Systems: A 12-system review of systems was performed and was negative except as noted in the HPI.  --------------------------------------------------------------------------------------------------  Physical Exam: BP 120/70 (BP Location: Left Arm, Patient Position: Sitting, Cuff Size: Large)   Pulse 75   Ht _0  (1.651 m)   Wt 175 lb (79.4 kg)   SpO2 98%   BMI 29.12 kg/m   General:  NAD. Neck: No JVD or HJR. Lungs: Clear to auscultation bilaterally without wheezes or crackles. Heart: Regular rate and rhythm with 1/6 systolic murmur Abdomen: Soft, nontender, nondistended. Extremities: No lower extremity edema.  Lab Results  Component Value Date   WBC 7.0 09/07/2021   HGB 11.9 (L) 09/07/2021   HCT 36.2 09/07/2021   MCV 85.2 09/07/2021   PLT  276.0 09/07/2021    Lab Results  Component Value Date   NA 139 11/10/2021   K 4.1 11/10/2021   CL 103 11/10/2021   CO2 27 11/10/2021   BUN 22 11/10/2021   CREATININE 1.05 (H) 11/10/2021   GLUCOSE 119 (H) 11/10/2021   ALT 14 09/07/2021    Lab Results  Component Value Date   CHOL 184 09/07/2021   HDL 58.20 09/07/2021   LDLCALC 103 (H) 09/07/2021   TRIG 113.0 09/07/2021   CHOLHDL 3 09/07/2021    --------------------------------------------------------------------------------------------------  ASSESSMENT AND PLAN: Chronic HFpEF: Tabitha Lewis to have exertional dyspnea consistent with NYHA class II HFpEF.  Recent echocardiogram showed LVEF of 55-60% with grade 2 diastolic dysfunction.  As she appears euvolemic on exam today, I do not think we need to add a standing diuretic.  However, we have agreed to add dapagliflozin 10  mg daily to see if this offers some symptomatic improvement as well as better glycemic control.  Coronary artery disease, aortic atherosclerosis, and hyperlipidemia goal < 70: Recent coronary CTA was notable for some coronary artery calcification as well as aortic atherosclerosis.  Tabitha Lewis does not have any angina.  We discussed role for medical therapy to prevent progression of disease and have agreed to add rosuvastatin 5 mg daily to target an LDL less than 70.  She is due for follow-up with Dr. Nicki Reaper in February, at which time a lipid panel and LFTs should be checked.  Hypertension: Blood pressure well-controlled today in the office as well as at home.  Continue lisinopril-HCTZ as previously prescribed by Dr. Terese Door.  Follow-up: Return to clinic in 4 months.  Nelva Bush, MD 12/30/2021 11:52 AM

## 2021-12-30 NOTE — Patient Instructions (Addendum)
Medication Instructions:  Your physician recommends the following medication changes.  START TAKING: Rosuvastatin 5 mg by mouth daily Farxiga 10 mg by mouth daily  *If you need a refill on your cardiac medications before your next appointment, please call your pharmacy*   Lab Work: None ordered today   Testing/Procedures: None ordered today   Follow-Up: At [facility], you and your health needs are our priority.  As part of our continuing mission to provide you with exceptional heart care, we have created designated Provider Care Teams.  These Care Teams include your primary Cardiologist (physician) and Advanced Practice Providers (APPs -  Physician Assistants and Nurse Practitioners) who all work together to provide you with the care you need, when you need it.  We recommend signing up for the patient portal called "MyChart".  Sign up information is provided on this After Visit Summary.  MyChart is used to connect with patients for Virtual Visits (Telemedicine).  Patients are able to view lab/test results, encounter notes, upcoming appointments, etc.  Non-urgent messages can be sent to your provider as well.   To learn more about what you can do with MyChart, go to [email or web address].    Your next appointment:   4 month(s)  The format for your next appointment:   In Person  Provider:   You may see [name], MD or one of the following Advanced Practice Providers on your designated Care Team:   Nicolasa Ducking, NP Eula Listen, PA-C Cadence Fransico Michael, PA-C Charlsie Quest, NP

## 2021-12-31 ENCOUNTER — Encounter: Payer: Self-pay | Admitting: Internal Medicine

## 2021-12-31 DIAGNOSIS — I5032 Chronic diastolic (congestive) heart failure: Secondary | ICD-10-CM | POA: Insufficient documentation

## 2021-12-31 DIAGNOSIS — I2510 Atherosclerotic heart disease of native coronary artery without angina pectoris: Secondary | ICD-10-CM | POA: Insufficient documentation

## 2021-12-31 DIAGNOSIS — I700 Atherosclerosis of aorta: Secondary | ICD-10-CM | POA: Insufficient documentation

## 2021-12-31 DIAGNOSIS — E785 Hyperlipidemia, unspecified: Secondary | ICD-10-CM | POA: Insufficient documentation

## 2022-03-10 ENCOUNTER — Encounter: Payer: Self-pay | Admitting: Internal Medicine

## 2022-03-10 ENCOUNTER — Ambulatory Visit: Payer: Medicare PPO | Admitting: Internal Medicine

## 2022-03-10 ENCOUNTER — Ambulatory Visit: Payer: Medicare PPO | Admitting: Family Medicine

## 2022-03-10 VITALS — BP 120/68 | HR 69 | Temp 97.9°F | Ht 65.0 in | Wt 171.8 lb

## 2022-03-10 DIAGNOSIS — D649 Anemia, unspecified: Secondary | ICD-10-CM | POA: Diagnosis not present

## 2022-03-10 DIAGNOSIS — G629 Polyneuropathy, unspecified: Secondary | ICD-10-CM

## 2022-03-10 DIAGNOSIS — E785 Hyperlipidemia, unspecified: Secondary | ICD-10-CM

## 2022-03-10 DIAGNOSIS — F419 Anxiety disorder, unspecified: Secondary | ICD-10-CM

## 2022-03-10 DIAGNOSIS — Z803 Family history of malignant neoplasm of breast: Secondary | ICD-10-CM

## 2022-03-10 DIAGNOSIS — I700 Atherosclerosis of aorta: Secondary | ICD-10-CM

## 2022-03-10 DIAGNOSIS — F320 Major depressive disorder, single episode, mild: Secondary | ICD-10-CM

## 2022-03-10 DIAGNOSIS — I5032 Chronic diastolic (congestive) heart failure: Secondary | ICD-10-CM

## 2022-03-10 DIAGNOSIS — I10 Essential (primary) hypertension: Secondary | ICD-10-CM | POA: Diagnosis not present

## 2022-03-10 DIAGNOSIS — E1165 Type 2 diabetes mellitus with hyperglycemia: Secondary | ICD-10-CM | POA: Diagnosis not present

## 2022-03-10 DIAGNOSIS — M419 Scoliosis, unspecified: Secondary | ICD-10-CM

## 2022-03-10 DIAGNOSIS — I2510 Atherosclerotic heart disease of native coronary artery without angina pectoris: Secondary | ICD-10-CM

## 2022-03-10 NOTE — Progress Notes (Signed)
Subjective:    Patient ID: Tabitha Lewis, female    DOB: 1950/05/28, 72 y.o.   MRN: [id number]  Patient here for  Chief Complaint  Patient presents with   Medical Management of Chronic Issues    6 month follow up     HPI Here to establish care. History of diabetes, hypertension and hypercholesterolemia.  Former pt of Dr [facility].  Reports she is doing relatively well.  Has spinal stenosis and scoliosis.  Also with OA.  Takes tramadol prn.  Unable to take NSAIDS.  History of acute kidney injury - felt to be from NSAIDS.  Had some teeth extracted last week.  On abx.  Tries to stay active.  No increased sob reported. Saw Dr End 11/10/21 - CTA. 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  Echo revealed normal LVEF with grade 2 diastolic dysfunction. No significant valvular abnormality was seen.  Started on crestor and farxiga. ABIs - 12/16/21 - normal.  Does not smoke.  History of allergy issues.  Previously received allergy shots.  Off now.  Takes zyrtec.  Discussed family history.  Mother,sister, maternal grandmother and maternal aunt - breast cancer.  Had genetic testing.  Father - age 59 - CVA.    Past Medical History:  Diagnosis Date   AKI (acute kidney injury) ([place])    03/2020-04/2020   Allergy    i.e contact allergies [facility]; also plants, trees    Chicken pox    Depression    Diabetes mellitus without complication ([place])    DIET CONTROLLED   Family history of breast cancer    BRCA negative in the past   Family history of breast cancer    Family history of lung cancer    Family history of rectal cancer    History of prediabetes    Hypertension    Interstitial cystitis    Interstitial cystitis    Spinal stenosis    UTI (urinary tract infection)    UTI (urinary tract infection)    Past Surgical History:  Procedure Laterality Date   CATARACT EXTRACTION Bilateral    b/l Dr. 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    Procedure: TOTAL HIP ARTHROPLASTY ANTERIOR APPROACH;  Surgeon: Hessie Knows, MD;  Location: [facility] ORS;  Service: Orthopedics;  Laterality: Right;   Family History  Problem Relation Age of Onset   Heart attack Mother 20   Breast cancer Mother 40       s/p b/l masectomy    COPD Mother    Heart failure Mother    Stroke Father        age 87    Breast cancer Sister 73   Breast cancer Maternal Aunt    Breast cancer Maternal Grandmother    Other Daughter        benign brain tumor   Polycystic ovary syndrome Daughter    Stroke Other        grandmother age 42    Lupus Other    Social History   Socioeconomic History   Marital status: Widowed    Spouse name: Not on file   Number of children: Not on file   Years of education: Not on file   Highest education level: Not on file  Occupational History   Not on file  Tobacco Use   Smoking status: Former    Packs/day: 1.00    Years: 7.00    Total pack years: 7.00  Types: Cigarettes    Quit date: 65    Years since quitting: 51.1   Smokeless tobacco: Never  Vaping Use   Vaping Use: Never used  Substance and Sexual Activity   Alcohol use: No   Drug use: No   Sexual activity: Not on file  Other Topics Concern   Not on file  Social History Narrative   1 daughter and 1 son    3 grandsons    Married to high school sweet heart (husband) x 2 years dx'ed with prostate cancer in 04-09-15 he died 08-Jun-2017    Retired Event organiser education [place]   Social Determinants of Health   Financial Resource Strain: [place]  (02/22/2021)   Overall Financial Resource Strain (CARDIA)    Difficulty of Paying Living Expenses: Not hard at all  Food Insecurity: No [place] (02/22/2021)   Hunger Vital Sign    Worried About Running Out of Food in the Last Year: Never true    [place] in the Last Year: Never true  Transportation Needs: No Transportation Needs (02/22/2021)   PRAPARE - Hydrologist  (Medical): No    Lack of Transportation (Non-Medical): No  Physical Activity: Insufficiently Active (02/20/2020)   Exercise Vital Sign    Days of Exercise per Week: 1 day    Minutes of Exercise per Session: 60 min  Stress: No Stress Concern Present (02/22/2021)   Joy    Feeling of Stress : Only a little  Social Connections: Unknown (02/22/2021)   Social Connection and Isolation Panel [NHANES]    Frequency of Communication with Friends and Family: More than three times a week    Frequency of Social Gatherings with Friends and Family: More than three times a week    Attends Religious Services: Not on Advertising copywriter or Organizations: Yes    Attends Archivist Meetings: 1 to 4 times per year    Marital Status: Married     Review of Systems  Constitutional:  Negative for appetite change and unexpected weight change.  HENT:  Negative for congestion and sinus pressure.   Respiratory:  Negative for cough, chest tightness and shortness of breath.   Cardiovascular:  Negative for chest pain and palpitations.  Gastrointestinal:  Negative for abdominal pain, diarrhea, nausea and vomiting.  Genitourinary:  Negative for difficulty urinating and dysuria.  Musculoskeletal:  Negative for joint swelling and myalgias.  Skin:  Negative for color change and rash.  Neurological:  Negative for dizziness and headaches.  Psychiatric/Behavioral:  Negative for agitation and dysphoric mood.        Objective:     BP 120/68   Pulse 69   Temp 97.9 F (36.6 C) (Oral)   Ht '5\' 5"'$  (1.651 m)   Wt 171 lb 12.8 oz (77.9 kg)   SpO2 97%   BMI 28.59 kg/m  Wt Readings from Last 3 Encounters:  03/10/22 171 lb 12.8 oz (77.9 kg)  12/30/21 175 lb (79.4 kg)  11/10/21 174 lb (78.9 kg)    Physical Exam Vitals reviewed.  Constitutional:      General: She is not in acute distress.    Appearance: Normal appearance.  HENT:      Head: Normocephalic and atraumatic.     Right Ear: External ear normal.     Left Ear: External ear normal.  Eyes:  General: No scleral icterus.       Right eye: No discharge.        Left eye: No discharge.     Conjunctiva/sclera: Conjunctivae normal.  Neck:     Thyroid: No thyromegaly.  Cardiovascular:     Rate and Rhythm: Normal rate and regular rhythm.  Pulmonary:     Effort: No respiratory distress.     Breath sounds: Normal breath sounds. No wheezing.  Abdominal:     General: Bowel sounds are normal.     Palpations: Abdomen is soft.     Tenderness: There is no abdominal tenderness.  Musculoskeletal:        General: No swelling or tenderness.     Cervical back: Neck supple. No tenderness.  Lymphadenopathy:     Cervical: No cervical adenopathy.  Skin:    Findings: No erythema or rash.  Neurological:     Mental Status: She is alert.  Psychiatric:        Mood and Affect: Mood normal.        Behavior: Behavior normal.      Outpatient Encounter Medications as of 03/10/2022  Medication Sig   Cholecalciferol (VITAMIN D3 PO) Take 2,000 Units by mouth daily.   citalopram (CELEXA) 10 MG tablet Take 1 tablet (10 mg total) by mouth daily.   cyanocobalamin 1000 MCG tablet Take 1,000 mcg by mouth daily.   dapagliflozin propanediol (FARXIGA) 10 MG TABS tablet Take 1 tablet (10 mg total) by mouth daily before breakfast.   desoximetasone (TOPICORT) 0.25 % cream Apply 1 Application topically 2 (two) times daily.   docusate sodium (COLACE) 100 MG capsule Take 1 capsule (100 mg total) by mouth 2 (two) times daily as needed for mild constipation.   doxycycline (PERIOSTAT) 20 MG tablet TAKE 1 TABLET (20 MG TOTAL) BY MOUTH 2 (TWO) TIMES DAILY. WITH FOOD   lisinopril-hydrochlorothiazide (ZESTORETIC) 20-12.5 MG tablet Take 1 tablet by mouth daily. In am if BP too low <90<60 cut pill in 1/2   Multiple Vitamin (MULTIVITAMIN) capsule Take 1 capsule by mouth daily.   rosuvastatin (CRESTOR)  5 MG tablet Take 1 tablet (5 mg total) by mouth daily.   traMADol (ULTRAM) 50 MG tablet Take 1 tablet (50 mg total) by mouth every 12 (twelve) hours as needed. Change to 60   [DISCONTINUED] acetaminophen (TYLENOL) 500 MG tablet Take 1,000 mg by mouth every 6 (six) hours as needed. (Patient not taking: Reported on 03/10/2022)   [DISCONTINUED] amoxicillin (AMOXIL) 500 MG capsule Take 2,000 mg by mouth. Prior to dental procedures (Patient not taking: Reported on 03/10/2022)   [DISCONTINUED] Azelastine-Fluticasone 137-50 MCG/ACT SUSP 1 spray bid (Patient not taking: Reported on 03/10/2022)   [DISCONTINUED] bisacodyl 5 MG EC tablet Take 5 mg by mouth daily as needed for moderate constipation. TAKE 1-3 AS NEEDED (Patient not taking: Reported on 03/10/2022)   [DISCONTINUED] traZODone (DESYREL) 100 MG tablet Take 1 tablet (100 mg total) by mouth at bedtime as needed for sleep. (Patient not taking: Reported on 03/10/2022)   No facility-administered encounter medications on file as of 03/10/2022.     Lab Results  Component Value Date   WBC 6.9 03/10/2022   HGB 12.6 03/10/2022   HCT 38.1 03/10/2022   PLT 280.0 03/10/2022   GLUCOSE 91 03/10/2022   CHOL 144 03/10/2022   TRIG 98.0 03/10/2022   HDL 63.10 03/10/2022   LDLCALC 62 03/10/2022   ALT 20 03/10/2022   AST 20 03/10/2022   NA 135 03/10/2022  K 4.2 03/10/2022   CL 95 (L) 03/10/2022   CREATININE 1.24 (H) 03/10/2022   BUN 18 03/10/2022   CO2 28 03/10/2022   TSH 3.08 03/10/2022   HGBA1C 6.2 03/10/2022    CT CORONARY MORPH W/CTA COR W/SCORE W/CA W/CM &/OR WO/CM  Addendum Date: 11/25/2021   ADDENDUM REPORT: 11/25/2021 15:43 EXAM: OVER-READ INTERPRETATION  CT CHEST The following report is an over-read performed by radiologist Dr. Sabino [facility] on 11/25/2021. This over-read does not include interpretation of cardiac or coronary anatomy or pathology. The coronary CTA interpretation by the cardiologist is attached. COMPARISON:   None. FINDINGS: The visualized portions of the extracardiac vascular structures are unremarkable. Visualized mediastinum is unremarkable. Visualized portion of upper abdomen is unremarkable. Visualized pulmonary parenchyma is unremarkable. Visualized skeleton is unremarkable. IMPRESSION: No definite abnormality seen involving the visualized extracardiac structures of the chest. Electronically Signed   By: Marijo Conception M.D.   On: 11/25/2021 15:43   Result Date: 11/25/2021 CLINICAL DATA:  Chest pain, dyspnea on exertion EXAM: Cardiac/Coronary  CTA TECHNIQUE: The patient was scanned on a Siemens Somatom go.Top scanner. : A retrospective scan was triggered in the descending thoracic aorta. Axial non-contrast 3 mm slices were carried out through the heart. The data set was analyzed on a dedicated work station and scored using the [place]. Gantry rotation speed was 330 msecs and collimation was .6 mm. '100mg'$  of metoprolol and 0.8 mg of sl NTG was given. The 3D data set was reconstructed in 5% intervals of the 60-95 % of the R-R cycle. Diastolic phases were analyzed on a dedicated work station using MPR, MIP and VRT modes. The patient received 75 cc of contrast. FINDINGS: Aorta: Normal size. Mild aortic root and descending aorta calcifications. No dissection. Aortic Valve:  Trileaflet.  No calcifications. Coronary Arteries:  Normal coronary origin.  Right dominance. RCA is a dominant artery that gives rise to PDA and PLA. There is no plaque. Left main gives rise to LAD and LCX arteries.  LM has no disease. LAD has calcified plaque proximally causing mild stenosis (25%). LCX is a non-dominant artery that gives rise to two obtuse marginal branches. There is no plaque. Other findings: Normal pulmonary vein drainage into the left atrium. Normal left atrial appendage without a thrombus. Normal size of the pulmonary artery. IMPRESSION: 1. Coronary calcium score of 129. This was 69th percentile for age and sex matched  control. 2. Normal coronary origin with right dominance. 3. Mild proximal LAD stenosis (25%). 4. CAD-RADS 2. Mild non-obstructive CAD (25-49%). Consider non-atherosclerotic causes of chest pain. Consider preventive therapy and risk factor modification. Electronically Signed: By: Kate Sable M.D. On: 11/25/2021 15:26       Assessment & Plan:  Hyperlipidemia LDL goal <70 Assessment & Plan: Just started crestor.  Check lipid panel and liver function tests.    Orders: -     Hepatic function panel -     Lipid panel  Primary hypertension Assessment & Plan: Blood pressure as outlined.  Continue lisinopril/hctz.  Just started on farxiga.  Check metabolic panel.   Orders: -     CBC with Differential/Platelet -     Basic metabolic panel -     TSH  Anemia, unspecified type Assessment & Plan: Check cbc.   Orders: -     CBC with Differential/Platelet  Controlled type 2 diabetes mellitus with hyperglycemia, without long-term current use of insulin ([place]) Assessment & Plan: On farxiga now.  Just  started. Low carb diet and exercise.  Follow met b and A1c.    Orders: -     Hemoglobin A1c  Neuropathy -     Vitamin B12  Anxiety Assessment & Plan: Documented history of anxiety.  On celexa.  Appears to be doing well.  Continue.    Aortic atherosclerosis (HCC) Assessment & Plan: Crestor.    Chronic heart failure with preserved ejection fraction (HFpEF) (HCC) Assessment & Plan: NYHA class II HFpEF. Recent echocardiogram showed LVEF of 55-60% with grade 2 diastolic dysfunction. Followed by Dr End.  Added dapagliflozin 10 mg (12/2021)   Coronary artery disease involving native coronary artery of native heart without angina pectoris Assessment & Plan: Just evaluated by Dr End.  CTA as outlined. 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     Depression, major, single episode, mild ([place]) Assessment & Plan: Documented in chart.  On celexa.  Appears to be doing well.  Follow.    Essential  hypertension Assessment & Plan: Blood pressure as outlined.  Continue lisinopril/hctz.  Just started farxiga.  Follow pressures.  Check metabolic panel.    Family history of breast cancer Assessment & Plan: Has had genetic testing.  Review.  Question of need for further f/up.    Scoliosis of lumbar spine, unspecified scoliosis type Assessment & Plan: Has history of spinal stenosis and scoliosis.  Takes tramadol prn.  Follow.       Einar Pheasant, MD

## 2022-03-10 NOTE — Patient Instructions (Signed)
Annual Wellness visit is due, please schedule this appointment at checkout.

## 2022-03-11 LAB — CBC WITH DIFFERENTIAL/PLATELET
Basophils Absolute: 0.1 10*3/uL (ref 0.0–0.1)
Basophils Relative: 0.9 % (ref 0.0–3.0)
Eosinophils Absolute: 0.1 10*3/uL (ref 0.0–0.7)
Eosinophils Relative: 1 % (ref 0.0–5.0)
HCT: 38.1 % (ref 36.0–46.0)
Hemoglobin: 12.6 g/dL (ref 12.0–15.0)
Lymphocytes Relative: 21.6 % (ref 12.0–46.0)
Lymphs Abs: 1.5 10*3/uL (ref 0.7–4.0)
MCHC: 33.2 g/dL (ref 30.0–36.0)
MCV: 85.3 fl (ref 78.0–100.0)
Monocytes Absolute: 0.6 10*3/uL (ref 0.1–1.0)
Monocytes Relative: 9.1 % (ref 3.0–12.0)
Neutro Abs: 4.6 10*3/uL (ref 1.4–7.7)
Neutrophils Relative %: 67.4 % (ref 43.0–77.0)
Platelets: 280 10*3/uL (ref 150.0–400.0)
RBC: 4.46 Mil/uL (ref 3.87–5.11)
RDW: 12.9 % (ref 11.5–15.5)
WBC: 6.9 10*3/uL (ref 4.0–10.5)

## 2022-03-11 LAB — BASIC METABOLIC PANEL
BUN: 18 mg/dL (ref 6–23)
CO2: 28 mEq/L (ref 19–32)
Calcium: 10 mg/dL (ref 8.4–10.5)
Chloride: 95 mEq/L — ABNORMAL LOW (ref 96–112)
Creatinine, Ser: 1.24 mg/dL — ABNORMAL HIGH (ref 0.40–1.20)
GFR: 43.13 mL/min — ABNORMAL LOW (ref >60.00)
Glucose, Bld: 91 mg/dL (ref 70–99)
Potassium: 4.2 mEq/L (ref 3.5–5.1)
Sodium: 135 mEq/L (ref 135–145)

## 2022-03-11 LAB — VITAMIN B12: Vitamin B-12: 300 pg/mL (ref 211–911)

## 2022-03-11 LAB — LIPID PANEL
Cholesterol: 144 mg/dL (ref 0–200)
HDL: 63.1 mg/dL (ref >39.00)
LDL Cholesterol: 62 mg/dL (ref 0–99)
NonHDL: 81.13
Total CHOL/HDL Ratio: 2
Triglycerides: 98 mg/dL (ref 0.0–149.0)
VLDL: 19.6 mg/dL (ref 0.0–40.0)

## 2022-03-11 LAB — HEPATIC FUNCTION PANEL
ALT: 20 U/L (ref 0–35)
AST: 20 U/L (ref 0–37)
Albumin: 4.6 g/dL (ref 3.5–5.2)
Alkaline Phosphatase: 64 U/L (ref 39–117)
Bilirubin, Direct: 0.1 mg/dL (ref 0.0–0.3)
Total Bilirubin: 0.6 mg/dL (ref 0.2–1.2)
Total Protein: 7.1 g/dL (ref 6.0–8.3)

## 2022-03-11 LAB — HEMOGLOBIN A1C: Hgb A1c MFr Bld: 6.2 % (ref 4.6–6.5)

## 2022-03-11 LAB — TSH: TSH: 3.08 u[IU]/mL (ref 0.35–5.50)

## 2022-03-14 ENCOUNTER — Other Ambulatory Visit: Payer: Self-pay

## 2022-03-14 DIAGNOSIS — R944 Abnormal results of kidney function studies: Secondary | ICD-10-CM

## 2022-03-14 NOTE — Assessment & Plan Note (Signed)
On farxiga now.  Just started. Low carb diet and exercise.  Follow met b and A1c.

## 2022-03-14 NOTE — Assessment & Plan Note (Signed)
Documented history of anxiety.  On celexa.  Appears to be doing well.  Continue.

## 2022-03-14 NOTE — Assessment & Plan Note (Signed)
Documented in chart.  On celexa.  Appears to be doing well.  Follow.

## 2022-03-14 NOTE — Assessment & Plan Note (Signed)
Blood pressure as outlined.  Continue lisinopril/hctz.  Just started farxiga.  Follow pressures.  Check metabolic panel.

## 2022-03-14 NOTE — Assessment & Plan Note (Signed)
Has had genetic testing.  Review.  Question of need for further f/up.

## 2022-03-14 NOTE — Assessment & Plan Note (Signed)
-   Crestor 

## 2022-03-14 NOTE — Assessment & Plan Note (Signed)
Blood pressure as outlined.  Continue lisinopril/hctz.  Just started on farxiga.  Check metabolic panel.

## 2022-03-14 NOTE — Assessment & Plan Note (Signed)
Has history of spinal stenosis and scoliosis.  Takes tramadol prn.  Follow.

## 2022-03-14 NOTE — Assessment & Plan Note (Signed)
Just evaluated by Dr End.  CTA as outlined. 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

## 2022-03-14 NOTE — Assessment & Plan Note (Signed)
Just started crestor.  Check lipid panel and liver function tests.

## 2022-03-14 NOTE — Assessment & Plan Note (Signed)
Check cbc 

## 2022-03-14 NOTE — Assessment & Plan Note (Signed)
NYHA class II HFpEF. Recent echocardiogram showed LVEF of 55-60% with grade 2 diastolic dysfunction. Followed by Dr End.  Added dapagliflozin 10 mg (12/2021)

## 2022-03-16 NOTE — Telephone Encounter (Signed)
Called Tabitha Lewis.  Let her know that genetic counselor is reviewing and will let me know if any further f/u is warranted.

## 2022-03-21 ENCOUNTER — Other Ambulatory Visit (INDEPENDENT_AMBULATORY_CARE_PROVIDER_SITE_OTHER): Payer: Medicare PPO

## 2022-03-21 ENCOUNTER — Ambulatory Visit: Payer: Medicare PPO

## 2022-03-21 VITALS — BP 117/77

## 2022-03-21 DIAGNOSIS — I10 Essential (primary) hypertension: Secondary | ICD-10-CM

## 2022-03-21 DIAGNOSIS — R944 Abnormal results of kidney function studies: Secondary | ICD-10-CM

## 2022-03-21 LAB — BASIC METABOLIC PANEL
BUN: 21 mg/dL (ref 6–23)
CO2: 29 mEq/L (ref 19–32)
Calcium: 9.8 mg/dL (ref 8.4–10.5)
Chloride: 102 mEq/L (ref 96–112)
Creatinine, Ser: 1.09 mg/dL (ref 0.40–1.20)
GFR: 50.33 mL/min — ABNORMAL LOW (ref >60.00)
Glucose, Bld: 104 mg/dL — ABNORMAL HIGH (ref 70–99)
Potassium: 3.9 mEq/L (ref 3.5–5.1)
Sodium: 139 mEq/L (ref 135–145)

## 2022-03-21 NOTE — Progress Notes (Signed)
Pt presented to have  her BP cuff calibrated. Pt was identified through two identifiers. After pt sat down in exam room I gave her 4 minutes to rest before I had her check her BP with her home cuff; I watched her place the cuff correctly on her arm.   Her BP machine read 151/79. Pt handed me her BP log to give to her PCP Scott. I then gave the log to Dr. Nicki Reaper; while I had her wait 5-8 minutes before checking her BP with our cuff.   Her BP reading with our cuff was 114/79 which is around her normal reading she has on her log. I then let pt wait another 4-5 minutes before having her check with her cuff again to see the difference.   Pt denies any symptoms and states she feels fine but she wrote on her log when she felt dizzy while doing yard work. Her BP machine read: 117/77 pt was advised that her machine was really close to ours and that it was only a 3 point difference on the top and 2 point difference on the bottom but not big difference.

## 2022-03-22 ENCOUNTER — Telehealth: Payer: Self-pay | Admitting: Internal Medicine

## 2022-03-22 NOTE — Telephone Encounter (Signed)
Came in yesterday for nurse visit to have home blood pressure cuff checked.  Reviewed note and home blood pressures.  Per note, report - dizziness on one occasion.  Blood pressure here ok.  Cuff appears to correlate with ours.  Have her continue to spot check pressure and send in readings over the next 2-3 weeks.  If persistent decrease or if dizziness, will need to adjust medication.

## 2022-03-22 NOTE — Telephone Encounter (Signed)
Patient is aware of below and will continue to check pressure.

## 2022-04-20 ENCOUNTER — Telehealth: Payer: Self-pay | Admitting: Internal Medicine

## 2022-04-20 NOTE — Telephone Encounter (Signed)
Called patient to schedule Medicare Annual Wellness Visit (AWV). Left message for patient to call back and schedule Medicare Annual Wellness Visit (AWV).  Last date of AWV: 02/22/2021   Please schedule an AWVS appointment at any time with [place].  If any questions, please contact me at [phone].    Thank you,  Jordan Direct dial  [phone]

## 2022-04-27 ENCOUNTER — Encounter: Payer: Self-pay | Admitting: Internal Medicine

## 2022-04-27 DIAGNOSIS — M47816 Spondylosis without myelopathy or radiculopathy, lumbar region: Secondary | ICD-10-CM

## 2022-04-27 DIAGNOSIS — M1712 Unilateral primary osteoarthritis, left knee: Secondary | ICD-10-CM

## 2022-04-27 DIAGNOSIS — R937 Abnormal findings on diagnostic imaging of other parts of musculoskeletal system: Secondary | ICD-10-CM

## 2022-04-27 DIAGNOSIS — M1612 Unilateral primary osteoarthritis, left hip: Secondary | ICD-10-CM

## 2022-04-27 DIAGNOSIS — M542 Cervicalgia: Secondary | ICD-10-CM

## 2022-04-27 DIAGNOSIS — M1611 Unilateral primary osteoarthritis, right hip: Secondary | ICD-10-CM

## 2022-04-27 DIAGNOSIS — G8929 Other chronic pain: Secondary | ICD-10-CM

## 2022-04-28 NOTE — Telephone Encounter (Signed)
Reviewed chart.  Need to clarify a few things.  In reviewing her refill history - she has refilled the last two prescriptions -by Dr Marijean Bravo.  Need to clarify where planning to get refilled and clarify how often she is taking.  Thanks.

## 2022-04-29 MED ORDER — TRAMADOL HCL 50 MG PO TABS: 50.0000 mg | ORAL_TABLET | Freq: Two times a day (BID) | ORAL | 1 refills | Status: DC | PRN

## 2022-04-29 NOTE — Telephone Encounter (Signed)
Patient says that Tabitha Lewis was her oral surgeon. She does not see him regularly. She confirmed does not use tramadol regularly. Just uses PRN. Maybe 1-2 a week if that for arthritis flares.

## 2022-04-29 NOTE — Telephone Encounter (Signed)
Rx sent in for tramadol

## 2022-05-04 ENCOUNTER — Ambulatory Visit: Payer: Medicare PPO | Admitting: Internal Medicine

## 2022-05-18 ENCOUNTER — Encounter: Payer: Self-pay | Admitting: Internal Medicine

## 2022-05-18 ENCOUNTER — Ambulatory Visit: Payer: Medicare PPO | Attending: Internal Medicine | Admitting: Internal Medicine

## 2022-05-18 VITALS — BP 110/64 | HR 62 | Ht 65.0 in | Wt 173.6 lb

## 2022-05-18 DIAGNOSIS — I2510 Atherosclerotic heart disease of native coronary artery without angina pectoris: Secondary | ICD-10-CM

## 2022-05-18 DIAGNOSIS — I700 Atherosclerosis of aorta: Secondary | ICD-10-CM

## 2022-05-18 DIAGNOSIS — E785 Hyperlipidemia, unspecified: Secondary | ICD-10-CM | POA: Diagnosis not present

## 2022-05-18 DIAGNOSIS — I10 Essential (primary) hypertension: Secondary | ICD-10-CM | POA: Diagnosis not present

## 2022-05-18 DIAGNOSIS — I5032 Chronic diastolic (congestive) heart failure: Secondary | ICD-10-CM | POA: Diagnosis not present

## 2022-05-18 DIAGNOSIS — I493 Ventricular premature depolarization: Secondary | ICD-10-CM | POA: Diagnosis not present

## 2022-05-18 NOTE — Patient Instructions (Signed)
Medication Instructions:  You may start taking Magnesium Citrate 250 mg daily over the counter for palpitations.   If you continue to experience dizziness you may decrease your lisinopril- hydrochlorothiazide to 1/2 tablet daily.  *If you need a refill on your cardiac medications before your next appointment, please call your pharmacy*   Lab Work: No labs ordered.   Testing/Procedures: No procedures ordered.    Follow-Up: At [facility], you and your health needs are our priority.  As part of our continuing mission to provide you with exceptional heart care, we have created designated Provider Care Teams.  These Care Teams include your primary Cardiologist (physician) and Advanced Practice Providers (APPs -  Physician Assistants and Nurse Practitioners) who all work together to provide you with the care you need, when you need it.  We recommend signing up for the patient portal called "MyChart".  Sign up information is provided on this After Visit Summary.  MyChart is used to connect with patients for Virtual Visits (Telemedicine).  Patients are able to view lab/test results, encounter notes, upcoming appointments, etc.  Non-urgent messages can be sent to your provider as well.   To learn more about what you can do with MyChart, go to [email or web address].    Your next appointment:   1 year(s)  Provider:   You may see [name], MD or one of the following Advanced Practice Providers on your designated Care Team:   Nicolasa Ducking, NP Eula Listen, PA-C Cadence Fransico Michael, PA-C Charlsie Quest, NP

## 2022-05-18 NOTE — Progress Notes (Unsigned)
Follow-up Outpatient Visit Date: 05/18/2022  Primary Care Provider: Dale , MD [address] [place] [address]  Chief Complaint: Follow-up HFpEF  HPI:  Tabitha Lewis is a 72 y.o. female with history of chronic HFpEF, hypertension, diet-controlled type 2 diabetes mellitus, and acute kidney injury, who presents for follow-up of dyspnea on exertion.  I last saw her in December, at which time she continued to complain of exertional dyspnea with moderate activity.  Preceding echo showed EF of 55-60% with grade 2 diastolic dysfunction.  We agreed to add dapagliflozin to see if this would help her symptoms.  Given nonobstructive coronary artery disease and aortic atherosclerosis, rosuvastatin 5 mg daily was also added.  LDL was at goal (62) on follow-up labs in February.  Today, Tabitha Lewis reports that she has been feeling fairly well, denying chest pain.  She has mild exertional dyspnea when working outside, which is unchanged from prior visits.  She occasionally has transient orthostatic lightheadedness but has not passed out or fallen.  She has rare, self-limited "skipped beats" but no sustained palpitations.  Home blood pressures are usually similar to today's reading.  She tries to stay well-hydrated with water, particularly when she works outside.  --------------------------------------------------------------------------------------------------  Cardiovascular History & Procedures: Cardiovascular Problems: Dyspnea on exertion   Risk Factors: Hypertension, type 2 diabetes mellitus, obesity, prior tobacco use, and age greater than 25   Cath/PCI: None   CV Surgery: None   EP Procedures and Devices: None   Non-Invasive Evaluation(s): TTE (12/16/2021): Normal LV size and wall thickness.  LVEF 55-60% with normal wall motion.  Grade 2 diastolic dysfunction.  Normal RV size and function.  Normal PA pressure.  Normal biatrial size.  No significant  valvular abnormality. Coronary CTA (11/25/2021): Normal coronary origins with 25% proximal LAD stenosis.  No significant disease in the LCx or RCA.  Coronary calcium score 129 (69th percentile).  No significant extracardiac abnormalities.  Recent CV Pertinent Labs: Lab Results  Component Value Date   CHOL 144 03/10/2022   HDL 63.10 03/10/2022   LDLCALC 62 03/10/2022   LDLCALC 92 12/23/2016   TRIG 98.0 03/10/2022   CHOLHDL 2 03/10/2022   K 3.9 03/21/2022   MG 1.7 03/25/2020   BUN 21 03/21/2022   CREATININE 1.09 03/21/2022   CREATININE 0.85 12/23/2016    Past medical and surgical history were reviewed and updated in EPIC.  Current Meds  Medication Sig   Cholecalciferol (VITAMIN D3 PO) Take 2,000 Units by mouth daily.   citalopram (CELEXA) 10 MG tablet Take 1 tablet (10 mg total) by mouth daily.   cyanocobalamin 1000 MCG tablet Take 1,000 mcg by mouth daily.   dapagliflozin propanediol (FARXIGA) 10 MG TABS tablet Take 1 tablet (10 mg total) by mouth daily before breakfast.   desoximetasone (TOPICORT) 0.25 % cream Apply 1 Application topically 2 (two) times daily.   docusate sodium (COLACE) 100 MG capsule Take 1 capsule (100 mg total) by mouth 2 (two) times daily as needed for mild constipation.   doxycycline (VIBRAMYCIN) 100 MG capsule Take 100 mg by mouth daily. For 2 months/temporary dose   lisinopril-hydrochlorothiazide (ZESTORETIC) 20-12.5 MG tablet Take 1 tablet by mouth daily. In am if BP too low <90<60 cut pill in 1/2   Multiple Vitamin (MULTIVITAMIN) capsule Take 1 capsule by mouth daily.   rosuvastatin (CRESTOR) 5 MG tablet Take 1 tablet (5 mg total) by mouth daily.   traMADol (ULTRAM) 50 MG tablet Take 1 tablet (50 mg total)  by mouth every 12 (twelve) hours as needed.   traZODone (DESYREL) 100 MG tablet Take 100 mg by mouth at bedtime as needed.    Allergies: Amlodipine, Nsaids, Ozempic (0.25 or 0.5 mg-dose) [semaglutide(0.25 or 0.5mg -dos)], Benzalkonium chloride, Cetyl  alcohol, Codeine, Lanolin, Neomycin, Nickel, and Propylene glycol  Social History   Tobacco Use   Smoking status: Former    Packs/day: 1.00    Years: 7.00    Additional pack years: 0.00    Total pack years: 7.00    Types: Cigarettes    Quit date: 85    Years since quitting: 51.3   Smokeless tobacco: Never  Vaping Use   Vaping Use: Never used  Substance Use Topics   Alcohol use: No   Drug use: No    Family History  Problem Relation Age of Onset   Heart attack Mother 84   Breast cancer Mother 29       s/p b/l masectomy    COPD Mother    Heart failure Mother    Stroke Father        age 40    Breast cancer Sister 57   Breast cancer Maternal Aunt    Breast cancer Maternal Grandmother    Other Daughter        benign brain tumor   Polycystic ovary syndrome Daughter    Stroke Other        grandmother age 15    Lupus Other     Review of Systems: A 12-system review of systems was performed and was negative except as noted in the HPI.  --------------------------------------------------------------------------------------------------  Physical Exam: BP 110/64 (BP Location: Left Arm, Patient Position: Sitting, Cuff Size: Large)   Pulse 62   Ht 5\' 5"  (1.651 m)   Wt 173 lb 9.6 oz (78.7 kg)   SpO2 96%   BMI 28.89 kg/m   General:  NAD. Neck: No JVD or HJR. Lungs: Clear to auscultation bilaterally without wheezes or crackles. Heart: Regular rate and rhythm occasional extrasystoles.  No murmurs. Abdomen: Soft, nontender, nondistended. Extremities: No lower extremity edema.  EKG:  Normal sinus rhythm with occasional PVC's and possible septal infarct.  PVC's are new since 11/10/2021.  Otherwise, no significant interval change.  Lab Results  Component Value Date   WBC 6.9 03/10/2022   HGB 12.6 03/10/2022   HCT 38.1 03/10/2022   MCV 85.3 03/10/2022   PLT 280.0 03/10/2022    Lab Results  Component Value Date   NA 139 03/21/2022   K 3.9 03/21/2022   CL 102  03/21/2022   CO2 29 03/21/2022   BUN 21 03/21/2022   CREATININE 1.09 03/21/2022   GLUCOSE 104 (H) 03/21/2022   ALT 20 03/10/2022    Lab Results  Component Value Date   CHOL 144 03/10/2022   HDL 63.10 03/10/2022   LDLCALC 62 03/10/2022   TRIG 98.0 03/10/2022   CHOLHDL 2 03/10/2022    --------------------------------------------------------------------------------------------------  ASSESSMENT AND PLAN: Chronic HFpEF: Tabitha Lewis reports stable NYHA class II symptoms.  She appears euvolemic on exam today.  Blood pressure is well-controlled.  Continue current regimen of dapagliflozin and lisinopril-HCTZ.  PVCs: Incidentally noted today with report of ongoing brief, self-limited palpitations at home.  Defer additional testing at this time.  Given low normal magnesium in the past, I have encouraged Tabitha Lewis to try taking magnesium citrate 250 mg daily at home.  Hypertension: Blood pressure well-controlled today.  Continue current regimen of lisinopril-HCTZ.  Hyperlipidemia, coronary artery calcification, and aortic  atherosclerosis: Tabitha Lewis is tolerating low-dose rosuvastatin well with excellent response.  LDL at goal.  Continue rosuvastatin 5 mg daily.  Follow-up: Return to clinic in 1 year.  [name], MD 05/18/2022 10:01 AM

## 2022-05-19 ENCOUNTER — Encounter: Payer: Self-pay | Admitting: Internal Medicine

## 2022-05-19 DIAGNOSIS — I493 Ventricular premature depolarization: Secondary | ICD-10-CM | POA: Insufficient documentation

## 2022-05-25 DIAGNOSIS — H1013 Acute atopic conjunctivitis, bilateral: Secondary | ICD-10-CM | POA: Diagnosis not present

## 2022-05-25 DIAGNOSIS — Z961 Presence of intraocular lens: Secondary | ICD-10-CM | POA: Diagnosis not present

## 2022-05-25 DIAGNOSIS — H43813 Vitreous degeneration, bilateral: Secondary | ICD-10-CM | POA: Diagnosis not present

## 2022-05-25 LAB — HM DIABETES EYE EXAM

## 2022-06-16 ENCOUNTER — Emergency Department: Payer: Medicare PPO

## 2022-06-16 ENCOUNTER — Other Ambulatory Visit: Payer: Self-pay

## 2022-06-16 ENCOUNTER — Telehealth: Payer: Self-pay | Admitting: Internal Medicine

## 2022-06-16 ENCOUNTER — Emergency Department
Admission: EM | Admit: 2022-06-16 | Discharge: 2022-06-16 | Disposition: A | Discharge status: Home / Self Care | Payer: Medicare PPO | Attending: Student in an Organized Health Care Education/Training Program | Admitting: Student in an Organized Health Care Education/Training Program

## 2022-06-16 DIAGNOSIS — K449 Diaphragmatic hernia without obstruction or gangrene: Secondary | ICD-10-CM | POA: Diagnosis not present

## 2022-06-16 DIAGNOSIS — E119 Type 2 diabetes mellitus without complications: Secondary | ICD-10-CM | POA: Diagnosis not present

## 2022-06-16 DIAGNOSIS — I10 Essential (primary) hypertension: Secondary | ICD-10-CM | POA: Insufficient documentation

## 2022-06-16 DIAGNOSIS — R072 Precordial pain: Secondary | ICD-10-CM | POA: Diagnosis not present

## 2022-06-16 DIAGNOSIS — R0789 Other chest pain: Secondary | ICD-10-CM

## 2022-06-16 DIAGNOSIS — K219 Gastro-esophageal reflux disease without esophagitis: Secondary | ICD-10-CM | POA: Diagnosis not present

## 2022-06-16 DIAGNOSIS — K209 Esophagitis, unspecified without bleeding: Secondary | ICD-10-CM | POA: Diagnosis not present

## 2022-06-16 DIAGNOSIS — R079 Chest pain, unspecified: Secondary | ICD-10-CM | POA: Diagnosis not present

## 2022-06-16 LAB — CBC
HCT: 41 % (ref 36.0–46.0)
Hemoglobin: 13.3 g/dL (ref 12.0–15.0)
MCH: 27.8 pg (ref 26.0–34.0)
MCHC: 32.4 g/dL (ref 30.0–36.0)
MCV: 85.8 fL (ref 80.0–100.0)
Platelets: 232 10*3/uL (ref 150–400)
RBC: 4.78 MIL/uL (ref 3.87–5.11)
RDW: 11.9 % (ref 11.5–15.5)
WBC: 5.5 10*3/uL (ref 4.0–10.5)
nRBC: 0 % (ref 0.0–0.2)

## 2022-06-16 LAB — HEPATIC FUNCTION PANEL
ALT: 17 U/L (ref 0–44)
AST: 23 U/L (ref 15–41)
Albumin: 4.1 g/dL (ref 3.5–5.0)
Alkaline Phosphatase: 61 U/L (ref 38–126)
Bilirubin, Direct: 0.1 mg/dL (ref 0.0–0.2)
Indirect Bilirubin: 0.8 mg/dL (ref 0.3–0.9)
Total Bilirubin: 0.9 mg/dL (ref 0.3–1.2)
Total Protein: 7.2 g/dL (ref 6.5–8.1)

## 2022-06-16 LAB — BASIC METABOLIC PANEL
Anion gap: 10 (ref 5–15)
BUN: 17 mg/dL (ref 8–23)
CO2: 25 mmol/L (ref 22–32)
Calcium: 9.3 mg/dL (ref 8.9–10.3)
Chloride: 103 mmol/L (ref 98–111)
Creatinine, Ser: 1.1 mg/dL — ABNORMAL HIGH (ref 0.44–1.00)
GFR, Estimated: 53 mL/min — ABNORMAL LOW (ref >60)
Glucose, Bld: 110 mg/dL — ABNORMAL HIGH (ref 70–99)
Potassium: 3.4 mmol/L — ABNORMAL LOW (ref 3.5–5.1)
Sodium: 138 mmol/L (ref 135–145)

## 2022-06-16 LAB — TROPONIN I (HIGH SENSITIVITY)
Troponin I (High Sensitivity): 5 ng/L (ref <18)
Troponin I (High Sensitivity): 4 ng/L (ref <18)

## 2022-06-16 LAB — LIPASE, BLOOD: Lipase: 43 U/L (ref 11–51)

## 2022-06-16 MED ORDER — SUCRALFATE 1 G PO TABS: 1.0000 g | ORAL_TABLET | Freq: Four times a day (QID) | ORAL | 1 refills | Status: DC

## 2022-06-16 MED ORDER — FAMOTIDINE 20 MG PO TABS: 20.0000 mg | ORAL_TABLET | Freq: Every day | ORAL | 1 refills | Status: DC

## 2022-06-16 MED ORDER — IOHEXOL 300 MG/ML  SOLN
150.0000 mL | Freq: Once | INTRAMUSCULAR | Status: AC | PRN
  Administered 2022-06-16: 50 mL via ORAL

## 2022-06-16 NOTE — Telephone Encounter (Signed)
FYI

## 2022-06-16 NOTE — ED Notes (Signed)
Patient transported to X-ray 

## 2022-06-16 NOTE — ED Triage Notes (Signed)
Pt to ED for possible indigestion started Tuesday night. Denies hx of same, states has tried OTC meds with little improvement. +nausea.  Describes feeling as ache in chest currently.

## 2022-06-16 NOTE — Telephone Encounter (Signed)
Pt called stating she is having chest pain below her breast bone since Tuesday sent to access nurse

## 2022-06-16 NOTE — Telephone Encounter (Signed)
Called patient to follow up with her. She is going to go to the ED for evaluation. She is having someone drive her there. Advised that we would follow up after

## 2022-06-16 NOTE — ED Provider Notes (Signed)
[facility] Provider Note    Event Date/Time   First MD Initiated Contact with Patient 06/16/22 1238     (approximate)   History   Chest Pain   HPI  Tabitha Lewis is a 72 y.o. female with a history of diabetes, hypertension, anxiety, depression presents to the ER for evaluation of midsternal nonradiating chest pain discomfort feels like having some symptoms like heartburn like she is having trouble getting comfortable.  Denies any shortness of breath.  The symptoms started on Tuesday in the middle the night.  Did resolve on the right she still has a little bit of discomfort and feels like when she eats it is as if food gets caught up.  She denies any pain.  Feels she is able to eat and drink no vomiting.  No lower abdominal pain.     Physical Exam   Triage Vital Signs: ED Triage Vitals  Enc Vitals Group     BP 06/16/22 1111 118/84     Pulse Rate 06/16/22 1111 70     Resp 06/16/22 1111 16     Temp 06/16/22 1111 98.4 F (36.9 C)     Temp Source 06/16/22 1111 Oral     SpO2 06/16/22 1111 96 %     Weight 06/16/22 1109 172 lb (78 kg)     Height 06/16/22 1109 5\' 4"  (1.626 m)     Head Circumference --      Peak Flow --      Pain Score 06/16/22 1109 3     Pain Loc --      Pain Edu? --      Excl. in GC? --     Most recent vital signs: Vitals:   06/16/22 1111  BP: 118/84  Pulse: 70  Resp: 16  Temp: 98.4 F (36.9 C)  SpO2: 96%     Constitutional: Alert  Eyes: Conjunctivae are normal.  Head: Atraumatic. Nose: No congestion/rhinnorhea. Mouth/Throat: Mucous membranes are moist.   Neck: Painless ROM.  Cardiovascular:   Good peripheral circulation. Respiratory: Normal respiratory effort.  No retractions.  Gastrointestinal: Soft and nontender.  Musculoskeletal:  no deformity Neurologic:  MAE spontaneously. No gross focal neurologic deficits are appreciated.  Skin:  Skin is warm, dry and intact. No rash noted. Psychiatric: Mood and affect  are normal. Speech and behavior are normal.    ED Results / Procedures / Treatments   Labs (all labs ordered are listed, but only abnormal results are displayed) Labs Reviewed  BASIC METABOLIC PANEL - Abnormal; Notable for the following components:      Result Value   Potassium 3.4 (*)    Glucose, Bld 110 (*)    Creatinine, Ser 1.10 (*)    GFR, Estimated 53 (*)    All other components within normal limits  CBC  HEPATIC FUNCTION PANEL  LIPASE, BLOOD  TROPONIN I (HIGH SENSITIVITY)  TROPONIN I (HIGH SENSITIVITY)     EKG  ED ECG REPORT I, Willy Eddy, the attending physician, personally viewed and interpreted this ECG.   Date: 06/16/2022  EKG Time: 11:11  Rate: 65  Rhythm: sinus  Axis: normal  Intervals: normal  ST&T Change:     RADIOLOGY Please see ED Course for my review and interpretation.  I personally reviewed all radiographic images ordered to evaluate for the above acute complaints and reviewed radiology reports and findings.  These findings were personally discussed with the patient.  Please see medical record for radiology report.  PROCEDURES:  Critical Care performed: No  Procedures   MEDICATIONS ORDERED IN ED: Medications - No data to display   IMPRESSION / MDM / ASSESSMENT AND PLAN / ED COURSE  I reviewed the triage vital signs and the nursing notes.                              Differential diagnosis includes, but is not limited to, ACS, pericarditis, esophagitis, boerhaaves, pe, dissection, pna, bronchitis, costochondritis  Patient presenting to the ER for evaluation of symptoms as described above.  Based on symptoms, risk factors and considered above differential, this presenting complaint could reflect a potentially life-threatening illness therefore the patient will be placed on continuous pulse oximetry and telemetry for monitoring.  Laboratory evaluation will be sent to evaluate for the above complaints.  Based on presentation  question whether this is esophageal in etiology.  Her abdominal exam is soft and benign not consistent with biliary pathology or pancreatitis SBO.  EKG is nonischemic initial troponin is also negative.  Does not seem clinically consistent with PE or dissection given lack of tachycardia hypoxia and overall well appearance.  Delta troponins are negative.  Do suspect some component of esophagitis or reflux.  Given description of symptoms barium swallow ordered which on my review and interpretation does not show any evidence of obstruction but will await formal radiology report if negative will be appropriate for outpatient follow-up.       FINAL CLINICAL IMPRESSION(S) / ED DIAGNOSES   Final diagnoses:  Atypical chest pain     Rx / DC Orders   ED Discharge Orders     None        Note:  This document was prepared using Dragon voice recognition software and may include unintentional dictation errors.    Willy Eddy, MD 06/16/22 [phone]

## 2022-06-17 ENCOUNTER — Telehealth: Payer: Self-pay

## 2022-06-17 NOTE — Telephone Encounter (Signed)
Reviewed records.  Given symptoms and scan, etc - would like to see her for earlier appt. Glad to hear she is doing better.  Thanks.

## 2022-06-17 NOTE — Transitions of Care (Post Inpatient/ED Visit) (Signed)
I spoke with pt; pt experienced chest pain and ED advised pt after testing that pt had esophagitis. pt scheduled first available with Dr Norma Fredrickson GI 11/03/2022 and pt already has appt scheduled to see Dr Lorin Picket on 07/28/22 and pt will call Dr Laurelyn Sickle office for appt. Pt has contact info for all doctors offices. Pt is doing better after taking famotidine and Sucralfate. UC & ED precautions given and pt voiced understanding.sending note to Dr Lorin Picket.    06/17/2022  Name: Tabitha Lewis MRN: [id number] DOB: 1950/02/19  Today's TOC FU Call Status: Today's TOC FU Call Status:: Successful TOC FU Call Competed TOC FU Call Complete Date: 06/17/22  Transition Care Management Follow-up Telephone Call Date of Discharge: 06/16/22 Discharge Facility: [facility]) Type of Discharge: Emergency Department Reason for ED Visit: Other: (chest pain and ED advised pt after testing that esophagitis. pt scheduled first available with Dr Norma Fredrickson GI 11/03/2022 and pt already has appt scheduled to see Dr Lorin Picket and Dr End.) How have you been since you were released from the hospital?: Better (chest pain and ED advised pt after testing that esophagitis. pt scheduled first available with Dr Norma Fredrickson GI 11/03/2022 and pt already has appt scheduled to see Dr Lorin Picket and Dr End.) Any questions or concerns?: No  Items Reviewed: Did you receive and understand the discharge instructions provided?: Yes Medications obtained,verified, and reconciled?: Yes (Medications Reviewed) (pt taking Famotidine 20 mg one daily and Sucralfate 1 gm qid.) Any new allergies since your discharge?: No Dietary orders reviewed?: Yes Type of Diet Ordered:: given list of foods that could irritate the esophagus Do you have support at home?: Yes People in Home: child(ren), adult Name of Support/Comfort Primary Source: [name]  Medications Reviewed Today: Medications Reviewed Today     Reviewed by [name], MD  (Physician) on 05/19/22 at 1604  Med List Status: <None>   Medication Order Taking? Sig Documenting Provider Last Dose Status Informant  Cholecalciferol (VITAMIN D3 PO) [id number] Yes Take 2,000 Units by mouth daily. [provider] Taking Active   citalopram (CELEXA) 10 MG tablet [id number] Yes Take 1 tablet (10 mg total) by mouth daily. McLean-Scocuzza, Pasty Spillers, MD Taking Active   cyanocobalamin 1000 MCG tablet [id number] Yes Take 1,000 mcg by mouth daily. [provider] Taking Active   dapagliflozin propanediol (FARXIGA) 10 MG TABS tablet [id number] Yes Take 1 tablet (10 mg total) by mouth daily before breakfast. End, Cristal Deer, MD Taking Active   desoximetasone (TOPICORT) 0.25 % cream [id number] Yes Apply 1 Application topically 2 (two) times daily. [provider] Taking Active   docusate sodium (COLACE) 100 MG capsule [id number] Yes Take 1 capsule (100 mg total) by mouth 2 (two) times daily as needed for mild constipation. McLean-Scocuzza, Pasty Spillers, MD Taking Active   doxycycline (PERIOSTAT) 20 MG tablet [id number] No TAKE 1 TABLET (20 MG TOTAL) BY MOUTH 2 (TWO) TIMES DAILY. WITH FOOD  Patient not taking: Reported on 05/18/2022   McLean-Scocuzza, Pasty Spillers, MD Not Taking Active   doxycycline (VIBRAMYCIN) 100 MG capsule [id number] Yes Take 100 mg by mouth daily. For 2 months/temporary dose [provider] Taking Active   lisinopril-hydrochlorothiazide (ZESTORETIC) 20-12.5 MG tablet [id number] Yes Take 1 tablet by mouth daily. In am if BP too low <90<60 cut pill in 1/2 McLean-Scocuzza, Pasty Spillers, MD Taking Active   Multiple Vitamin (MULTIVITAMIN) capsule [id number] Yes Take 1 capsule by mouth daily. [provider] Taking Active   rosuvastatin (CRESTOR) 5 MG tablet [id number]  Yes Take 1 tablet (5 mg total) by mouth daily. End, Cristal Deer, MD Taking Active   traMADol (ULTRAM) 50 MG tablet [id number] Yes Take 1 tablet (50 mg total) by mouth every 12 (twelve) hours as  needed. Dale [place], MD Taking Active   traZODone (DESYREL) 100 MG tablet [id number] Yes Take 100 mg by mouth at bedtime as needed. [provider] Taking Active             Home Care and Equipment/Supplies: Were Home Health Services Ordered?: NA Any new equipment or medical supplies ordered?: NA  Functional Questionnaire: Do you need assistance with bathing/showering or dressing?: No Do you need assistance with meal preparation?: No Do you need assistance with eating?: No Do you have difficulty maintaining continence: No Do you need assistance with getting out of bed/getting out of a chair/moving?: No Do you have difficulty managing or taking your medications?: No  Follow up appointments reviewed: PCP Follow-up appointment confirmed?: Yes Date of PCP follow-up appointment?: 07/28/22 Follow-up Provider: Dr Lorin Picket [facility] Follow-up appointment confirmed?: No Date of Specialist follow-up appointment?: 11/03/22 Follow-Up Specialty Provider:: Dr Norma Fredrickson Do you need transportation to your follow-up appointment?: No Do you understand care options if your condition(s) worsen?: Yes-patient verbalized understanding    SIGNATURE Lewanda Rife, LPN

## 2022-06-22 NOTE — Telephone Encounter (Signed)
Called patient to try to move appt up. Patient stated that she is going to the beach this month and her appt is early July anyways so she would like to keep as is

## 2022-06-30 DIAGNOSIS — D225 Melanocytic nevi of trunk: Secondary | ICD-10-CM | POA: Diagnosis not present

## 2022-06-30 DIAGNOSIS — L309 Dermatitis, unspecified: Secondary | ICD-10-CM | POA: Diagnosis not present

## 2022-06-30 DIAGNOSIS — D2262 Melanocytic nevi of left upper limb, including shoulder: Secondary | ICD-10-CM | POA: Diagnosis not present

## 2022-06-30 DIAGNOSIS — L821 Other seborrheic keratosis: Secondary | ICD-10-CM | POA: Diagnosis not present

## 2022-06-30 DIAGNOSIS — L578 Other skin changes due to chronic exposure to nonionizing radiation: Secondary | ICD-10-CM | POA: Diagnosis not present

## 2022-06-30 DIAGNOSIS — D2272 Melanocytic nevi of left lower limb, including hip: Secondary | ICD-10-CM | POA: Diagnosis not present

## 2022-06-30 DIAGNOSIS — L590 Erythema ab igne [dermatitis ab igne]: Secondary | ICD-10-CM | POA: Diagnosis not present

## 2022-06-30 DIAGNOSIS — D2261 Melanocytic nevi of right upper limb, including shoulder: Secondary | ICD-10-CM | POA: Diagnosis not present

## 2022-06-30 DIAGNOSIS — D2271 Melanocytic nevi of right lower limb, including hip: Secondary | ICD-10-CM | POA: Diagnosis not present

## 2022-07-15 ENCOUNTER — Telehealth: Payer: Self-pay | Admitting: Internal Medicine

## 2022-07-15 DIAGNOSIS — R739 Hyperglycemia, unspecified: Secondary | ICD-10-CM

## 2022-07-15 DIAGNOSIS — E785 Hyperlipidemia, unspecified: Secondary | ICD-10-CM

## 2022-07-15 NOTE — Telephone Encounter (Signed)
Patient need orders  °

## 2022-07-15 NOTE — Telephone Encounter (Signed)
Labs ordered.

## 2022-07-25 ENCOUNTER — Other Ambulatory Visit (INDEPENDENT_AMBULATORY_CARE_PROVIDER_SITE_OTHER): Payer: Medicare PPO

## 2022-07-25 DIAGNOSIS — R739 Hyperglycemia, unspecified: Secondary | ICD-10-CM | POA: Diagnosis not present

## 2022-07-25 DIAGNOSIS — E785 Hyperlipidemia, unspecified: Secondary | ICD-10-CM | POA: Diagnosis not present

## 2022-07-25 LAB — BASIC METABOLIC PANEL
BUN: 17 mg/dL (ref 6–23)
CO2: 29 mEq/L (ref 19–32)
Calcium: 9.3 mg/dL (ref 8.4–10.5)
Chloride: 100 mEq/L (ref 96–112)
Creatinine, Ser: 1.03 mg/dL (ref 0.40–1.20)
GFR: 53.74 mL/min — ABNORMAL LOW (ref >60.00)
Glucose, Bld: 112 mg/dL — ABNORMAL HIGH (ref 70–99)
Potassium: 3.5 mEq/L (ref 3.5–5.1)
Sodium: 139 mEq/L (ref 135–145)

## 2022-07-25 LAB — LIPID PANEL
Cholesterol: 146 mg/dL (ref 0–200)
HDL: 55.3 mg/dL (ref >39.00)
LDL Cholesterol: 68 mg/dL (ref 0–99)
NonHDL: 90.97
Total CHOL/HDL Ratio: 3
Triglycerides: 113 mg/dL (ref 0.0–149.0)
VLDL: 22.6 mg/dL (ref 0.0–40.0)

## 2022-07-25 LAB — HEMOGLOBIN A1C: Hgb A1c MFr Bld: 6.3 % (ref 4.6–6.5)

## 2022-07-25 LAB — HEPATIC FUNCTION PANEL
ALT: 13 U/L (ref 0–35)
AST: 16 U/L (ref 0–37)
Albumin: 4.1 g/dL (ref 3.5–5.2)
Alkaline Phosphatase: 62 U/L (ref 39–117)
Bilirubin, Direct: 0.2 mg/dL (ref 0.0–0.3)
Total Bilirubin: 0.9 mg/dL (ref 0.2–1.2)
Total Protein: 6.6 g/dL (ref 6.0–8.3)

## 2022-07-28 ENCOUNTER — Ambulatory Visit (INDEPENDENT_AMBULATORY_CARE_PROVIDER_SITE_OTHER): Payer: Medicare PPO | Admitting: Internal Medicine

## 2022-07-28 ENCOUNTER — Encounter: Payer: Self-pay | Admitting: Internal Medicine

## 2022-07-28 VITALS — BP 112/70 | HR 84 | Temp 98.0°F | Resp 16 | Ht 65.0 in | Wt 172.6 lb

## 2022-07-28 DIAGNOSIS — I700 Atherosclerosis of aorta: Secondary | ICD-10-CM | POA: Diagnosis not present

## 2022-07-28 DIAGNOSIS — M1712 Unilateral primary osteoarthritis, left knee: Secondary | ICD-10-CM | POA: Diagnosis not present

## 2022-07-28 DIAGNOSIS — D649 Anemia, unspecified: Secondary | ICD-10-CM | POA: Diagnosis not present

## 2022-07-28 DIAGNOSIS — E785 Hyperlipidemia, unspecified: Secondary | ICD-10-CM | POA: Diagnosis not present

## 2022-07-28 DIAGNOSIS — I5032 Chronic diastolic (congestive) heart failure: Secondary | ICD-10-CM

## 2022-07-28 DIAGNOSIS — Z801 Family history of malignant neoplasm of trachea, bronchus and lung: Secondary | ICD-10-CM

## 2022-07-28 DIAGNOSIS — Z803 Family history of malignant neoplasm of breast: Secondary | ICD-10-CM

## 2022-07-28 DIAGNOSIS — F419 Anxiety disorder, unspecified: Secondary | ICD-10-CM

## 2022-07-28 DIAGNOSIS — Z0000 Encounter for general adult medical examination without abnormal findings: Secondary | ICD-10-CM | POA: Diagnosis not present

## 2022-07-28 DIAGNOSIS — F320 Major depressive disorder, single episode, mild: Secondary | ICD-10-CM

## 2022-07-28 DIAGNOSIS — E1165 Type 2 diabetes mellitus with hyperglycemia: Secondary | ICD-10-CM | POA: Diagnosis not present

## 2022-07-28 DIAGNOSIS — I10 Essential (primary) hypertension: Secondary | ICD-10-CM

## 2022-07-28 DIAGNOSIS — Z1379 Encounter for other screening for genetic and chromosomal anomalies: Secondary | ICD-10-CM

## 2022-07-28 DIAGNOSIS — R739 Hyperglycemia, unspecified: Secondary | ICD-10-CM

## 2022-07-28 DIAGNOSIS — Z1231 Encounter for screening mammogram for malignant neoplasm of breast: Secondary | ICD-10-CM

## 2022-07-28 DIAGNOSIS — Z800 Family history of malignant neoplasm of digestive organs: Secondary | ICD-10-CM

## 2022-07-28 MED ORDER — FAMOTIDINE 20 MG PO TABS: 20.0000 mg | ORAL_TABLET | Freq: Every day | ORAL | 3 refills | Status: DC

## 2022-07-28 MED ORDER — CITALOPRAM HYDROBROMIDE 10 MG PO TABS: 10.0000 mg | ORAL_TABLET | Freq: Every day | ORAL | 3 refills | Status: DC

## 2022-07-28 MED ORDER — LISINOPRIL-HYDROCHLOROTHIAZIDE 20-12.5 MG PO TABS: 1.0000 | ORAL_TABLET | Freq: Every day | ORAL | 3 refills | Status: DC

## 2022-07-28 NOTE — Assessment & Plan Note (Addendum)
Physical today 07/28/22.  Mammogram 11/17/21 - Birads I.  Cologuard 01/2020 - negative.

## 2022-07-28 NOTE — Progress Notes (Signed)
Subjective:    Patient ID: Tabitha Lewis, female    DOB: 08/20/1950, 72 y.o.   MRN: [id number]  Patient here for  Chief Complaint  Patient presents with   Annual Exam    HPI Here for a physical exam.  History of diabetes, hypertension and hypercholesterolemia. Saw Dr End 11/10/21 - CTA. 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  Echo revealed normal LVEF with grade 2 diastolic dysfunction. No significant valvular abnormality was seen.  Started on crestor and farxiga. ABIs - 12/16/21 - normal. Had f/u with Dr End 05/18/22 - started on magnesium - palpitations. Seen in ER 06/16/22 - chest pain. Barium swallow - ok.  Started on pepcid.  Initially took carafate.  Off now.  Has done well since discharge.  No chest pain or swallowing issues now.  No abdominal pain.  Bowels moving. Persistent back pain.  Has known spinal stenosis.  Has been evaluated.  Does have left knee pain.  Limits activity.  Plans to discuss with ortho.  Desires no further intervention right now.     Past Medical History:  Diagnosis Date   AKI (acute kidney injury) (HCC)    03/2020-04/2020   Allergy    i.e contact allergies [facility]; also plants, trees    Chicken pox    Depression    Diabetes mellitus without complication (HCC)    DIET CONTROLLED   Family history of breast cancer    BRCA negative in the past   Family history of breast cancer    Family history of lung cancer    Family history of rectal cancer    History of prediabetes    Hypertension    Interstitial cystitis    Interstitial cystitis    Spinal stenosis    UTI (urinary tract infection)    UTI (urinary tract infection)    Past Surgical History:  Procedure Laterality Date   CATARACT EXTRACTION Bilateral    b/l Dr. 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   Procedure: TOTAL HIP ARTHROPLASTY ANTERIOR APPROACH;  Surgeon: Kennedy Bucker, MD;  Location: [facility] ORS;  Service: Orthopedics;   Laterality: Right;   Family History  Problem Relation Age of Onset   Heart attack Mother 34   Breast cancer Mother 60       s/p b/l masectomy    COPD Mother    Heart failure Mother    Stroke Father        age 13    Breast cancer Sister 15   Breast cancer Maternal Aunt    Breast cancer Maternal Grandmother    Other Daughter        benign brain tumor   Polycystic ovary syndrome Daughter    Stroke Other        grandmother age 33    Lupus Other    Social History   Socioeconomic History   Marital status: Widowed    Spouse name: Not on file   Number of children: Not on file   Years of education: Not on file   Highest education level: Not on file  Occupational History   Not on file  Tobacco Use   Smoking status: Former    Current packs/day: 0.00    Average packs/day: 1 pack/day for 7.0 years (7.0 ttl pk-yrs)    Types: Cigarettes    Start date: 43    Quit date: 43    Years since quitting: 51.5   Smokeless tobacco:  Never  Vaping Use   Vaping status: Never Used  Substance and Sexual Activity   Alcohol use: No   Drug use: No   Sexual activity: Not on file  Other Topics Concern   Not on file  Social History Narrative   1 daughter and 1 son    3 grandsons    Married to high school sweet heart (husband) x 47 years dx'ed with prostate cancer in 2015/08/11 he died 06/10/17    Retired Optometrist education [employer]   Social Determinants of Health   Financial Resource Strain: Low Risk  (02/22/2021)   Overall Financial Resource Strain (CARDIA)    Difficulty of Paying Living Expenses: Not hard at all  Food Insecurity: No Food Insecurity (02/22/2021)   Hunger Vital Sign    Worried About Running Out of Food in the Last Year: Never true    Ran Out of Food in the Last Year: Never true  Transportation Needs: No Transportation Needs (02/22/2021)   PRAPARE - Administrator, Civil Service (Medical): No    Lack of Transportation (Non-Medical): No  Physical  Activity: Insufficiently Active (02/20/2020)   Exercise Vital Sign    Days of Exercise per Week: 1 day    Minutes of Exercise per Session: 60 min  Stress: No Stress Concern Present (02/22/2021)   [employer] of Occupational Health - Occupational Stress Questionnaire    Feeling of Stress : Only a little  Social Connections: Unknown (02/22/2021)   Social Connection and Isolation Panel [NHANES]    Frequency of Communication with Friends and Family: More than three times a week    Frequency of Social Gatherings with Friends and Family: More than three times a week    Attends Religious Services: Not on Marketing executive or Organizations: Yes    Attends Banker Meetings: 1 to 4 times per year    Marital Status: Married     Review of Systems  Constitutional:  Negative for appetite change and unexpected weight change.  HENT:  Negative for congestion, sinus pressure and sore throat.   Eyes:  Negative for pain and visual disturbance.  Respiratory:  Negative for cough, chest tightness and shortness of breath.   Cardiovascular:  Negative for chest pain, palpitations and leg swelling.  Gastrointestinal:  Negative for abdominal pain, diarrhea, rectal pain and vomiting.  Genitourinary:  Negative for difficulty urinating and dysuria.  Musculoskeletal:  Positive for back pain.       Left knee pain.   Skin:  Negative for color change and rash.  Neurological:  Negative for dizziness and headaches.  Hematological:  Negative for adenopathy. Does not bruise/bleed easily.  Psychiatric/Behavioral:  Negative for agitation and dysphoric mood.        Objective:     BP 112/70   Pulse 84   Temp 98 F (36.7 C)   Resp 16   Ht 5\' 5"  (1.651 m)   Wt 172 lb 9.6 oz (78.3 kg)   SpO2 98%   BMI 28.72 kg/m  Wt Readings from Last 3 Encounters:  07/28/22 172 lb 9.6 oz (78.3 kg)  06/16/22 172 lb (78 kg)  05/18/22 173 lb 9.6 oz (78.7 kg)    Physical Exam Vitals reviewed.   Constitutional:      General: She is not in acute distress.    Appearance: Normal appearance. She is well-developed.  HENT:     Head: Normocephalic and atraumatic.  Right Ear: External ear normal.     Left Ear: External ear normal.  Eyes:     General: No scleral icterus.       Right eye: No discharge.        Left eye: No discharge.     Conjunctiva/sclera: Conjunctivae normal.  Neck:     Thyroid: No thyromegaly.  Cardiovascular:     Rate and Rhythm: Normal rate and regular rhythm.  Pulmonary:     Effort: No tachypnea, accessory muscle usage or respiratory distress.     Breath sounds: Normal breath sounds. No decreased breath sounds or wheezing.  Chest:  Breasts:    Right: No inverted nipple, mass, nipple discharge or tenderness (no axillary adenopathy).     Left: No inverted nipple, mass, nipple discharge or tenderness (no axilarry adenopathy).  Abdominal:     General: Bowel sounds are normal.     Palpations: Abdomen is soft.     Tenderness: There is no abdominal tenderness.  Musculoskeletal:        General: No swelling or tenderness.     Cervical back: Neck supple. No tenderness.  Lymphadenopathy:     Cervical: No cervical adenopathy.  Skin:    Findings: No erythema or rash.  Neurological:     Mental Status: She is alert and oriented to person, place, and time.  Psychiatric:        Mood and Affect: Mood normal.        Behavior: Behavior normal.      Outpatient Encounter Medications as of 07/28/2022  Medication Sig   Cholecalciferol (VITAMIN D3 PO) Take 2,000 Units by mouth daily.   citalopram (CELEXA) 10 MG tablet Take 1 tablet (10 mg total) by mouth daily.   cyanocobalamin 1000 MCG tablet Take 1,000 mcg by mouth daily.   dapagliflozin propanediol (FARXIGA) 10 MG TABS tablet Take 1 tablet (10 mg total) by mouth daily before breakfast.   desoximetasone (TOPICORT) 0.25 % cream Apply 1 Application topically 2 (two) times daily.   docusate sodium (COLACE) 100 MG  capsule Take 1 capsule (100 mg total) by mouth 2 (two) times daily as needed for mild constipation.   doxycycline (VIBRAMYCIN) 100 MG capsule Take 100 mg by mouth daily. For 2 months/temporary dose   famotidine (PEPCID) 20 MG tablet Take 1 tablet (20 mg total) by mouth daily.   lisinopril-hydrochlorothiazide (ZESTORETIC) 20-12.5 MG tablet Take 1 tablet by mouth daily. In am if BP too low <90<60 cut pill in 1/2   Multiple Vitamin (MULTIVITAMIN) capsule Take 1 capsule by mouth daily.   rosuvastatin (CRESTOR) 5 MG tablet Take 1 tablet (5 mg total) by mouth daily.   traMADol (ULTRAM) 50 MG tablet Take 1 tablet (50 mg total) by mouth every 12 (twelve) hours as needed.   traZODone (DESYREL) 100 MG tablet Take 100 mg by mouth at bedtime as needed.   [DISCONTINUED] citalopram (CELEXA) 10 MG tablet Take 1 tablet (10 mg total) by mouth daily.   [DISCONTINUED] doxycycline (PERIOSTAT) 20 MG tablet TAKE 1 TABLET (20 MG TOTAL) BY MOUTH 2 (TWO) TIMES DAILY. WITH FOOD (Patient not taking: Reported on 05/18/2022)   [DISCONTINUED] famotidine (PEPCID) 20 MG tablet Take 1 tablet (20 mg total) by mouth daily.   [DISCONTINUED] lisinopril-hydrochlorothiazide (ZESTORETIC) 20-12.5 MG tablet Take 1 tablet by mouth daily. In am if BP too low <90<60 cut pill in 1/2   [DISCONTINUED] sucralfate (CARAFATE) 1 g tablet Take 1 tablet (1 g total) by mouth 4 (four) times daily. (Patient  not taking: Reported on 07/28/2022)   No facility-administered encounter medications on file as of 07/28/2022.     Lab Results  Component Value Date   WBC 5.5 06/16/2022   HGB 13.3 06/16/2022   HCT 41.0 06/16/2022   PLT 232 06/16/2022   GLUCOSE 112 (H) 07/25/2022   CHOL 146 07/25/2022   TRIG 113.0 07/25/2022   HDL 55.30 07/25/2022   LDLCALC 68 07/25/2022   ALT 13 07/25/2022   AST 16 07/25/2022   NA 139 07/25/2022   K 3.5 07/25/2022   CL 100 07/25/2022   CREATININE 1.03 07/25/2022   BUN 17 07/25/2022   CO2 29 07/25/2022   TSH 3.08  03/10/2022   HGBA1C 6.3 07/25/2022    DG ESOPHAGUS W DOUBLE CM (HD)  Result Date: 06/16/2022 INDICATION: Patient with pain while swallowing starting 2 days ago, no recent procedures and no recent episodes of vomiting. EXAM: ESOPHAGUS/BARIUM SWALLOW/TABLET STUDY TECHNIQUE: Combined double and single contrast examination was performed using effervescent crystals, Omnipaque 300, thick barium liquid and thin barium liquid. The patient was observed with fluoroscopy swallowing a 13 mm barium sulphate tablet. FLUOROSCOPY TIME:  Radiation Exposure Index (as provided by the fluoroscopic device): 81.10 mGy COMPARISON:  NONE. PROCEDURE: Normal pharyngeal anatomy and motility. No laryngeal penetration or tracheal aspiration. Contrast flowed freely through the esophagus with inflammatory mucosal changes seen. Esophageal mucosa with evidence of irregularity and inflammatory changes. No esophageal dysmotility is seen. Severe spontaneous gastroesophageal reflux extending to the mid esophagus. Small sliding hiatal hernia was seen. A 13 mm barium tablet was administered, however narrowing is difficult to evaluate given the inflammatory changes throughout the esophagus. Additional cups of water and barium including warm water was utilized for passage of the barium tablet. COMPLICATIONS: NONE. IMPRESSION: 1. Thickened and irregular esophageal mucosa. Findings are concerning for esophagitis, consider further evaluation with endoscopy. 2.  Small sliding hiatal hernia seen. 3.  Severe gastroesophageal reflux seen extending to mid esophagus. This exam was performed by Pattricia Boss PA-C, and was supervised and interpreted by Dr. Dorothey Baseman. Electronically Signed   By: Allegra Lai M.D.   On: 06/16/2022 16:43   DG Chest 2 View  Result Date: 06/16/2022 CLINICAL DATA:  Chest pain EXAM: CHEST - 2 VIEW COMPARISON:  X-ray 09/08/2021 FINDINGS: Hyperinflation. No consolidation, pneumothorax or effusion. No edema. Normal  cardiopericardial silhouette. Degenerative changes seen along the spine. IMPRESSION: Hyperinflation.  No acute cardiopulmonary disease. Electronically Signed   By: Karen Kays M.D.   On: 06/16/2022 11:34       Assessment & Plan:  Healthcare maintenance Assessment & Plan: Physical today 07/28/22.  Mammogram 11/17/21 - Birads I.  Cologuard 01/2020 - negative.    Visit for screening mammogram -     3D Screening Mammogram, Left and Right; Future  Hyperglycemia -     Hemoglobin A1c; Future  Hyperlipidemia, unspecified hyperlipidemia type -     Lipid panel; Future -     Hepatic function panel; Future -     Basic metabolic panel; Future  Anxiety Assessment & Plan: Documented history of anxiety.  On celexa.  Appears to be doing well.  Continue.    Hypertension, unspecified type Assessment & Plan: Blood pressure as outlined.  Continue lisinopril/hctz.  On farxiga.  Check metabolic panel.    Anemia, unspecified type Assessment & Plan: Follow cbc.    Aortic atherosclerosis (HCC) Assessment & Plan: Crestor.    Arthritis of left knee Assessment & Plan: With left knee pain as outlined.  Plans  to d/w ortho.     Chronic heart failure with preserved ejection fraction (HFpEF) (HCC) Assessment & Plan: NYHA class II HFpEF. Recent echocardiogram showed LVEF of 55-60% with grade 2 diastolic dysfunction. Followed by Dr End.  Added dapagliflozin 10 mg (12/2021).  No chest pain or sob reported.     Controlled type 2 diabetes mellitus with hyperglycemia, without long-term current use of insulin (HCC) Assessment & Plan: On farxiga. Low carb diet and exercise.  Follow met b and A1c.     Depression, major, single episode, mild (HCC) Assessment & Plan: On celexa.  Appears to be doing well.  Follow.    Essential hypertension Assessment & Plan: Blood pressure as outlined.  Continue lisinopril/hctz.  Also on farxiga.  Follow pressures.  Check metabolic panel.    Family history of  breast cancer Assessment & Plan: Has had genetic testing.  Review.  Since had previous testing, refer back to confirm if any further evaluation/follow up or testing warranted.    Family history of lung cancer Assessment & Plan: Has had genetic testing.  Review.  Since had previous testing, refer back to confirm if any further evaluation/follow up or testing warranted.    Family history of rectal cancer Assessment & Plan: Has had genetic testing.  Review.  Since had previous testing, refer back to confirm if any further evaluation/follow up or testing warranted.    Genetic testing Assessment & Plan: Has had genetic testing.  Review.  Since had previous testing, refer back to confirm if any further evaluation/follow up or testing warranted.   Orders: -     Ambulatory referral to Genetics  Hyperlipidemia LDL goal <70 Assessment & Plan: Continue crestor.  Check lipid panel and liver function tests.     Other orders -     Citalopram Hydrobromide; Take 1 tablet (10 mg total) by mouth daily.  Dispense: 90 tablet; Refill: 3 -     Famotidine; Take 1 tablet (20 mg total) by mouth daily.  Dispense: 90 tablet; Refill: 3 -     Lisinopril-hydroCHLOROthiazide; Take 1 tablet by mouth daily. In am if BP too low <90<60 cut pill in 1/2  Dispense: 90 tablet; Refill: 3     Dale Paxton, MD

## 2022-08-02 ENCOUNTER — Encounter: Payer: Self-pay | Admitting: Internal Medicine

## 2022-08-02 NOTE — Assessment & Plan Note (Signed)
Blood pressure as outlined.  Continue lisinopril/hctz.  Also on farxiga.  Follow pressures.  Check metabolic panel.

## 2022-08-02 NOTE — Assessment & Plan Note (Signed)
Continue crestor.  Check lipid panel and liver function tests.

## 2022-08-02 NOTE — Assessment & Plan Note (Signed)
 -   Crestor 

## 2022-08-02 NOTE — Assessment & Plan Note (Signed)
On farxiga. Low carb diet and exercise.  Follow met b and A1c.

## 2022-08-02 NOTE — Assessment & Plan Note (Signed)
On celexa.  Appears to be doing well.  Follow.

## 2022-08-02 NOTE — Assessment & Plan Note (Signed)
With left knee pain as outlined.  Plans to d/w ortho.

## 2022-08-02 NOTE — Assessment & Plan Note (Signed)
Follow cbc.  

## 2022-08-02 NOTE — Assessment & Plan Note (Signed)
Has had genetic testing.  Review.  Since had previous testing, refer back to confirm if any further evaluation/follow up or testing warranted.

## 2022-08-02 NOTE — Assessment & Plan Note (Signed)
NYHA class II HFpEF. Recent echocardiogram showed LVEF of 55-60% with grade 2 diastolic dysfunction. Followed by Dr End.  Added dapagliflozin 10 mg (12/2021).  No chest pain or sob reported.

## 2022-08-02 NOTE — Assessment & Plan Note (Signed)
 Documented history of anxiety.  On celexa.  Appears to be doing well.  Continue.

## 2022-08-02 NOTE — Assessment & Plan Note (Signed)
Blood pressure as outlined.  Continue lisinopril/hctz.  On farxiga.  Check metabolic panel.

## 2022-08-18 ENCOUNTER — Inpatient Hospital Stay: Payer: Medicare PPO

## 2022-08-18 ENCOUNTER — Inpatient Hospital Stay: Payer: Medicare PPO | Admitting: Licensed Clinical Social Worker

## 2022-08-30 ENCOUNTER — Encounter: Payer: Self-pay | Admitting: Licensed Clinical Social Worker

## 2022-08-30 ENCOUNTER — Inpatient Hospital Stay: Payer: Medicare PPO | Attending: Oncology | Admitting: Licensed Clinical Social Worker

## 2022-08-30 ENCOUNTER — Inpatient Hospital Stay: Payer: Medicare PPO

## 2022-08-30 DIAGNOSIS — R739 Hyperglycemia, unspecified: Secondary | ICD-10-CM

## 2022-08-30 DIAGNOSIS — Z1371 Encounter for nonprocreative screening for genetic disease carrier status: Secondary | ICD-10-CM | POA: Diagnosis not present

## 2022-08-30 DIAGNOSIS — Z803 Family history of malignant neoplasm of breast: Secondary | ICD-10-CM | POA: Diagnosis not present

## 2022-08-30 DIAGNOSIS — E785 Hyperlipidemia, unspecified: Secondary | ICD-10-CM

## 2022-08-30 NOTE — Progress Notes (Signed)
REFERRING PROVIDER: Dale [place], MD [address] [place],  [place] [address]  PRIMARY PROVIDER:  Dale , MD  PRIMARY REASON FOR VISIT:  1. Family history of breast cancer      HISTORY OF PRESENT ILLNESS:   Tabitha Lewis, a 72 y.o. female, was seen for a [facility] cancer genetics consultation at the request of Dr. Lorin Picket due to a family history of breast cancer.  Tabitha Lewis presents to clinic today to discuss the possibility of a hereditary predisposition to cancer, genetic testing, and to further clarify her future cancer risks, as well as potential cancer risks for family members.   CANCER HISTORY:  Tabitha Lewis is a 72 y.o. female with no personal history of cancer.   She reports history of BRCA1/2 testing that was negative >10 years ago.  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  RISK FACTORS:  Menarche was at age 3.  First live birth at age 1.  OCP use for approximately 5 years.  Ovaries intact: yes.  Hysterectomy: no.  Menopausal status: postmenopausal.  Colonoscopy: yes; normal. Mammogram within the last year: yes.  Past Medical History:  Diagnosis Date   AKI (acute kidney injury) (HCC)    03/2020-04/2020   Allergy    i.e contact allergies [place]; also plants, trees    Chicken pox    Depression    Diabetes mellitus without complication (HCC)    DIET CONTROLLED   Family history of breast cancer    BRCA negative in the past   Family history of breast cancer    Family history of lung cancer    Family history of rectal cancer    History of prediabetes    Hypertension    Interstitial cystitis    Interstitial cystitis    Spinal stenosis    UTI (urinary tract infection)    UTI (urinary tract infection)     Past Surgical History:  Procedure Laterality Date    CATARACT EXTRACTION Bilateral    b/l Dr. 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  Significant diagnoses are listed below: Family History  Problem Relation Age of Onset   Heart attack Mother 48   Breast cancer Mother 28       dx 30, 80   COPD Mother    Heart failure Mother    Leukemia Mother    Stroke Father        age 71    Breast cancer Sister 49   Breast cancer Maternal Aunt        dx >50   Lung cancer Maternal Uncle    Breast cancer Maternal Grandmother    Other Daughter        benign brain tumor   Polycystic ovary syndrome Daughter    Stroke Other        grandmother age 32    Lupus Other    Breast cancer Cousin        mat cousins x4   Tabitha Lewis has 1 son, in his 43s, and 1 daughter, Tabitha Lewis, 15. No known cancer diagnoses. She has one sister, Tabitha Lewis, who had  breast cancer at 29 and genetic testing. She is living at 31.   Tabitha Lewis's mother was diagnosed with breast cancer in her left breast at 9, and in her right breast at 5. She also may have had leukemia. The patient had 3 maternal uncles, and 1 maternal aunt. Her aunt had breast cancer, unsure of age of dx, and died in her 51s. All four of her daughters had breast cancer as well. An uncle had lung cancer and history of smoking.  Her maternal grandmother died at 48 of a stroke and had history of breast cancer, grandfather died around 63.   Tabitha Lewis father died at 21 of a stroke, no cancers. Patient had 2 paternal uncles, one had lung cancer and history of smoking. No known cancers in paternal cousins. No known cancers in paternal grandparents. Her grandmother died in her 63s and grandfather died at 68.    Tabitha Lewis is aware of previous family history of genetic testing for  hereditary cancer risks. Patient's maternal ancestors are of unknown descent, and paternal ancestors are of [place] descent. There is no reported Ashkenazi Jewish ancestry. There is no known consanguinity.    GENETIC COUNSELING ASSESSMENT: Tabitha Lewis is a 72 y.o. female with a family history of breast cancer which is somewhat suggestive of a hereditary cancer syndrome and predisposition to cancer. We, therefore, discussed and recommended the following at today's visit.   DISCUSSION: We discussed that approximately 10% of breast cancer is hereditary. Most cases of hereditary breast cancer are associated with BRCA1/BRCA2 genes, although there are other genes associated with hereditary breast cancer as well that she has not had tested yet. Cancers and risks are gene specific. We discussed that testing is beneficial for several reasons including knowing about cancer risks, identifying potential screening and risk-reduction options that may be appropriate, and to understand if other family members could be at risk for cancer and allow them to undergo genetic testing.   We reviewed the characteristics, features and inheritance patterns of hereditary cancer syndromes. We also discussed genetic testing, including the appropriate family members to test, the process of testing, insurance coverage and turn-around-time for results. We discussed the implications of a negative, positive and/or variant of uncertain significant result. We recommended Tabitha Lewis pursue genetic testing for the [facility] Multi-Cancer+RNA gene panel.   Based on Tabitha Lewis's family history of cancer, she meets medical criteria for genetic testing. Despite that she meets criteria, she may still have an out of pocket cost.   PLAN: After considering the risks, benefits, and limitations, Tabitha Lewis provided informed consent to pursue genetic testing and the blood sample was sent to [facility] for analysis of the  Multi-Cancer+RNA panel. Results should be available within approximately 2-3 weeks' time, at which point they will be disclosed by telephone to Tabitha Lewis, as will any additional recommendations warranted by these results. Tabitha Lewis will receive a summary of her genetic counseling visit and a copy of her results once available. This information will also be available in Epic.   Based on Tabitha Lewis's family history, we recommended her sister have genetic counseling and possibly updated testing. Tabitha Lewis will let us know if we can be of any assistance in coordinating genetic counseling and/or testing for this family member.   Tabitha Lewis questions were answered to her satisfaction today. Our contact information was provided should additional questions or concerns arise. Thank you for the referral and allowing [place] to share in the care of your  patient.   Lacy Duverney, MS, [facility] Genetic Counselor [email or web address] Phone: [phone]  The patient was seen for a total of 30 minutes in face-to-face genetic counseling.  Patient's daughter Tabitha Lewis was present via phone. Dr. Blake Divine was available for discussion regarding this case.   _______________________________________________________________________ For Office Staff:  Number of people involved in session: 2 Was an Intern/ student involved with case: no

## 2022-09-08 ENCOUNTER — Other Ambulatory Visit: Payer: Self-pay | Admitting: Internal Medicine

## 2022-09-08 DIAGNOSIS — E1165 Type 2 diabetes mellitus with hyperglycemia: Secondary | ICD-10-CM

## 2022-09-08 NOTE — Progress Notes (Signed)
Placed order for u[facility]CR to close metric KED

## 2022-09-14 ENCOUNTER — Telehealth: Payer: Self-pay | Admitting: Licensed Clinical Social Worker

## 2022-09-14 NOTE — Telephone Encounter (Signed)
Patient called concerned because she received denial letter from her insurance for genetic testing. I assured her that [facility] appeals on her behalf and that based on her insurance type she will likely not owe anything for testing. We also discovered that her sample had never been received by the [facility]. The cancer center lab provided me with tracking which showed [facility] never received sample even though it was taken to shipping facility. Offered to have Tabitha Lewis's blood redrawn but she wishes to cancel the test at this time as it has become too complicated.   Tabitha Duverney, MS, [facility] Genetic Counselor [email or web address] Phone: [phone]

## 2022-10-18 ENCOUNTER — Encounter: Payer: Self-pay | Admitting: Emergency Medicine

## 2022-10-19 ENCOUNTER — Ambulatory Visit (INDEPENDENT_AMBULATORY_CARE_PROVIDER_SITE_OTHER): Payer: Medicare PPO | Admitting: Emergency Medicine

## 2022-10-19 VITALS — Ht 65.0 in | Wt 170.0 lb

## 2022-10-19 DIAGNOSIS — Z0000 Encounter for general adult medical examination without abnormal findings: Secondary | ICD-10-CM

## 2022-10-19 DIAGNOSIS — Z1211 Encounter for screening for malignant neoplasm of colon: Secondary | ICD-10-CM | POA: Diagnosis not present

## 2022-10-19 DIAGNOSIS — Z1212 Encounter for screening for malignant neoplasm of rectum: Secondary | ICD-10-CM

## 2022-10-19 NOTE — Patient Instructions (Addendum)
Tabitha Lewis , Thank you for taking time to come for your Medicare Wellness Visit. I appreciate your ongoing commitment to your health goals. Please review the following plan we discussed and let me know if I can assist you in the future.   Referrals/Orders/Follow-Ups/Clinician Recommendations: Get the flu and shingles vaccines at your earliest convenience. I have requested a copy of your last eye exam from Dr. Druscilla Brownie. I have placed an order for the cologuard, which is due after 02/12/23. Get a diabetic foot exam at your next OV with Dr. Lorin Picket on 11/29/22.  This is a list of the screening recommended for you and due dates:  Health Maintenance  Topic Date Due   Yearly kidney health urinalysis for diabetes  05/07/2021   Complete foot exam   11/26/2021   Eye exam for diabetics  05/20/2022   COVID-19 Vaccine (7 - 2023-24 season) 09/18/2022   Zoster (Shingles) Vaccine (1 of 2) 10/28/2022*   Flu Shot  04/17/2023*   Hemoglobin A1C  01/25/2023   Cologuard (Stool DNA test)  02/12/2023   Yearly kidney function blood test for diabetes  07/25/2023   Medicare Annual Wellness Visit  10/19/2023   Mammogram  11/16/2023   DEXA scan (bone density measurement)  11/16/2023   DTaP/Tdap/Td vaccine (2 - Td or Tdap) 07/21/2025   Pneumonia Vaccine  Completed   Hepatitis C Screening  Completed   HPV Vaccine  Aged Out   Colon Cancer Screening  Discontinued  *Topic was postponed. The date shown is not the original due date.    Advanced directives: (Copy Requested) Please bring a copy of your health care power of attorney and living will to the office to be added to your chart at your convenience.  Next Medicare Annual Wellness Visit scheduled for next year: Yes, 10/25/23 @ 12:45 pm

## 2022-10-19 NOTE — Progress Notes (Signed)
Subjective:   Tabitha Lewis is a 72 y.o. female who presents for Medicare Annual (Subsequent) preventive examination.  Visit Complete: Virtual  I connected with  Wandra Scot on 10/19/22 by a audio enabled telemedicine application and verified that I am speaking with the correct person using two identifiers.  Patient Location: Home  Provider Location: Home Office  I discussed the limitations of evaluation and management by telemedicine. The patient expressed understanding and agreed to proceed.  Because this visit was a virtual/telehealth visit, some criteria may be missing or patient reported. Any vitals not documented were not able to be obtained and vitals that have been documented are patient reported.    Cardiac Risk Factors include: advanced age (>14men, >23 women);diabetes mellitus;dyslipidemia;hypertension;Other (see comment), Risk factor comments: CAD     Objective:    Today's Vitals   10/19/22 1410 10/19/22 1411  Weight: 170 lb (77.1 kg)   Height: 5\' 5"  (1.651 m)   PainSc:  4    Body mass index is 28.29 kg/m.     10/19/2022    2:25 PM 06/16/2022   11:10 AM 02/22/2021    9:22 AM 09/15/2020   11:45 AM 09/15/2020    6:12 AM 09/07/2020   10:27 AM 02/20/2020    9:17 AM  Advanced Directives  Does Patient Have a Medical Advance Directive? Yes Yes Yes No No Yes Yes  Type of Estate agent of [place];Living will  Healthcare Power of [place];Living will Healthcare Power of [place];Living will Healthcare Power of [place];Living will Healthcare Power of [place];Living will Healthcare Power of [place];Living will  Does patient want to make changes to medical advance directive? No - Patient declined      No - Patient declined  Copy of Healthcare Power of Attorney in Chart? No - copy requested  No - copy requested No - copy requested No - copy requested No - copy requested No - copy requested  Would patient like information on creating a  medical advance directive?    No - Patient declined No - Patient declined      Current Medications (verified) Outpatient Encounter Medications as of 10/19/2022  Medication Sig   Cholecalciferol (VITAMIN D3 PO) Take 2,000 Units by mouth daily.   citalopram (CELEXA) 10 MG tablet Take 1 tablet (10 mg total) by mouth daily.   dapagliflozin propanediol (FARXIGA) 10 MG TABS tablet Take 1 tablet (10 mg total) by mouth daily before breakfast.   docusate sodium (COLACE) 100 MG capsule Take 1 capsule (100 mg total) by mouth 2 (two) times daily as needed for mild constipation.   doxycycline (VIBRAMYCIN) 100 MG capsule Take 100 mg by mouth daily. For 2 months/temporary dose   famotidine (PEPCID) 20 MG tablet Take 1 tablet (20 mg total) by mouth daily.   lisinopril-hydrochlorothiazide (ZESTORETIC) 20-12.5 MG tablet Take 1 tablet by mouth daily. In am if BP too low <90<60 cut pill in 1/2   Multiple Vitamin (MULTIVITAMIN) capsule Take 1 capsule by mouth daily.   rosuvastatin (CRESTOR) 5 MG tablet Take 1 tablet (5 mg total) by mouth daily.   traMADol (ULTRAM) 50 MG tablet Take 1 tablet (50 mg total) by mouth every 12 (twelve) hours as needed.   traZODone (DESYREL) 100 MG tablet Take 100 mg by mouth at bedtime as needed.   cyanocobalamin 1000 MCG tablet Take 1,000 mcg by mouth daily. (Patient not taking: Reported on 10/19/2022)   desoximetasone (TOPICORT) 0.25 % cream Apply 1 Application topically 2 (two) times  daily. (Patient not taking: Reported on 10/19/2022)   No facility-administered encounter medications on file as of 10/19/2022.    Allergies (verified) Amlodipine, Nsaids, Ozempic (0.25 or 0.5 mg-dose) [semaglutide(0.25 or 0.5mg -dos)], Benzalkonium chloride, Cetyl alcohol, Codeine, Lanolin, Neomycin, Nickel, and Propylene glycol   History: Past Medical History:  Diagnosis Date   AKI (acute kidney injury) (HCC)    03/2020-04/2020   Allergy    i.e contact allergies [facility]; also  plants, trees    Chicken pox    Depression    Diabetes mellitus without complication (HCC)    DIET CONTROLLED   Family history of breast cancer    BRCA negative in the past   Family history of breast cancer    Family history of lung cancer    Family history of rectal cancer    History of prediabetes    Hypertension    Interstitial cystitis    Interstitial cystitis    Spinal stenosis    UTI (urinary tract infection)    UTI (urinary tract infection)    Past Surgical History:  Procedure Laterality Date   CATARACT EXTRACTION Bilateral    b/l Dr. 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   Procedure: TOTAL HIP ARTHROPLASTY ANTERIOR APPROACH;  Surgeon: Kennedy Bucker, MD;  Location: [facility] ORS;  Service: Orthopedics;  Laterality: Right;   Family History  Problem Relation Age of Onset   Heart attack Mother 21   Breast cancer Mother 67       dx 70, 75   COPD Mother    Heart failure Mother    Leukemia Mother    Stroke Father        age 42    Breast cancer Sister 37   Other Daughter        benign brain tumor   Polycystic ovary syndrome Daughter    Breast cancer Maternal Aunt        dx >50   Lung cancer Maternal Uncle    Breast cancer Maternal Grandmother    Breast cancer Cousin        mat cousins x4   Stroke Other        grandmother age 76    Lupus Other    Social History   Socioeconomic History   Marital status: Widowed    Spouse name: Not on file   Number of children: 2   Years of education: Not on file   Highest education level: Not on file  Occupational History   Not on file  Tobacco Use   Smoking status: Former    Current packs/day: 0.00    Average packs/day: 1 pack/day for 7.0 years (7.0 ttl pk-yrs)    Types: Cigarettes    Start date: 33    Quit date: 46    Years since quitting: 51.7   Smokeless tobacco: Never  Vaping Use   Vaping status: Never Used  Substance and Sexual Activity   Alcohol use: No   Drug use: No   Sexual activity: Not on  file  Other Topics Concern   Not on file  Social History Narrative   1 daughter and 1 son    3 grandsons    Married to high school sweet heart (husband) x 47 years dx'ed with prostate cancer in 11/19/15 he died June 18, 2017    Retired Optometrist education [employer]   Social Determinants of Health   Financial Resource Strain: Low Risk  (10/19/2022)   Overall  Financial Resource Strain (CARDIA)    Difficulty of Paying Living Expenses: Not hard at all  Food Insecurity: No Food Insecurity (10/19/2022)   Hunger Vital Sign    Worried About Running Out of Food in the Last Year: Never true    Ran Out of Food in the Last Year: Never true  Transportation Needs: No Transportation Needs (10/19/2022)   PRAPARE - Administrator, Civil Service (Medical): No    Lack of Transportation (Non-Medical): No  Physical Activity: Sufficiently Active (10/19/2022)   Exercise Vital Sign    Days of Exercise per Week: 3 days    Minutes of Exercise per Session: 60 min  Stress: No Stress Concern Present (10/19/2022)   [employer] of Occupational Health - Occupational Stress Questionnaire    Feeling of Stress : Only a little  Social Connections: Socially Isolated (10/19/2022)   Social Connection and Isolation Panel [NHANES]    Frequency of Communication with Friends and Family: More than three times a week    Frequency of Social Gatherings with Friends and Family: Twice a week    Attends Religious Services: Never    Database administrator or Organizations: No    Attends Banker Meetings: Never    Marital Status: Widowed    Tobacco Counseling Counseling given: Not Answered   Clinical Intake:  Pre-visit preparation completed: Yes  Pain : 0-10 Pain Score: 4  Pain Type: Chronic pain Pain Location: Knee Pain Orientation: Left, Right Pain Descriptors / Indicators: Aching     BMI - recorded: 28.29 Nutritional Status: BMI 25 -29 Overweight Nutritional Risks:  None Diabetes: Yes CBG done?: No Did pt. bring in CBG monitor from home?: No  How often do you need to have someone help you when you read instructions, pamphlets, or other written materials from your doctor or pharmacy?: 1 - Never  Interpreter Needed?: No  Information entered by :: Tora Kindred, CMA   Activities of Daily Living    10/19/2022    2:13 PM  In your present state of health, do you have any difficulty performing the following activities:  Hearing? 0  Vision? 0  Difficulty concentrating or making decisions? 0  Walking or climbing stairs? 1  Comment stairs are difficult due to arthritis  Dressing or bathing? 0  Doing errands, shopping? 0  Preparing Food and eating ? N  Using the Toilet? N  In the past six months, have you accidently leaked urine? N  Do you have problems with loss of bowel control? N  Managing your Medications? N  Managing your Finances? N  Housekeeping or managing your Housekeeping? N    Patient Care Team: Dale Katonah, MD as PCP - General (Internal Medicine) End, Cristal Deer, MD as PCP - Cardiology (Cardiology) Galen Manila, MD as Referring Physician (Ophthalmology)  Indicate any recent Medical Services you may have received from other than [facility] providers in the past year (date may be approximate).     Assessment:   This is a routine wellness examination for [place].  Hearing/Vision screen Hearing Screening - Comments:: Denies hearing loss Vision Screening - Comments:: Gets routine eye exams   Goals Addressed               This Visit's Progress     DIET - EAT MORE FRUITS AND VEGETABLES (pt-stated)        Depression Screen    10/19/2022    2:22 PM 07/28/2022    1:05 PM 03/10/2022   12:56  PM 09/07/2021    2:16 PM 02/23/2021    4:39 PM 02/22/2021    9:21 AM 08/21/2020    1:50 PM  PHQ 2/9 Scores  PHQ - 2 Score 0 0 0 2 2 0 0  PHQ- 9 Score 0 1  3 9       Fall Risk    10/19/2022    2:27 PM 03/10/2022   12:56 PM 09/07/2021     2:15 PM 02/23/2021    1:13 PM 02/22/2021    9:23 AM  Fall Risk   Falls in the past year? 0 0 0 0 0  Number falls in past yr: 0 0 0 0 0  Injury with Fall? 0 0 0 0 0  Risk for fall due to : No Fall Risks No Fall Risks No Fall Risks History of fall(s)   Follow up Falls prevention discussed Falls evaluation completed Falls evaluation completed Falls evaluation completed Falls evaluation completed    MEDICARE RISK AT HOME: Medicare Risk at Home Any stairs in or around the home?: Yes If so, are there any without handrails?: No Home free of loose throw rugs in walkways, pet beds, electrical cords, etc?: Yes Adequate lighting in your home to reduce risk of falls?: Yes Life alert?: No Use of a cane, walker or w/c?: No Grab bars in the bathroom?: Yes Shower chair or bench in shower?: No Elevated toilet seat or a handicapped toilet?: Yes  TIMED UP AND GO:  Was the test performed?  No    Cognitive Function:        10/19/2022    2:28 PM 02/22/2021    9:38 AM 02/19/2019    9:26 AM  6CIT Screen  What Year? 0 points 0 points 0 points  What month? 0 points 0 points 0 points  What time? 0 points 0 points 0 points  Count back from 20 0 points  0 points  Months in reverse 0 points 0 points 0 points  Repeat phrase 0 points  0 points  Total Score 0 points  0 points    Immunizations Immunization History  Administered Date(s) Administered   Fluad Quad(high Dose 65+) 09/25/2019   Hepatitis B, ADULT 02/02/2017, 03/07/2017, 08/03/2017   Influenza Split 11/20/2014   Influenza, High Dose Seasonal PF 09/27/2016, 09/26/2017, 09/27/2018, 11/17/2020, 10/17/2021   Influenza-Unspecified 10/20/2015, 09/27/2016   PFIZER Comirnaty(Gray Top)Covid-19 Tri-Sucrose Vaccine 07/02/2020, 10/17/2021   PFIZER(Purple Top)SARS-COV-2 Vaccination 02/05/2019, 02/26/2019, 10/15/2019   Pfizer Covid-19 Vaccine Bivalent Booster 31yrs & up 11/17/2020   Pneumococcal Conjugate-13 09/13/2017   Pneumococcal Polysaccharide-23  11/20/2014   Tdap 07/22/2015    TDAP status: Up to date  Flu Vaccine status: Due, Education has been provided regarding the importance of this vaccine. Advised may receive this vaccine at local pharmacy or Health Dept. Aware to provide a copy of the vaccination record if obtained from local pharmacy or Health Dept. Verbalized acceptance and understanding.  Pneumococcal vaccine status: Up to date  Covid-19 vaccine status: Information provided on how to obtain vaccines.   Qualifies for Shingles Vaccine? Yes   Zostavax completed No   Shingrix Completed?: No.    Education has been provided regarding the importance of this vaccine. Patient has been advised to call insurance company to determine out of pocket expense if they have not yet received this vaccine. Advised may also receive vaccine at local pharmacy or Health Dept. Verbalized acceptance and understanding.  Screening Tests Health Maintenance  Topic Date Due   Diabetic kidney evaluation -  Urine ACR  05/07/2021   FOOT EXAM  11/26/2021   OPHTHALMOLOGY EXAM  05/20/2022   INFLUENZA VACCINE  08/18/2022   COVID-19 Vaccine (7 - 2023-24 season) 09/18/2022   Zoster Vaccines- Shingrix (1 of 2) 10/28/2022 (Originally 07/10/1969)   HEMOGLOBIN A1C  01/25/2023   Fecal DNA (Cologuard)  02/12/2023   Diabetic kidney evaluation - eGFR measurement  07/25/2023   Medicare Annual Wellness (AWV)  10/19/2023   MAMMOGRAM  11/16/2023   DEXA SCAN  11/16/2023   DTaP/Tdap/Td (2 - Td or Tdap) 07/21/2025   Pneumonia Vaccine 23+ Years old  Completed   Hepatitis C Screening  Completed   HPV VACCINES  Aged Out   Colonoscopy  Discontinued    Health Maintenance  Health Maintenance Due  Topic Date Due   Diabetic kidney evaluation - Urine ACR  05/07/2021   FOOT EXAM  11/26/2021   OPHTHALMOLOGY EXAM  05/20/2022   INFLUENZA VACCINE  08/18/2022   COVID-19 Vaccine (7 - 2023-24 season) 09/18/2022    Colorectal cancer screening: Type of screening: Cologuard.  Completed 02/12/20. Repeat every 3 years  Mammogram status: Completed 11/15/21. Repeat every year. Scheduled 11/18/22  Bone Density status: Completed 11/15/21. Results reflect: Bone density results: OSTEOPENIA. Repeat every 2 years.  Lung Cancer Screening: (Low Dose CT Chest recommended if Age 45-80 years, 20 pack-year currently smoking OR have quit w/in 15years.) does not qualify.   Lung Cancer Screening Referral: n/a  Additional Screening:  Hepatitis C Screening: does not qualify; Completed 12/23/16  Vision Screening: Recommended annual ophthalmology exams for early detection of glaucoma and other disorders of the eye. Is the patient up to date with their annual eye exam?  Yes  Who is the provider or what is the name of the office in which the patient attends annual eye exams? Dr. Galen Manila If pt is not established with a provider, would they like to be referred to a provider to establish care? No .   Dental Screening: Recommended annual dental exams for proper oral hygiene  Diabetic Foot Exam: Diabetic Foot Exam: Overdue, Pt has been advised about the importance in completing this exam. Pt is scheduled for diabetic foot exam on 11/29/22.  Community Resource Referral / Chronic Care Management: CRR required this visit?  No   CCM required this visit?  No     Plan:     I have personally reviewed and noted the following in the patient's chart:   Medical and social history Use of alcohol, tobacco or illicit drugs  Current medications and supplements including opioid prescriptions. Patient is currently taking opioid prescriptions. Information provided to patient regarding non-opioid alternatives. Patient advised to discuss non-opioid treatment plan with their provider. Functional ability and status Nutritional status Physical activity Advanced directives List of other physicians Hospitalizations, surgeries, and ER visits in previous 12 months Vitals Screenings to include  cognitive, depression, and falls Referrals and appointments  In addition, I have reviewed and discussed with patient certain preventive protocols, quality metrics, and best practice recommendations. A written personalized care plan for preventive services as well as general preventive health recommendations were provided to patient.     Tora Kindred, CMA   10/19/2022   After Visit Summary: (MyChart) Due to this being a telephonic visit, the after visit summary with patients personalized plan was offered to patient via MyChart   Nurse Notes:  Patient needs flu shot and shingles vaccines. Last eye exam 05/2022, requested records Placed an order for Cologuard due 01/2023. Patient needs  DM foot exam at next OV on 11/29/22.

## 2022-11-18 ENCOUNTER — Ambulatory Visit
Admission: RE | Admit: 2022-11-18 | Discharge: 2022-11-18 | Disposition: A | Discharge status: Home / Self Care | Payer: Medicare PPO | Source: Ambulatory Visit | Attending: Internal Medicine | Admitting: Internal Medicine

## 2022-11-18 DIAGNOSIS — Z1231 Encounter for screening mammogram for malignant neoplasm of breast: Secondary | ICD-10-CM | POA: Diagnosis not present

## 2022-11-22 ENCOUNTER — Other Ambulatory Visit: Payer: Self-pay | Admitting: Internal Medicine

## 2022-11-22 DIAGNOSIS — R928 Other abnormal and inconclusive findings on diagnostic imaging of breast: Secondary | ICD-10-CM

## 2022-11-23 ENCOUNTER — Telehealth: Payer: Self-pay

## 2022-11-23 NOTE — Telephone Encounter (Signed)
-----   Message from [place] sent at 11/22/2022  9:41 PM EST ----- Notify - I reviewed her mammogram report and radiology is recommending a follow up mammogram and possible ultrasound of left breast.  The order has been placed.  Someone should be contacting her with an appt date and time. Let us know if not scheduled.

## 2022-11-24 ENCOUNTER — Other Ambulatory Visit: Payer: Self-pay

## 2022-11-24 ENCOUNTER — Other Ambulatory Visit (INDEPENDENT_AMBULATORY_CARE_PROVIDER_SITE_OTHER): Payer: Medicare PPO

## 2022-11-24 DIAGNOSIS — E1165 Type 2 diabetes mellitus with hyperglycemia: Secondary | ICD-10-CM

## 2022-11-24 DIAGNOSIS — E785 Hyperlipidemia, unspecified: Secondary | ICD-10-CM | POA: Diagnosis not present

## 2022-11-24 LAB — LIPID PANEL
Cholesterol: 137 mg/dL (ref 0–200)
HDL: 60.5 mg/dL (ref >39.00)
LDL Cholesterol: 55 mg/dL (ref 0–99)
NonHDL: 76.45
Total CHOL/HDL Ratio: 2
Triglycerides: 106 mg/dL (ref 0.0–149.0)
VLDL: 21.2 mg/dL (ref 0.0–40.0)

## 2022-11-24 LAB — BASIC METABOLIC PANEL
BUN: 15 mg/dL (ref 6–23)
CO2: 30 meq/L (ref 19–32)
Calcium: 9.6 mg/dL (ref 8.4–10.5)
Chloride: 97 meq/L (ref 96–112)
Creatinine, Ser: 1.11 mg/dL (ref 0.40–1.20)
GFR: 49.01 mL/min — ABNORMAL LOW (ref >60.00)
Glucose, Bld: 109 mg/dL — ABNORMAL HIGH (ref 70–99)
Potassium: 3.5 meq/L (ref 3.5–5.1)
Sodium: 135 meq/L (ref 135–145)

## 2022-11-24 LAB — HEPATIC FUNCTION PANEL
ALT: 15 U/L (ref 0–35)
AST: 20 U/L (ref 0–37)
Albumin: 4.2 g/dL (ref 3.5–5.2)
Alkaline Phosphatase: 67 U/L (ref 39–117)
Bilirubin, Direct: 0.1 mg/dL (ref 0.0–0.3)
Total Bilirubin: 0.9 mg/dL (ref 0.2–1.2)
Total Protein: 7.3 g/dL (ref 6.0–8.3)

## 2022-11-24 LAB — HEMOGLOBIN A1C: Hgb A1c MFr Bld: 6.5 % (ref 4.6–6.5)

## 2022-11-24 LAB — MICROALBUMIN / CREATININE URINE RATIO
Creatinine,U: 118.5 mg/dL
Microalb Creat Ratio: 1.4 mg/g (ref 0.0–30.0)
Microalb, Ur: 1.6 mg/dL (ref 0.0–1.9)

## 2022-11-29 ENCOUNTER — Ambulatory Visit: Payer: Medicare PPO | Admitting: Internal Medicine

## 2022-11-29 ENCOUNTER — Ambulatory Visit (INDEPENDENT_AMBULATORY_CARE_PROVIDER_SITE_OTHER): Payer: Medicare PPO

## 2022-11-29 ENCOUNTER — Encounter: Payer: Self-pay | Admitting: Internal Medicine

## 2022-11-29 VITALS — BP 122/70 | HR 88 | Temp 98.2°F | Resp 16 | Ht 65.0 in | Wt 175.4 lb

## 2022-11-29 DIAGNOSIS — E785 Hyperlipidemia, unspecified: Secondary | ICD-10-CM

## 2022-11-29 DIAGNOSIS — Z7984 Long term (current) use of oral hypoglycemic drugs: Secondary | ICD-10-CM

## 2022-11-29 DIAGNOSIS — Z23 Encounter for immunization: Secondary | ICD-10-CM | POA: Diagnosis not present

## 2022-11-29 DIAGNOSIS — I5032 Chronic diastolic (congestive) heart failure: Secondary | ICD-10-CM

## 2022-11-29 DIAGNOSIS — F320 Major depressive disorder, single episode, mild: Secondary | ICD-10-CM | POA: Diagnosis not present

## 2022-11-29 DIAGNOSIS — E1165 Type 2 diabetes mellitus with hyperglycemia: Secondary | ICD-10-CM

## 2022-11-29 DIAGNOSIS — M549 Dorsalgia, unspecified: Secondary | ICD-10-CM

## 2022-11-29 DIAGNOSIS — I10 Essential (primary) hypertension: Secondary | ICD-10-CM

## 2022-11-29 DIAGNOSIS — F419 Anxiety disorder, unspecified: Secondary | ICD-10-CM

## 2022-11-29 DIAGNOSIS — D649 Anemia, unspecified: Secondary | ICD-10-CM | POA: Diagnosis not present

## 2022-11-29 DIAGNOSIS — I2510 Atherosclerotic heart disease of native coronary artery without angina pectoris: Secondary | ICD-10-CM | POA: Diagnosis not present

## 2022-11-29 DIAGNOSIS — I700 Atherosclerosis of aorta: Secondary | ICD-10-CM

## 2022-11-29 NOTE — Progress Notes (Signed)
Subjective:    Patient ID: Tabitha Lewis, female    DOB: September 17, 1950, 72 y.o.   MRN: [id number]  Patient here for  Chief Complaint  Patient presents with   Medical Management of Chronic Issues    HPI Here for f/u appt. History of diabetes, hypertension and hypercholesterolemia. Saw Dr End 11/10/21 - CTA. 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  Echo revealed normal LVEF with grade 2 diastolic dysfunction. No significant valvular abnormality was seen.  Started on crestor and farxiga. ABIs - 12/16/21 - normal. Had f/u with Dr End 05/18/22 - started on magnesium - palpitations. Reports from a cardiac standpoint - stable.  Breathing stable. Has noticed mid right back pain/right posterior rib pain.  No known injury or trauma. Has a history of spinal stenosis. No pain with deep breathing. No increased cough or congestion reported.    Past Medical History:  Diagnosis Date   AKI (acute kidney injury) (HCC)    03/2020-04/2020   Allergy    i.e contact allergies [place] [facility]; also plants, trees    Chicken pox    Depression    Diabetes mellitus without complication (HCC)    DIET CONTROLLED   Family history of breast cancer    BRCA negative in the past   Family history of breast cancer    Family history of lung cancer    Family history of rectal cancer    History of prediabetes    Hypertension    Interstitial cystitis    Interstitial cystitis    Spinal stenosis    UTI (urinary tract infection)    UTI (urinary tract infection)    Past Surgical History:  Procedure Laterality Date   CATARACT EXTRACTION Bilateral    b/l Dr. 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   Procedure: TOTAL HIP ARTHROPLASTY ANTERIOR APPROACH;  Surgeon: Kennedy Bucker, MD;  Location: [facility] ORS;  Service: Orthopedics;  Laterality: Right;   Family History  Problem Relation Age of Onset   Heart attack Mother 47   Breast cancer Mother 76       dx 29, 54    COPD Mother    Heart failure Mother    Leukemia Mother    Stroke Father        age 69    Breast cancer Sister 53   Other Daughter        benign brain tumor   Polycystic ovary syndrome Daughter    Breast cancer Maternal Aunt        dx >50   Lung cancer Maternal Uncle    Breast cancer Maternal Grandmother    Breast cancer Cousin        mat cousins x4   Stroke Other        grandmother age 33    Lupus Other    Social History   Socioeconomic History   Marital status: Widowed    Spouse name: Not on file   Number of children: 2   Years of education: Not on file   Highest education level: Not on file  Occupational History   Not on file  Tobacco Use   Smoking status: Former    Current packs/day: 0.00    Average packs/day: 1 pack/day for 7.0 years (7.0 ttl pk-yrs)    Types: Cigarettes    Start date: 8    Quit date: 68    Years since quitting: 51.9   Smokeless tobacco: Never  Vaping  Use   Vaping status: Never Used  Substance and Sexual Activity   Alcohol use: No   Drug use: No   Sexual activity: Not on file  Other Topics Concern   Not on file  Social History Narrative   1 daughter and 1 son    3 grandsons    Married to high school sweet heart (husband) x 47 years dx'ed with prostate cancer in 2015-12-31 he died Jun 29, 2017    Retired Optometrist education [employer]   Social Determinants of Health   Financial Resource Strain: Low Risk  (10/19/2022)   Overall Financial Resource Strain (CARDIA)    Difficulty of Paying Living Expenses: Not hard at all  Food Insecurity: No Food Insecurity (10/19/2022)   Hunger Vital Sign    Worried About Running Out of Food in the Last Year: Never true    Ran Out of Food in the Last Year: Never true  Transportation Needs: No Transportation Needs (10/19/2022)   PRAPARE - Administrator, Civil Service (Medical): No    Lack of Transportation (Non-Medical): No  Physical Activity: Sufficiently Active (10/19/2022)   Exercise  Vital Sign    Days of Exercise per Week: 3 days    Minutes of Exercise per Session: 60 min  Stress: No Stress Concern Present (10/19/2022)   [employer] of Occupational Health - Occupational Stress Questionnaire    Feeling of Stress : Only a little  Social Connections: Socially Isolated (10/19/2022)   Social Connection and Isolation Panel [NHANES]    Frequency of Communication with Friends and Family: More than three times a week    Frequency of Social Gatherings with Friends and Family: Twice a week    Attends Religious Services: Never    Database administrator or Organizations: No    Attends Banker Meetings: Never    Marital Status: Widowed     Review of Systems  Constitutional:  Negative for appetite change and unexpected weight change.  HENT:  Negative for congestion and sinus pressure.   Respiratory:  Negative for cough, chest tightness and shortness of breath.   Cardiovascular:  Negative for chest pain and palpitations.  Gastrointestinal:  Negative for abdominal pain, diarrhea, nausea and vomiting.  Genitourinary:  Negative for difficulty urinating and dysuria.  Musculoskeletal:  Negative for myalgias.       Right mid back / posterior rib pain.   Skin:  Negative for color change and rash.  Neurological:  Negative for dizziness and headaches.  Psychiatric/Behavioral:  Negative for agitation and dysphoric mood.        Objective:     BP 122/70   Pulse 88   Temp 98.2 F (36.8 C)   Resp 16   Ht 5\' 5"  (1.651 m)   Wt 175 lb 6.4 oz (79.6 kg)   SpO2 97%   BMI 29.19 kg/m  Wt Readings from Last 3 Encounters:  11/29/22 175 lb 6.4 oz (79.6 kg)  10/19/22 170 lb (77.1 kg)  07/28/22 172 lb 9.6 oz (78.3 kg)    Physical Exam Vitals reviewed.  Constitutional:      General: She is not in acute distress.    Appearance: Normal appearance.  HENT:     Head: Normocephalic and atraumatic.     Right Ear: External ear normal.     Left Ear: External ear normal.   Eyes:     General: No scleral icterus.       Right eye: No  discharge.        Left eye: No discharge.     Conjunctiva/sclera: Conjunctivae normal.  Neck:     Thyroid: No thyromegaly.  Cardiovascular:     Rate and Rhythm: Normal rate and regular rhythm.  Pulmonary:     Effort: No respiratory distress.     Breath sounds: Normal breath sounds. No wheezing.  Abdominal:     General: Bowel sounds are normal.     Palpations: Abdomen is soft.     Tenderness: There is no abdominal tenderness.  Musculoskeletal:        General: No swelling.     Cervical back: Neck supple. No tenderness.     Comments: Tenderness/pain localized - right mid back/posterior rib.   Lymphadenopathy:     Cervical: No cervical adenopathy.  Skin:    Findings: No erythema or rash.  Neurological:     Mental Status: She is alert.  Psychiatric:        Mood and Affect: Mood normal.        Behavior: Behavior normal.      Outpatient Encounter Medications as of 11/29/2022  Medication Sig   Cholecalciferol (VITAMIN D3 PO) Take 2,000 Units by mouth daily.   citalopram (CELEXA) 10 MG tablet Take 1 tablet (10 mg total) by mouth daily.   dapagliflozin propanediol (FARXIGA) 10 MG TABS tablet Take 1 tablet (10 mg total) by mouth daily before breakfast.   docusate sodium (COLACE) 100 MG capsule Take 1 capsule (100 mg total) by mouth 2 (two) times daily as needed for mild constipation.   doxycycline (VIBRAMYCIN) 100 MG capsule Take 100 mg by mouth daily. For 2 months/temporary dose   famotidine (PEPCID) 20 MG tablet Take 1 tablet (20 mg total) by mouth daily.   lisinopril-hydrochlorothiazide (ZESTORETIC) 20-12.5 MG tablet Take 1 tablet by mouth daily. In am if BP too low <90<60 cut pill in 1/2   Multiple Vitamin (MULTIVITAMIN) capsule Take 1 capsule by mouth daily.   rosuvastatin (CRESTOR) 5 MG tablet Take 1 tablet (5 mg total) by mouth daily.   traMADol (ULTRAM) 50 MG tablet Take 1 tablet (50 mg total) by mouth every 12  (twelve) hours as needed.   traZODone (DESYREL) 100 MG tablet Take 100 mg by mouth at bedtime as needed.   [DISCONTINUED] cyanocobalamin 1000 MCG tablet Take 1,000 mcg by mouth daily. (Patient not taking: Reported on 10/19/2022)   [DISCONTINUED] desoximetasone (TOPICORT) 0.25 % cream Apply 1 Application topically 2 (two) times daily. (Patient not taking: Reported on 10/19/2022)   No facility-administered encounter medications on file as of 11/29/2022.     Lab Results  Component Value Date   WBC 5.5 06/16/2022   HGB 13.3 06/16/2022   HCT 41.0 06/16/2022   PLT 232 06/16/2022   GLUCOSE 109 (H) 11/24/2022   CHOL 137 11/24/2022   TRIG 106.0 11/24/2022   HDL 60.50 11/24/2022   LDLCALC 55 11/24/2022   ALT 15 11/24/2022   AST 20 11/24/2022   NA 135 11/24/2022   K 3.5 11/24/2022   CL 97 11/24/2022   CREATININE 1.11 11/24/2022   BUN 15 11/24/2022   CO2 30 11/24/2022   TSH 3.08 03/10/2022   HGBA1C 6.5 11/24/2022   MICROALBUR 1.6 11/24/2022    MM 3D SCREENING MAMMOGRAM BILATERAL BREAST  Result Date: 11/22/2022 CLINICAL DATA:  Screening. EXAM: DIGITAL SCREENING BILATERAL MAMMOGRAM WITH TOMOSYNTHESIS AND CAD TECHNIQUE: Bilateral screening digital craniocaudal and mediolateral oblique mammograms were obtained. Bilateral screening digital breast tomosynthesis was performed. The  images were evaluated with computer-aided detection. COMPARISON:  Previous exam(s). ACR Breast Density Category b: There are scattered areas of fibroglandular density. FINDINGS: In the left breast, a possible asymmetry warrants further evaluation. In the right breast, no findings suspicious for malignancy. IMPRESSION: Further evaluation is suggested for possible asymmetry in the left breast. RECOMMENDATION: Diagnostic mammogram and possibly ultrasound of the left breast. (Code:[id number]-[id number]) The patient will be contacted regarding the findings, and additional imaging will be scheduled. BI-RADS CATEGORY  0: Incomplete: Need  additional imaging evaluation. Electronically Signed   By: Norva Pavlov M.D.   On: 11/22/2022 15:20       Assessment & Plan:  Need for influenza vaccination -     Flu Vaccine Trivalent High Dose (Fluad)  Controlled type 2 diabetes mellitus with hyperglycemia, without long-term current use of insulin (HCC) Assessment & Plan: On farxiga. Low carb diet and exercise.  Follow met b and A1c.    Orders: -     Hemoglobin A1c; Future  Hyperlipidemia, unspecified hyperlipidemia type -     Lipid panel; Future -     Hepatic function panel; Future  Anemia, unspecified type -     Basic metabolic panel; Future -     CBC with Differential/Platelet; Future -     TSH; Future  Right-sided back pain, unspecified back location, unspecified chronicity Assessment & Plan: Right side.  Appears to be more msk.  Check rib xray and chest xray.   Orders: -     DG Chest 2 View; Future -     DG Ribs Unilateral Right; Future  Hyperlipidemia LDL goal <70 Assessment & Plan: Continue crestor.  Low cholesterol diet and exercise.  Follow lipid panel and liver function tests.   Lab Results  Component Value Date   CHOL 137 11/24/2022   HDL 60.50 11/24/2022   LDLCALC 55 11/24/2022   TRIG 106.0 11/24/2022   CHOLHDL 2 11/24/2022      Primary hypertension Assessment & Plan: Blood pressure as outlined.  Continue lisinopril/hctz.  On farxiga.  Follow metabolic panel.    Depression, major, single episode, mild (HCC) Assessment & Plan: On celexa.  Appears to be doing relatively well.  Follow.    Coronary artery disease involving native coronary artery of native heart without angina pectoris Assessment & Plan: Followed by Dr End.  CTA as outlined. Started Leisure centre manager and Merchant navy officer.  Also recommended magnesium for palpitations.  Follow.    Chronic heart failure with preserved ejection fraction (HFpEF) (HCC) Assessment & Plan: NYHA class II HFpEF. Recent echocardiogram showed LVEF of 55-60% with grade 2  diastolic dysfunction. Followed by Dr End.  Added dapagliflozin 10 mg (12/2021).  No chest pain or sob reported.     Aortic atherosclerosis (HCC) Assessment & Plan: Crestor.    Anxiety Assessment & Plan: Documented history of anxiety.  On celexa.  Appears to be doing well.  Continue.       Dale Hurricane, MD

## 2022-11-30 ENCOUNTER — Ambulatory Visit
Admission: RE | Admit: 2022-11-30 | Discharge: 2022-11-30 | Disposition: A | Discharge status: Home / Self Care | Payer: Medicare PPO | Source: Ambulatory Visit | Attending: Internal Medicine | Admitting: Internal Medicine

## 2022-11-30 DIAGNOSIS — R928 Other abnormal and inconclusive findings on diagnostic imaging of breast: Secondary | ICD-10-CM | POA: Diagnosis not present

## 2022-11-30 DIAGNOSIS — R92333 Mammographic heterogeneous density, bilateral breasts: Secondary | ICD-10-CM | POA: Diagnosis not present

## 2022-12-01 ENCOUNTER — Telehealth: Payer: Self-pay | Admitting: *Deleted

## 2022-12-01 NOTE — Telephone Encounter (Signed)
Left voicemail to return call & sent mychart message

## 2022-12-01 NOTE — Telephone Encounter (Signed)
-----   Message from [place] sent at 11/30/2022  9:02 PM EST ----- Please call and notify Tabitha Lewis that I reviewed her f/u mammogram report and the follow mammogram is ok. Recommended f/u screening mammogram in 11/2023.

## 2022-12-04 ENCOUNTER — Encounter: Payer: Self-pay | Admitting: Internal Medicine

## 2022-12-04 NOTE — Assessment & Plan Note (Signed)
On farxiga. Low carb diet and exercise.  Follow met b and A1c.

## 2022-12-04 NOTE — Assessment & Plan Note (Signed)
Continue crestor.  Low cholesterol diet and exercise.  Follow lipid panel and liver function tests.   Lab Results  Component Value Date   CHOL 137 11/24/2022   HDL 60.50 11/24/2022   LDLCALC 55 11/24/2022   TRIG 106.0 11/24/2022   CHOLHDL 2 11/24/2022

## 2022-12-04 NOTE — Assessment & Plan Note (Signed)
-   Crestor 

## 2022-12-04 NOTE — Assessment & Plan Note (Signed)
Documented history of anxiety.  On celexa.  Appears to be doing well.  Continue.

## 2022-12-04 NOTE — Assessment & Plan Note (Signed)
Followed by Dr End.  CTA as outlined. Started Leisure centre manager and [employer] officer.  Also recommended magnesium for palpitations.  Follow.

## 2022-12-04 NOTE — Assessment & Plan Note (Signed)
On celexa.  Appears to be doing relatively well.  Follow.

## 2022-12-04 NOTE — Assessment & Plan Note (Signed)
Right side.  Appears to be more msk.  Check rib xray and chest xray.

## 2022-12-04 NOTE — Assessment & Plan Note (Signed)
NYHA class II HFpEF. Recent echocardiogram showed LVEF of 55-60% with grade 2 diastolic dysfunction. Followed by Dr End.  Added dapagliflozin 10 mg (12/2021).  No chest pain or sob reported.

## 2022-12-04 NOTE — Assessment & Plan Note (Addendum)
Blood pressure as outlined.  Continue lisinopril/hctz.  On farxiga.  Follow metabolic panel.

## 2022-12-09 ENCOUNTER — Encounter: Payer: Self-pay | Admitting: Internal Medicine

## 2022-12-09 NOTE — Telephone Encounter (Signed)
Care team updated and letter sent for eye exam notes.

## 2022-12-12 ENCOUNTER — Encounter: Payer: Self-pay | Admitting: Internal Medicine

## 2022-12-20 ENCOUNTER — Other Ambulatory Visit: Payer: Self-pay | Admitting: Internal Medicine

## 2022-12-24 ENCOUNTER — Other Ambulatory Visit: Payer: Self-pay | Admitting: Internal Medicine

## 2022-12-26 NOTE — Telephone Encounter (Signed)
last visit: 05/18/22 with plan to f/u in 12 months.  none/active recall

## 2023-02-10 ENCOUNTER — Other Ambulatory Visit: Payer: Self-pay | Admitting: Internal Medicine

## 2023-02-10 NOTE — Telephone Encounter (Signed)
Rx ok'd for trazodone. Is not taking tramadol. Has taken trazodone 100mg  for years and tolerates.  No problems.

## 2023-03-05 DIAGNOSIS — Z1212 Encounter for screening for malignant neoplasm of rectum: Secondary | ICD-10-CM | POA: Diagnosis not present

## 2023-03-05 DIAGNOSIS — Z1211 Encounter for screening for malignant neoplasm of colon: Secondary | ICD-10-CM | POA: Diagnosis not present

## 2023-03-11 LAB — COLOGUARD: COLOGUARD: NEGATIVE

## 2023-03-27 ENCOUNTER — Other Ambulatory Visit (INDEPENDENT_AMBULATORY_CARE_PROVIDER_SITE_OTHER): Payer: Medicare PPO

## 2023-03-27 DIAGNOSIS — E1165 Type 2 diabetes mellitus with hyperglycemia: Secondary | ICD-10-CM | POA: Diagnosis not present

## 2023-03-27 DIAGNOSIS — E785 Hyperlipidemia, unspecified: Secondary | ICD-10-CM

## 2023-03-27 DIAGNOSIS — D649 Anemia, unspecified: Secondary | ICD-10-CM | POA: Diagnosis not present

## 2023-03-27 LAB — CBC WITH DIFFERENTIAL/PLATELET
Basophils Absolute: 0.1 10*3/uL (ref 0.0–0.1)
Basophils Relative: 1 % (ref 0.0–3.0)
Eosinophils Absolute: 0.1 10*3/uL (ref 0.0–0.7)
Eosinophils Relative: 1.7 % (ref 0.0–5.0)
HCT: 39.7 % (ref 36.0–46.0)
Hemoglobin: 13 g/dL (ref 12.0–15.0)
Lymphocytes Relative: 22.5 % (ref 12.0–46.0)
Lymphs Abs: 1.3 10*3/uL (ref 0.7–4.0)
MCHC: 32.7 g/dL (ref 30.0–36.0)
MCV: 86.1 fl (ref 78.0–100.0)
Monocytes Absolute: 0.6 10*3/uL (ref 0.1–1.0)
Monocytes Relative: 10.4 % (ref 3.0–12.0)
Neutro Abs: 3.8 10*3/uL (ref 1.4–7.7)
Neutrophils Relative %: 64.4 % (ref 43.0–77.0)
Platelets: 252 10*3/uL (ref 150.0–400.0)
RBC: 4.61 Mil/uL (ref 3.87–5.11)
RDW: 13.7 % (ref 11.5–15.5)
WBC: 5.8 10*3/uL (ref 4.0–10.5)

## 2023-03-27 LAB — LIPID PANEL
Cholesterol: 136 mg/dL (ref 0–200)
HDL: 66 mg/dL (ref >39.00)
LDL Cholesterol: 52 mg/dL (ref 0–99)
NonHDL: 70.45
Total CHOL/HDL Ratio: 2
Triglycerides: 94 mg/dL (ref 0.0–149.0)
VLDL: 18.8 mg/dL (ref 0.0–40.0)

## 2023-03-27 LAB — BASIC METABOLIC PANEL
BUN: 17 mg/dL (ref 6–23)
CO2: 30 meq/L (ref 19–32)
Calcium: 9.7 mg/dL (ref 8.4–10.5)
Chloride: 100 meq/L (ref 96–112)
Creatinine, Ser: 1.04 mg/dL (ref 0.40–1.20)
GFR: 52.87 mL/min — ABNORMAL LOW (ref >60.00)
Glucose, Bld: 116 mg/dL — ABNORMAL HIGH (ref 70–99)
Potassium: 3.1 meq/L — ABNORMAL LOW (ref 3.5–5.1)
Sodium: 140 meq/L (ref 135–145)

## 2023-03-27 LAB — HEPATIC FUNCTION PANEL
ALT: 16 U/L (ref 0–35)
AST: 17 U/L (ref 0–37)
Albumin: 4.2 g/dL (ref 3.5–5.2)
Alkaline Phosphatase: 62 U/L (ref 39–117)
Bilirubin, Direct: 0.1 mg/dL (ref 0.0–0.3)
Total Bilirubin: 0.7 mg/dL (ref 0.2–1.2)
Total Protein: 7.1 g/dL (ref 6.0–8.3)

## 2023-03-27 LAB — TSH: TSH: 3.08 u[IU]/mL (ref 0.35–5.50)

## 2023-03-27 LAB — HEMOGLOBIN A1C: Hgb A1c MFr Bld: 6.4 % (ref 4.6–6.5)

## 2023-03-28 ENCOUNTER — Other Ambulatory Visit: Payer: Self-pay

## 2023-03-28 MED ORDER — POTASSIUM CHLORIDE CRYS ER 10 MEQ PO TBCR: EXTENDED_RELEASE_TABLET | ORAL | 0 refills | Status: DC

## 2023-03-29 ENCOUNTER — Ambulatory Visit: Payer: Medicare PPO | Admitting: Internal Medicine

## 2023-03-29 VITALS — BP 110/70 | HR 73 | Temp 98.2°F | Resp 16 | Ht 65.0 in | Wt 175.0 lb

## 2023-03-29 DIAGNOSIS — M1712 Unilateral primary osteoarthritis, left knee: Secondary | ICD-10-CM | POA: Diagnosis not present

## 2023-03-29 DIAGNOSIS — I2510 Atherosclerotic heart disease of native coronary artery without angina pectoris: Secondary | ICD-10-CM | POA: Diagnosis not present

## 2023-03-29 DIAGNOSIS — I110 Hypertensive heart disease with heart failure: Secondary | ICD-10-CM | POA: Diagnosis not present

## 2023-03-29 DIAGNOSIS — Z7984 Long term (current) use of oral hypoglycemic drugs: Secondary | ICD-10-CM

## 2023-03-29 DIAGNOSIS — E785 Hyperlipidemia, unspecified: Secondary | ICD-10-CM | POA: Diagnosis not present

## 2023-03-29 DIAGNOSIS — F320 Major depressive disorder, single episode, mild: Secondary | ICD-10-CM | POA: Diagnosis not present

## 2023-03-29 DIAGNOSIS — E1165 Type 2 diabetes mellitus with hyperglycemia: Secondary | ICD-10-CM

## 2023-03-29 DIAGNOSIS — Z0184 Encounter for antibody response examination: Secondary | ICD-10-CM | POA: Diagnosis not present

## 2023-03-29 DIAGNOSIS — I10 Essential (primary) hypertension: Secondary | ICD-10-CM

## 2023-03-29 DIAGNOSIS — E876 Hypokalemia: Secondary | ICD-10-CM | POA: Diagnosis not present

## 2023-03-29 DIAGNOSIS — I5032 Chronic diastolic (congestive) heart failure: Secondary | ICD-10-CM

## 2023-03-29 DIAGNOSIS — I700 Atherosclerosis of aorta: Secondary | ICD-10-CM

## 2023-03-29 NOTE — Progress Notes (Signed)
 Subjective:    Patient ID: Tabitha Lewis, female    DOB: 12-29-1950, 73 y.o.   MRN: [id number]  Patient here for  Chief Complaint  Patient presents with   Medical Management of Chronic Issues    HPI Here for f/u appt. History of diabetes, hypertension and hypercholesterolemia. Saw Dr End 11/10/21 - CTA. 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  Echo revealed normal LVEF with grade 2 diastolic dysfunction. No significant valvular abnormality was seen.  Started on crestor and farxiga. ABIs - 12/16/21 - normal. Had f/u with Dr End 05/18/22 - started on magnesium - palpitations. Continues on celexa. Having increased left knee pain. Persistent. Discussed referral to ortho. Had questions about MMR.    Past Medical History:  Diagnosis Date   AKI (acute kidney injury) (HCC)    03/2020-04/2020   Allergy    i.e contact allergies [facility]ki [facility]; also plants, trees    Chicken pox    Depression    Diabetes mellitus without complication (HCC)    DIET CONTROLLED   Family history of breast cancer    BRCA negative in the past   Family history of breast cancer    Family history of lung cancer    Family history of rectal cancer    History of prediabetes    Hypertension    Interstitial cystitis    Interstitial cystitis    Spinal stenosis    UTI (urinary tract infection)    UTI (urinary tract infection)    Past Surgical History:  Procedure Laterality Date   CATARACT EXTRACTION Bilateral    b/l Dr. 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   Procedure: TOTAL HIP ARTHROPLASTY ANTERIOR APPROACH;  Surgeon: Kennedy Bucker, MD;  Location: [facility] ORS;  Service: Orthopedics;  Laterality: Right;   Family History  Problem Relation Age of Onset   Heart attack Mother 100   Breast cancer Mother 34       dx 50, 38   COPD Mother    Heart failure Mother    Leukemia Mother    Stroke Father        age 10    Breast cancer Sister 58   Other Daughter         benign brain tumor   Polycystic ovary syndrome Daughter    Breast cancer Maternal Aunt        dx >50   Lung cancer Maternal Uncle    Breast cancer Maternal Grandmother    Breast cancer Cousin        mat cousins x4   Stroke Other        grandmother age 67    Lupus Other    Social History   Socioeconomic History   Marital status: Widowed    Spouse name: Not on file   Number of children: 2   Years of education: Not on file   Highest education level: Not on file  Occupational History   Not on file  Tobacco Use   Smoking status: Former    Current packs/day: 0.00    Average packs/day: 1 pack/day for 7.0 years (7.0 ttl pk-yrs)    Types: Cigarettes    Start date: 47    Quit date: 30    Years since quitting: 52.2   Smokeless tobacco: Never  Vaping Use   Vaping status: Never Used  Substance and Sexual Activity   Alcohol use: No   Drug use: No   Sexual  activity: Not on file  Other Topics Concern   Not on file  Social History Narrative   1 daughter and 1 son    3 grandsons    Married to high school sweet heart (husband) x 47 years dx'ed with prostate cancer in 04-15-15 he died Jun 14, 2017    Retired Optometrist education [employer]   Social Drivers of Health   Financial Resource Strain: Low Risk  (10/19/2022)   Overall Financial Resource Strain (CARDIA)    Difficulty of Paying Living Expenses: Not hard at all  Food Insecurity: No Food Insecurity (10/19/2022)   Hunger Vital Sign    Worried About Running Out of Food in the Last Year: Never true    Ran Out of Food in the Last Year: Never true  Transportation Needs: No Transportation Needs (10/19/2022)   PRAPARE - Administrator, Civil Service (Medical): No    Lack of Transportation (Non-Medical): No  Physical Activity: Sufficiently Active (10/19/2022)   Exercise Vital Sign    Days of Exercise per Week: 3 days    Minutes of Exercise per Session: 60 min  Stress: No Stress Concern Present (10/19/2022)    [employer] of Occupational Health - Occupational Stress Questionnaire    Feeling of Stress : Only a little  Social Connections: Socially Isolated (10/19/2022)   Social Connection and Isolation Panel [NHANES]    Frequency of Communication with Friends and Family: More than three times a week    Frequency of Social Gatherings with Friends and Family: Twice a week    Attends Religious Services: Never    Database administrator or Organizations: No    Attends Banker Meetings: Never    Marital Status: Widowed     Review of Systems  Constitutional:  Negative for appetite change and unexpected weight change.  HENT:  Negative for congestion and sinus pressure.   Respiratory:  Negative for cough, chest tightness and shortness of breath.   Cardiovascular:  Negative for chest pain and leg swelling.       Taking magnesium - for palpitations.   Gastrointestinal:  Negative for abdominal pain, diarrhea, nausea and vomiting.  Genitourinary:  Negative for difficulty urinating and dysuria.  Musculoskeletal:  Negative for myalgias.       Left knee pain as outlined.   Skin:  Negative for color change and rash.  Neurological:  Negative for dizziness and headaches.  Psychiatric/Behavioral:  Negative for agitation and dysphoric mood.        Objective:     BP 110/70   Pulse 73   Temp 98.2 F (36.8 C)   Resp 16   Ht 5\' 5"  (1.651 m)   Wt 175 lb (79.4 kg)   SpO2 98%   BMI 29.12 kg/m  Wt Readings from Last 3 Encounters:  03/29/23 175 lb (79.4 kg)  11/29/22 175 lb 6.4 oz (79.6 kg)  10/19/22 170 lb (77.1 kg)    Physical Exam Vitals reviewed.  Constitutional:      General: She is not in acute distress.    Appearance: Normal appearance.  HENT:     Head: Normocephalic and atraumatic.     Right Ear: External ear normal.     Left Ear: External ear normal.     Mouth/Throat:     Pharynx: No oropharyngeal exudate or posterior oropharyngeal erythema.  Eyes:     General: No  scleral icterus.       Right eye: No discharge.  Left eye: No discharge.     Conjunctiva/sclera: Conjunctivae normal.  Neck:     Thyroid: No thyromegaly.  Cardiovascular:     Rate and Rhythm: Normal rate and regular rhythm.  Pulmonary:     Effort: No respiratory distress.     Breath sounds: Normal breath sounds. No wheezing.  Abdominal:     General: Bowel sounds are normal.     Palpations: Abdomen is soft.     Tenderness: There is no abdominal tenderness.  Musculoskeletal:        General: No swelling or tenderness.     Cervical back: Neck supple. No tenderness.  Lymphadenopathy:     Cervical: No cervical adenopathy.  Skin:    Findings: No erythema or rash.  Neurological:     Mental Status: She is alert.  Psychiatric:        Mood and Affect: Mood normal.        Behavior: Behavior normal.         Outpatient Encounter Medications as of 03/29/2023  Medication Sig   Cholecalciferol (VITAMIN D3 PO) Take 2,000 Units by mouth daily.   citalopram (CELEXA) 10 MG tablet Take 1 tablet (10 mg total) by mouth daily.   dapagliflozin propanediol (FARXIGA) 10 MG TABS tablet TAKE 1 TABLET BY MOUTH DAILY BEFORE BREAKFAST.   docusate sodium (COLACE) 100 MG capsule Take 1 capsule (100 mg total) by mouth 2 (two) times daily as needed for mild constipation.   doxycycline (VIBRAMYCIN) 100 MG capsule Take 100 mg by mouth daily. For 2 months/temporary dose   famotidine (PEPCID) 20 MG tablet Take 1 tablet (20 mg total) by mouth daily.   lisinopril-hydrochlorothiazide (ZESTORETIC) 20-12.5 MG tablet Take 1 tablet by mouth daily. In am if BP too low <90<60 cut pill in 1/2   Multiple Vitamin (MULTIVITAMIN) capsule Take 1 capsule by mouth daily.   potassium chloride (KLOR-CON M) 10 MEQ tablet TAKE 1 TABLET BY MOUTH TWICE DAILY FOR 2 DAYS THEN 1 TABLET BY MOUTH DAILY.   rosuvastatin (CRESTOR) 5 MG tablet Take 1 tablet (5 mg total) by mouth daily.   traMADol (ULTRAM) 50 MG tablet Take 1 tablet (50 mg  total) by mouth every 12 (twelve) hours as needed.   traZODone (DESYREL) 100 MG tablet TAKE 1 TABLET BY MOUTH AT BEDTIME AS NEEDED FOR SLEEP.   No facility-administered encounter medications on file as of 03/29/2023.     Lab Results  Component Value Date   WBC 5.8 03/27/2023   HGB 13.0 03/27/2023   HCT 39.7 03/27/2023   PLT 252.0 03/27/2023   GLUCOSE 116 (H) 03/27/2023   CHOL 136 03/27/2023   TRIG 94.0 03/27/2023   HDL 66.00 03/27/2023   LDLCALC 52 03/27/2023   ALT 16 03/27/2023   AST 17 03/27/2023   NA 140 03/27/2023   K 3.1 (L) 03/27/2023   CL 100 03/27/2023   CREATININE 1.04 03/27/2023   BUN 17 03/27/2023   CO2 30 03/27/2023   TSH 3.08 03/27/2023   HGBA1C 6.4 03/27/2023   MICROALBUR 1.6 11/24/2022    MM 3D DIAGNOSTIC MAMMOGRAM UNILATERAL LEFT BREAST Result Date: 11/30/2022 CLINICAL DATA:  Screening recall for possible left breast asymmetry EXAM: DIGITAL DIAGNOSTIC UNILATERAL LEFT MAMMOGRAM WITH TOMOSYNTHESIS AND CAD TECHNIQUE: Left digital diagnostic mammography and breast tomosynthesis was performed. The images were evaluated with computer-aided detection. COMPARISON:  Previous exam(s). ACR Breast Density Category c: The breasts are heterogeneously dense, which may obscure small masses. FINDINGS: The previously described possible asymmetry does not  persist with additional views, consistent with superimposed fibroglandular tissue. No suspicious mass, microcalcification, or other finding is identified. IMPRESSION: The previously described possible asymmetry does not persist with additional views, consistent with superimposed fibroglandular tissue. No mammographic evidence of malignancy in the left breast. RECOMMENDATION: Return to routine screening mammography is recommended. The patient will be due for screening in November 2025. I have discussed the findings and recommendations with the patient. If applicable, a reminder letter will be sent to the patient regarding the next  appointment. BI-RADS CATEGORY  1: Negative. Electronically Signed   By: Jacob Moores M.D.   On: 11/30/2022 16:16       Assessment & Plan:  Type 2 diabetes mellitus with hyperglycemia, without long-term current use of insulin (HCC) Assessment & Plan: Continue farxiga. Low carb diet and exercise. Follow met b and A1c.  Lab Results  Component Value Date   HGBA1C 6.4 03/27/2023      Hypokalemia -     Potassium; Future  Immunity status testing -     Measles/Mumps/Rubella Immunity  Hyperlipidemia LDL goal <70 Assessment & Plan: Continue crestor. Low cholesterol diet and exercise.  Follow lipid panel and liver function tests.   Lab Results  Component Value Date   CHOL 136 03/27/2023   HDL 66.00 03/27/2023   LDLCALC 52 03/27/2023   TRIG 94.0 03/27/2023   CHOLHDL 2 03/27/2023      Primary hypertension Assessment & Plan: Blood pressure as outlined. Continue lisinopril/hydrochlorothiazide. Follow metabolic panel. No change today.    Depression, major, single episode, mild (HCC) Assessment & Plan: Continue celexa. Appears to be doing well. Follow.    Coronary artery disease involving native coronary artery of native heart without angina pectoris Assessment & Plan: Followed by Dr End. CTA as outlined. Continue farxiga and crestor. Follow.  Currently stable.    Chronic heart failure with preserved ejection fraction (HFpEF) (HCC) Assessment & Plan: NYHA class II HFpEF. Recent echocardiogram showed LVEF of 55-60% with grade 2 diastolic dysfunction. Followed by Dr End.  Continue farxiga and lisinopril/hydrochlorothiazide.  No chest pain or sob.    Arthritis of left knee Assessment & Plan: Persistent knee pain. Request referral to ortho.   Orders: -     Ambulatory referral to Orthopedic Surgery  Aortic atherosclerosis [facility]) Assessment & Plan: Continue crestor.       Dale Redmond, MD

## 2023-03-30 LAB — MEASLES/MUMPS/RUBELLA IMMUNITY
Mumps IgG: >300 [AU]/ml
Rubella: 21.8 {index}
Rubeola IgG: >300 [AU]/ml

## 2023-04-01 ENCOUNTER — Encounter: Payer: Self-pay | Admitting: Internal Medicine

## 2023-04-01 NOTE — Assessment & Plan Note (Signed)
 Continue crestor. Low cholesterol diet and exercise.  Follow lipid panel and liver function tests.   Lab Results  Component Value Date   CHOL 136 03/27/2023   HDL 66.00 03/27/2023   LDLCALC 52 03/27/2023   TRIG 94.0 03/27/2023   CHOLHDL 2 03/27/2023

## 2023-04-01 NOTE — Assessment & Plan Note (Signed)
 Persistent knee pain. Request referral to ortho.

## 2023-04-01 NOTE — Assessment & Plan Note (Signed)
 Continue farxiga. Low carb diet and exercise. Follow met b and A1c.  Lab Results  Component Value Date   HGBA1C 6.4 03/27/2023

## 2023-04-01 NOTE — Assessment & Plan Note (Signed)
 Followed by Dr End. CTA as outlined. Continue farxiga and crestor. Follow.  Currently stable.

## 2023-04-01 NOTE — Assessment & Plan Note (Signed)
 Continue celexa. Appears to be doing well. Follow.

## 2023-04-01 NOTE — Assessment & Plan Note (Signed)
 Blood pressure as outlined. Continue lisinopril/hydrochlorothiazide. Follow metabolic panel. No change today.

## 2023-04-01 NOTE — Assessment & Plan Note (Signed)
 Continue crestor

## 2023-04-01 NOTE — Assessment & Plan Note (Signed)
 NYHA class II HFpEF. Recent echocardiogram showed LVEF of 55-60% with grade 2 diastolic dysfunction. Followed by Dr End.  Continue farxiga and lisinopril/hydrochlorothiazide.  No chest pain or sob.

## 2023-04-10 ENCOUNTER — Other Ambulatory Visit (INDEPENDENT_AMBULATORY_CARE_PROVIDER_SITE_OTHER)

## 2023-04-10 DIAGNOSIS — E876 Hypokalemia: Secondary | ICD-10-CM

## 2023-04-11 ENCOUNTER — Telehealth: Payer: Self-pay | Admitting: Internal Medicine

## 2023-04-11 ENCOUNTER — Other Ambulatory Visit: Payer: Self-pay | Admitting: Internal Medicine

## 2023-04-11 LAB — POTASSIUM: Potassium: 4 meq/L (ref 3.5–5.1)

## 2023-04-11 NOTE — Telephone Encounter (Signed)
*  STAT* If patient is at the pharmacy, call can be transferred to refill team.   1. Which medications need to be refilled? (please list name of each medication and dose if known)   dapagliflozin propanediol (FARXIGA) 10 MG TABS tablet    2. Which pharmacy/location (including street and city if local pharmacy) is medication to be sent to?  [id number] - [place], [place] - [address]      3. Do they need a 30 day or 90 day supply? 90 day

## 2023-04-12 ENCOUNTER — Other Ambulatory Visit: Payer: Self-pay

## 2023-04-12 DIAGNOSIS — E876 Hypokalemia: Secondary | ICD-10-CM

## 2023-04-19 ENCOUNTER — Other Ambulatory Visit: Payer: Self-pay | Admitting: Internal Medicine

## 2023-04-20 NOTE — Telephone Encounter (Signed)
 Rx sent in for potassium daily #90 with one refills.

## 2023-05-03 ENCOUNTER — Other Ambulatory Visit

## 2023-05-11 ENCOUNTER — Other Ambulatory Visit: Payer: Self-pay | Admitting: Internal Medicine

## 2023-05-17 DIAGNOSIS — M1712 Unilateral primary osteoarthritis, left knee: Secondary | ICD-10-CM | POA: Diagnosis not present

## 2023-05-26 DIAGNOSIS — Z961 Presence of intraocular lens: Secondary | ICD-10-CM | POA: Diagnosis not present

## 2023-05-26 DIAGNOSIS — H43813 Vitreous degeneration, bilateral: Secondary | ICD-10-CM | POA: Diagnosis not present

## 2023-05-26 LAB — HM DIABETES EYE EXAM

## 2023-06-16 ENCOUNTER — Telehealth: Payer: Self-pay | Admitting: Internal Medicine

## 2023-06-16 NOTE — Telephone Encounter (Signed)
 Dr Geralyn Knee will not be in the office the day of your scheduled visit 08/04/23 . Please give the office a call at [phone] and we will get you rescheduled.  Thank you  LMOM and sent above message via MyChart. E2C2 please reschedule this pt when they call back -[facility]

## 2023-07-12 ENCOUNTER — Ambulatory Visit: Attending: Medical | Admitting: Medical

## 2023-07-12 ENCOUNTER — Ambulatory Visit: Admitting: Internal Medicine

## 2023-07-12 ENCOUNTER — Encounter: Payer: Self-pay | Admitting: Medical

## 2023-07-12 VITALS — BP 110/58 | HR 76 | Ht 65.0 in | Wt 174.4 lb

## 2023-07-12 DIAGNOSIS — I493 Ventricular premature depolarization: Secondary | ICD-10-CM | POA: Diagnosis not present

## 2023-07-12 DIAGNOSIS — I10 Essential (primary) hypertension: Secondary | ICD-10-CM | POA: Diagnosis not present

## 2023-07-12 DIAGNOSIS — I5032 Chronic diastolic (congestive) heart failure: Secondary | ICD-10-CM

## 2023-07-12 DIAGNOSIS — I2510 Atherosclerotic heart disease of native coronary artery without angina pectoris: Secondary | ICD-10-CM | POA: Diagnosis not present

## 2023-07-12 DIAGNOSIS — E782 Mixed hyperlipidemia: Secondary | ICD-10-CM

## 2023-07-12 NOTE — Progress Notes (Signed)
 Cardiology Office Note   Date:  07/12/2023  ID:  Tabitha Lewis, Tabitha Lewis [birth year], MRN [id number] PCP: Glendia Shad, MD  [facility] Providers Cardiologist:  Lonni Hanson, MD    History of Present Illness Tabitha Lewis is a 73 y.o. female with a history of chronic HFpEF, hypertension, diet-controlled type 2 diabetes, HLD, and nonobstructive CAD who presents for 1 year follow-up.  Coronary CTA November 2023 showed normal coronary arteries with 25% proximal LAD stenosis, no significant disease in the left circumflex or RCA.  Coronary calcium  score 129.  Echo in 2023 showed EF of 55 to 60%, grade 2 diastolic dysfunction.  Farxiga  was added for shortness of breath.  Patient was last seen 05/18/2022 and was overall doing well from a cardiac perspective.  Today, the patient reports occasional lightheadedness and dizziness when she stands up. BP today is normal. Symptoms are overall tolerable. She stays hydrated and eats well. She denies chest pain, SOB. She stays active around the house.   Studies Reviewed EKG Interpretation Date/Time:  Wednesday July 12 2023 14:35:58 EDT Ventricular Rate:  76 PR Interval:  162 QRS Duration:  84 QT Interval:  400 QTC Calculation: 450 R Axis:   32  Text Interpretation: Sinus rhythm with occasional Premature ventricular complexes Septal infarct , age undetermined When compared with ECG of 16-Jun-2022 11:11, Premature ventricular complexes are now Present Septal infarct is now Present Nonspecific T wave abnormality now evident in Inferior leads Nonspecific T wave abnormality now evident in Lateral leads Confirmed by Franchester, Oneka Parada ([id number]) on 07/12/2023 2:58:51 PM    Echo 11/2021  1. Left ventricular ejection fraction, by estimation, is 55 to 60%. The  left ventricle has normal function. The left ventricle has no regional  wall motion abnormalities. Left ventricular diastolic parameters are  consistent with Grade II diastolic   dysfunction (pseudonormalization).   2. Right ventricular systolic function is normal. The right ventricular  size is normal. There is normal pulmonary artery systolic pressure. The  estimated right ventricular systolic pressure is 20.2 mmHg.   3. The mitral valve is normal in structure. No evidence of mitral valve  regurgitation. No evidence of mitral stenosis.   4. The aortic valve is tricuspid. Aortic valve regurgitation is not  visualized. Aortic valve sclerosis is present, with no evidence of aortic  valve stenosis.   5. The inferior vena cava is normal in size with greater than 50%  respiratory variability, suggesting right atrial pressure of 3 mmHg.   Cardiac CTA 11/2021  IMPRESSION: 1. Coronary calcium  score of 129. This was 69th percentile for age and sex matched control.   2. Normal coronary origin with right dominance.   3. Mild proximal LAD stenosis (25%).   4. CAD-RADS 2. Mild non-obstructive CAD (25-49%). Consider non-atherosclerotic causes of chest pain. Consider preventive therapy and risk factor modification.      Physical Exam VS:  BP (!) 110/58   Pulse 76   Ht 5' 5 (1.651 m)   Wt 174 lb 6.4 oz (79.1 kg)   SpO2 96%   BMI 29.02 kg/m        Wt Readings from Last 3 Encounters:  07/12/23 174 lb 6.4 oz (79.1 kg)  03/29/23 175 lb (79.4 kg)  11/29/22 175 lb 6.4 oz (79.6 kg)    GEN: Well nourished, well developed in no acute distress NECK: No JVD; No carotid bruits CARDIAC: RRR, + murmur, no rubs, gallops RESPIRATORY:  Clear to auscultation without rales, wheezing or rhonchi  ABDOMEN: Soft, non-tender, non-distended EXTREMITIES:  No edema; No deformity   ASSESSMENT AND PLAN  Chronic HFpEF The patient is euvolemic on exam. Echo in 2023 showed LVEF 55-60%, G2DD, AV sclerosis. Continue low dose hydrochlorothiazide  for volume management.  PVCs One PVC on EKG today. She denies palpitations. Keep Mag>2 and K>4.  HTN Bp is normal. Patient reports  occasional orthostasis. Can decrease lisinopril  in the future if needed. Continue lisinopril -hydrochlorothiazide  20-12.5mg  daily for now.   Nonobstructive CAD Patient denies anginal symptoms. She stays active around the house. Diet is good. Continue statin therapy.   HLD LDL 52. Continue Crestor  5mg  daily.         Dispo: Follow-up in 1 year  Signed, Teddy Pena VEAR Fishman, PA-C

## 2023-07-12 NOTE — Patient Instructions (Signed)
 Medication Instructions:  Your physician recommends that you continue on your current medications as directed. Please refer to the Current Medication list given to you today.  *If you need a refill on your cardiac medications before your next appointment, please call your pharmacy*  Lab Work: No labs ordered today  If you have labs (blood work) drawn today and your tests are completely normal, you will receive your results only by: MyChart Message (if you have MyChart) OR A paper copy in the mail If you have any lab test that is abnormal or we need to change your treatment, we will call you to review the results.  Testing/Procedures: No test ordered today   Follow-Up: At [facility], you and your health needs are our priority.  As part of our continuing mission to provide you with exceptional heart care, our providers are all part of one team.  This team includes your primary Cardiologist (physician) and Advanced Practice Providers or APPs (Physician Assistants and Nurse Practitioners) who all work together to provide you with the care you need, when you need it.  Your next appointment:   12 month(s)  Provider:   You may see Lonni Hanson, MD or one of the following Advanced Practice Providers on your designated Care Team:    Cadence [place], PA-C

## 2023-07-19 DIAGNOSIS — M76892 Other specified enthesopathies of left lower limb, excluding foot: Secondary | ICD-10-CM | POA: Diagnosis not present

## 2023-07-19 DIAGNOSIS — M1712 Unilateral primary osteoarthritis, left knee: Secondary | ICD-10-CM | POA: Diagnosis not present

## 2023-07-19 DIAGNOSIS — M1612 Unilateral primary osteoarthritis, left hip: Secondary | ICD-10-CM | POA: Diagnosis not present

## 2023-07-26 ENCOUNTER — Other Ambulatory Visit: Payer: Self-pay | Admitting: Internal Medicine

## 2023-08-01 ENCOUNTER — Other Ambulatory Visit

## 2023-08-02 ENCOUNTER — Telehealth: Payer: Self-pay | Admitting: Internal Medicine

## 2023-08-02 DIAGNOSIS — E559 Vitamin D deficiency, unspecified: Secondary | ICD-10-CM

## 2023-08-02 DIAGNOSIS — E785 Hyperlipidemia, unspecified: Secondary | ICD-10-CM

## 2023-08-02 DIAGNOSIS — I10 Essential (primary) hypertension: Secondary | ICD-10-CM

## 2023-08-02 DIAGNOSIS — E1165 Type 2 diabetes mellitus with hyperglycemia: Secondary | ICD-10-CM

## 2023-08-02 NOTE — Telephone Encounter (Signed)
Need lab order

## 2023-08-03 NOTE — Telephone Encounter (Signed)
 Orders placed for labs

## 2023-08-04 ENCOUNTER — Encounter: Admitting: Internal Medicine

## 2023-08-10 ENCOUNTER — Other Ambulatory Visit: Payer: Self-pay | Admitting: Internal Medicine

## 2023-08-11 ENCOUNTER — Other Ambulatory Visit

## 2023-08-11 DIAGNOSIS — E559 Vitamin D deficiency, unspecified: Secondary | ICD-10-CM | POA: Diagnosis not present

## 2023-08-11 DIAGNOSIS — E785 Hyperlipidemia, unspecified: Secondary | ICD-10-CM

## 2023-08-11 DIAGNOSIS — E1165 Type 2 diabetes mellitus with hyperglycemia: Secondary | ICD-10-CM | POA: Diagnosis not present

## 2023-08-11 LAB — HEMOGLOBIN A1C: Hgb A1c MFr Bld: 6.4 % (ref 4.6–6.5)

## 2023-08-11 LAB — LIPID PANEL
Cholesterol: 139 mg/dL (ref 0–200)
HDL: 72.5 mg/dL (ref >39.00)
LDL Cholesterol: 53 mg/dL (ref 0–99)
NonHDL: 66.07
Total CHOL/HDL Ratio: 2
Triglycerides: 63 mg/dL (ref 0.0–149.0)
VLDL: 12.6 mg/dL (ref 0.0–40.0)

## 2023-08-11 LAB — BASIC METABOLIC PANEL WITH GFR
BUN: 22 mg/dL (ref 6–23)
CO2: 28 meq/L (ref 19–32)
Calcium: 9.3 mg/dL (ref 8.4–10.5)
Chloride: 104 meq/L (ref 96–112)
Creatinine, Ser: 1.33 mg/dL — ABNORMAL HIGH (ref 0.40–1.20)
GFR: 39.25 mL/min — ABNORMAL LOW (ref >60.00)
Glucose, Bld: 112 mg/dL — ABNORMAL HIGH (ref 70–99)
Potassium: 4.3 meq/L (ref 3.5–5.1)
Sodium: 139 meq/L (ref 135–145)

## 2023-08-11 LAB — MICROALBUMIN / CREATININE URINE RATIO
Creatinine,U: 193.5 mg/dL
Microalb Creat Ratio: 20.4 mg/g (ref 0.0–30.0)
Microalb, Ur: 4 mg/dL — ABNORMAL HIGH (ref 0.0–1.9)

## 2023-08-11 LAB — HEPATIC FUNCTION PANEL
ALT: 17 U/L (ref 0–35)
AST: 15 U/L (ref 0–37)
Albumin: 4.3 g/dL (ref 3.5–5.2)
Alkaline Phosphatase: 65 U/L (ref 39–117)
Bilirubin, Direct: 0.1 mg/dL (ref 0.0–0.3)
Total Bilirubin: 0.7 mg/dL (ref 0.2–1.2)
Total Protein: 6.7 g/dL (ref 6.0–8.3)

## 2023-08-11 LAB — VITAMIN D 25 HYDROXY (VIT D DEFICIENCY, FRACTURES): VITD: 40.59 ng/mL (ref 30.00–100.00)

## 2023-08-12 ENCOUNTER — Ambulatory Visit: Payer: Self-pay | Admitting: Internal Medicine

## 2023-08-13 NOTE — Telephone Encounter (Signed)
 Will refill at 7/29 OV

## 2023-08-15 ENCOUNTER — Ambulatory Visit: Admitting: Internal Medicine

## 2023-08-15 VITALS — BP 120/70 | HR 81 | Resp 16 | Ht 65.0 in | Wt 173.4 lb

## 2023-08-15 DIAGNOSIS — F320 Major depressive disorder, single episode, mild: Secondary | ICD-10-CM | POA: Diagnosis not present

## 2023-08-15 DIAGNOSIS — R944 Abnormal results of kidney function studies: Secondary | ICD-10-CM | POA: Diagnosis not present

## 2023-08-15 DIAGNOSIS — E1165 Type 2 diabetes mellitus with hyperglycemia: Secondary | ICD-10-CM | POA: Diagnosis not present

## 2023-08-15 DIAGNOSIS — I10 Essential (primary) hypertension: Secondary | ICD-10-CM

## 2023-08-15 DIAGNOSIS — Z0000 Encounter for general adult medical examination without abnormal findings: Secondary | ICD-10-CM | POA: Diagnosis not present

## 2023-08-15 DIAGNOSIS — I2510 Atherosclerotic heart disease of native coronary artery without angina pectoris: Secondary | ICD-10-CM

## 2023-08-15 DIAGNOSIS — Z7984 Long term (current) use of oral hypoglycemic drugs: Secondary | ICD-10-CM

## 2023-08-15 DIAGNOSIS — I700 Atherosclerosis of aorta: Secondary | ICD-10-CM

## 2023-08-15 DIAGNOSIS — E559 Vitamin D deficiency, unspecified: Secondary | ICD-10-CM

## 2023-08-15 DIAGNOSIS — E785 Hyperlipidemia, unspecified: Secondary | ICD-10-CM | POA: Diagnosis not present

## 2023-08-15 DIAGNOSIS — I5032 Chronic diastolic (congestive) heart failure: Secondary | ICD-10-CM

## 2023-08-15 MED ORDER — POTASSIUM CHLORIDE CRYS ER 10 MEQ PO TBCR
10.0000 meq | EXTENDED_RELEASE_TABLET | Freq: Every day | ORAL | 1 refills | Status: DC
Start: 2023-08-15 — End: 2023-12-15

## 2023-08-15 NOTE — Progress Notes (Addendum)
 Subjective:    Patient ID: Tabitha Lewis, female    DOB: Nov 02, 1950, 73 y.o.   MRN: [id number]  Patient here for  Chief Complaint  Patient presents with   Annual Exam    HPI Here for a physical exam. S/p left intra-articular hip joint steroid injection 07/19/23. Had f/u with cardiology 07/12/23 - f/u chronic HFpEF. Continue low dose hydrochlorothiazide . (Lisinopril /hydrochlorothiazide ). Continues on citalopram . Overall she feels she is doing relatively well. No chest pain reported. Breathing stable. Discussed labs.  Decreased GFR. Discussed staying hydrated.    Past Medical History:  Diagnosis Date   AKI (acute kidney injury) (HCC)    03/2020-04/2020   Allergy    i.e contact allergies  dermatology Douglass Mulligan; also plants, trees    Chicken pox    Depression    Diabetes mellitus without complication (HCC)    DIET CONTROLLED   Family history of breast cancer    BRCA negative in the past   Family history of breast cancer    Family history of lung cancer    Family history of rectal cancer    History of prediabetes    Hypertension    Interstitial cystitis    Interstitial cystitis    Spinal stenosis    UTI (urinary tract infection)    UTI (urinary tract infection)    Past Surgical History:  Procedure Laterality Date   CATARACT EXTRACTION Bilateral    b/l Dr. 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   Procedure: TOTAL HIP ARTHROPLASTY ANTERIOR APPROACH;  Surgeon: Kathlynn Sharper, MD;  Location: [facility] ORS;  Service: Orthopedics;  Laterality: Right;   Family History  Problem Relation Age of Onset   Heart attack Mother 79   Breast cancer Mother 57       dx 45, 110   COPD Mother    Heart failure Mother    Leukemia Mother    Stroke Father        age 56    Breast cancer Sister 65   Other Daughter        benign brain tumor   Polycystic ovary syndrome Daughter    Breast cancer Maternal Aunt        dx >50   Lung cancer Maternal Uncle    Breast cancer Maternal  Grandmother    Breast cancer Cousin        mat cousins x4   Stroke Other        grandmother age 45    Lupus Other    Social History   Socioeconomic History   Marital status: Widowed    Spouse name: Not on file   Number of children: 2   Years of education: Not on file   Highest education level: Not on file  Occupational History   Not on file  Tobacco Use   Smoking status: Former    Current packs/day: 0.00    Average packs/day: 1 pack/day for 7.0 years (7.0 ttl pk-yrs)    Types: Cigarettes    Start date: 63    Quit date: 34    Years since quitting: 52.6   Smokeless tobacco: Never  Vaping Use   Vaping status: Never Used  Substance and Sexual Activity   Alcohol use: No   Drug use: No   Sexual activity: Not on file  Other Topics Concern   Not on file  Social History Narrative   1 daughter and 1 son    3 grandsons    Married  to high school sweet heart (husband) x 47 years dx'ed with prostate cancer in 09/26/2015 he died 06-25-17    Retired Optometrist education [employer]   Social Drivers of Health   Financial Resource Strain: Low Risk  (07/19/2023)   Received from [facility]   Overall Financial Resource Strain (CARDIA)    Difficulty of Paying Living Expenses: Not very hard  Food Insecurity: No Food Insecurity (07/19/2023)   Received from [facility]   Hunger Vital Sign    Within the past 12 months, you worried that your food would run out before you got the money to buy more.: Never true    Within the past 12 months, the food you bought just didn't last and you didn't have money to get more.: Never true  Transportation Needs: No Transportation Needs (07/19/2023)   Received from [facility] - Transportation    In the past 12 months, has lack of transportation kept you from medical appointments or from getting medications?: No    Lack of Transportation (Non-Medical): No  Physical Activity:  Sufficiently Active (10/19/2022)   Exercise Vital Sign    Days of Exercise per Week: 3 days    Minutes of Exercise per Session: 60 min  Stress: No Stress Concern Present (10/19/2022)   [employer] of Occupational Health - Occupational Stress Questionnaire    Feeling of Stress : Only a little  Social Connections: Socially Isolated (10/19/2022)   Social Connection and Isolation Panel    Frequency of Communication with Friends and Family: More than three times a week    Frequency of Social Gatherings with Friends and Family: Twice a week    Attends Religious Services: Never    Database administrator or Organizations: No    Attends Banker Meetings: Never    Marital Status: Widowed     Review of Systems  Constitutional:  Negative for appetite change and unexpected weight change.  HENT:  Negative for congestion, sinus pressure and sore throat.   Eyes:  Negative for pain and visual disturbance.  Respiratory:  Negative for cough, chest tightness and shortness of breath.   Cardiovascular:  Negative for chest pain, palpitations and leg swelling.  Gastrointestinal:  Negative for abdominal pain, diarrhea, nausea and vomiting.  Genitourinary:  Negative for difficulty urinating and dysuria.  Musculoskeletal:  Negative for joint swelling and myalgias.  Skin:  Negative for color change and rash.  Neurological:  Negative for dizziness and headaches.  Hematological:  Negative for adenopathy. Does not bruise/bleed easily.  Psychiatric/Behavioral:  Negative for agitation and dysphoric mood.        Objective:     BP 120/70   Pulse 81   Resp 16   Ht 5' 5 (1.651 m)   Wt 173 lb 6.4 oz (78.7 kg)   SpO2 98%   BMI 28.86 kg/m  Wt Readings from Last 3 Encounters:  08/15/23 173 lb 6.4 oz (78.7 kg)  07/12/23 174 lb 6.4 oz (79.1 kg)  03/29/23 175 lb (79.4 kg)    Physical Exam Vitals reviewed.  Constitutional:      General: She is not in acute distress.    Appearance: Normal  appearance. She is well-developed.  HENT:     Head: Normocephalic and atraumatic.     Right Ear: External ear normal.     Left Ear: External ear normal.     Mouth/Throat:     Pharynx:  No oropharyngeal exudate or posterior oropharyngeal erythema.  Eyes:     General: No scleral icterus.       Right eye: No discharge.        Left eye: No discharge.     Conjunctiva/sclera: Conjunctivae normal.  Neck:     Thyroid : No thyromegaly.  Cardiovascular:     Rate and Rhythm: Normal rate and regular rhythm.  Pulmonary:     Effort: No tachypnea, accessory muscle usage or respiratory distress.     Breath sounds: Normal breath sounds. No decreased breath sounds or wheezing.  Chest:  Breasts:    Right: No inverted nipple, mass, nipple discharge or tenderness (no axillary adenopathy).     Left: No inverted nipple, mass, nipple discharge or tenderness (no axilarry adenopathy).  Abdominal:     General: Bowel sounds are normal.     Palpations: Abdomen is soft.     Tenderness: There is no abdominal tenderness.  Musculoskeletal:        General: No swelling or tenderness.     Cervical back: Neck supple.  Lymphadenopathy:     Cervical: No cervical adenopathy.  Skin:    Findings: No erythema or rash.  Neurological:     Mental Status: She is alert and oriented to person, place, and time.  Psychiatric:        Mood and Affect: Mood normal.        Behavior: Behavior normal.         Outpatient Encounter Medications as of 08/15/2023  Medication Sig   Cholecalciferol (VITAMIN D3 PO) Take 2,000 Units by mouth daily.   citalopram  (CELEXA ) 10 MG tablet TAKE 1 TABLET BY MOUTH EVERY DAY   dapagliflozin  propanediol (FARXIGA ) 10 MG TABS tablet TAKE 1 TABLET BY MOUTH DAILY BEFORE BREAKFAST.   docusate sodium  (COLACE) 100 MG capsule Take 1 capsule (100 mg total) by mouth 2 (two) times daily as needed for mild constipation.   famotidine  (PEPCID ) 20 MG tablet Take 1 tablet (20 mg total) by mouth daily. (Patient  not taking: Reported on 07/12/2023)   Multiple Vitamin (MULTIVITAMIN) capsule Take 1 capsule by mouth daily.   potassium chloride  (KLOR-CON  M) 10 MEQ tablet Take 1 tablet (10 mEq total) by mouth daily.   rosuvastatin  (CRESTOR ) 5 MG tablet Take 1 tablet (5 mg total) by mouth daily.   traMADol  (ULTRAM ) 50 MG tablet Take 1 tablet (50 mg total) by mouth every 12 (twelve) hours as needed.   traZODone  (DESYREL ) 100 MG tablet TAKE 1 TABLET BY MOUTH AT BEDTIME AS NEEDED FOR SLEEP.   [DISCONTINUED] doxycycline  (VIBRAMYCIN ) 100 MG capsule Take 100 mg by mouth daily. For 2 months/temporary dose   [DISCONTINUED] lisinopril -hydrochlorothiazide  (ZESTORETIC ) 20-12.5 MG tablet TAKE 1 TABLET BY MOUTH DAILY. IN AM IF BP TOO LOW <90<60 CUT PILL IN 1/2   [DISCONTINUED] potassium chloride  (KLOR-CON  M) 10 MEQ tablet Take 1 tablet (10 mEq total) by mouth daily.   No facility-administered encounter medications on file as of 08/15/2023.     Lab Results  Component Value Date   WBC 5.8 03/27/2023   HGB 13.0 03/27/2023   HCT 39.7 03/27/2023   PLT 252.0 03/27/2023   GLUCOSE 92 08/15/2023   CHOL 139 08/11/2023   TRIG 63.0 08/11/2023   HDL 72.50 08/11/2023   LDLCALC 53 08/11/2023   ALT 17 08/11/2023   AST 15 08/11/2023   NA 137 08/15/2023   K 4.4 08/15/2023   CL 100 08/15/2023   CREATININE 1.32 (H) 08/15/2023  BUN 29 (H) 08/15/2023   CO2 28 08/15/2023   TSH 3.08 03/27/2023   HGBA1C 6.4 08/11/2023   MICROALBUR 4.0 (H) 08/11/2023    MM 3D DIAGNOSTIC MAMMOGRAM UNILATERAL LEFT BREAST Result Date: 11/30/2022 CLINICAL DATA:  Screening recall for possible left breast asymmetry EXAM: DIGITAL DIAGNOSTIC UNILATERAL LEFT MAMMOGRAM WITH TOMOSYNTHESIS AND CAD TECHNIQUE: Left digital diagnostic mammography and breast tomosynthesis was performed. The images were evaluated with computer-aided detection. COMPARISON:  Previous exam(s). ACR Breast Density Category c: The breasts are heterogeneously dense, which may obscure  small masses. FINDINGS: The previously described possible asymmetry does not persist with additional views, consistent with superimposed fibroglandular tissue. No suspicious mass, microcalcification, or other finding is identified. IMPRESSION: The previously described possible asymmetry does not persist with additional views, consistent with superimposed fibroglandular tissue. No mammographic evidence of malignancy in the left breast. RECOMMENDATION: Return to routine screening mammography is recommended. The patient will be due for screening in November 2025. I have discussed the findings and recommendations with the patient. If applicable, a reminder letter will be sent to the patient regarding the next appointment. BI-RADS CATEGORY  1: Negative. Electronically Signed   By: Dirk Arrant M.D.   On: 11/30/2022 16:16       Assessment & Plan:  Routine general medical examination at a health care facility  Healthcare maintenance Assessment & Plan: Physical today 08/15/23.  Mammogram 11/18/22 - Birads 0.  F/u left breast mammogram - Birads I. Cologuard 01/2020 - negative. Cologuard 02/2023 - negative.    Decreased GFR -     Basic metabolic panel with GFR  Type 2 diabetes mellitus with hyperglycemia, without long-term current use of insulin (HCC) Assessment & Plan: Continue farxiga . Low carb diet and exercise. Follow met b and A1c.  Discussed recent labs. Stay hydrated.  Lab Results  Component Value Date   HGBA1C 6.4 08/11/2023     Orders: -     Basic metabolic panel with GFR; Future -     Hemoglobin A1c; Future  Hyperlipidemia LDL goal <70 Assessment & Plan: Continue crestor . Low cholesterol diet and exercise.  Follow lipid panel and liver function tests.  No changes in medication today.  Lab Results  Component Value Date   CHOL 139 08/11/2023   HDL 72.50 08/11/2023   LDLCALC 53 08/11/2023   TRIG 63.0 08/11/2023   CHOLHDL 2 08/11/2023     Orders: -     Lipid panel; Future -      Hepatic function panel; Future  Vitamin D  deficiency Assessment & Plan: Vitamin D  08/11/23 - wnl.    Primary hypertension Assessment & Plan: Blood pressure as outlined. Continue lisinopril /hydrochlorothiazide . Discussed recent labs and decreased GFR. Discussed staying hydrated. Will recheck met b today. If persistent decrease, discussed changing lisinopril /hydrochlorothiazide  to lisinopril  20mg  q day. Will need to stop potassium if makes this change. Follow pressures.    Depression, major, single episode, mild (HCC) Assessment & Plan: Continue citalopram . Follow.    Coronary artery disease involving native coronary artery of native heart without angina pectoris Assessment & Plan: Followed by Dr End. CTA as outlined. Continue farxiga  and crestor . Currently stable. No changes today.    Chronic heart failure with preserved ejection fraction (HFpEF) (HCC) Assessment & Plan: NYHA class II HFpEF. Recent echocardiogram showed LVEF of 55-60% with grade 2 diastolic dysfunction. Followed by Dr End.  Continue farxiga  and lisinopril /hydrochlorothiazide .  May need to adjust medication as outlined. Recheck met b today.    Aortic atherosclerosis (HCC) Assessment &  Plan: Continue crestor .    Other orders -     Potassium Chloride  Crys ER; Take 1 tablet (10 mEq total) by mouth daily.  Dispense: 90 tablet; Refill: 1     Allena Hamilton, MD

## 2023-08-15 NOTE — Assessment & Plan Note (Addendum)
 Physical today 08/15/23.  Mammogram 11/18/22 - Birads 0.  F/u left breast mammogram - Birads I. Cologuard 01/2020 - negative. Cologuard 02/2023 - negative.

## 2023-08-16 ENCOUNTER — Ambulatory Visit: Payer: Self-pay | Admitting: Internal Medicine

## 2023-08-16 DIAGNOSIS — E1165 Type 2 diabetes mellitus with hyperglycemia: Secondary | ICD-10-CM

## 2023-08-16 DIAGNOSIS — I10 Essential (primary) hypertension: Secondary | ICD-10-CM

## 2023-08-16 DIAGNOSIS — R944 Abnormal results of kidney function studies: Secondary | ICD-10-CM

## 2023-08-16 LAB — BASIC METABOLIC PANEL WITH GFR
BUN: 29 mg/dL — ABNORMAL HIGH (ref 6–23)
CO2: 28 meq/L (ref 19–32)
Calcium: 9.3 mg/dL (ref 8.4–10.5)
Chloride: 100 meq/L (ref 96–112)
Creatinine, Ser: 1.32 mg/dL — ABNORMAL HIGH (ref 0.40–1.20)
GFR: 39.61 mL/min — ABNORMAL LOW (ref >60.00)
Glucose, Bld: 92 mg/dL (ref 70–99)
Potassium: 4.4 meq/L (ref 3.5–5.1)
Sodium: 137 meq/L (ref 135–145)

## 2023-08-17 MED ORDER — LISINOPRIL 20 MG PO TABS: 20.0000 mg | ORAL_TABLET | Freq: Every day | ORAL | 1 refills | Status: DC

## 2023-08-20 ENCOUNTER — Encounter: Payer: Self-pay | Admitting: Internal Medicine

## 2023-08-20 NOTE — Assessment & Plan Note (Signed)
 Followed by Dr End. CTA as outlined. Continue farxiga  and crestor . Currently stable. No changes today.

## 2023-08-20 NOTE — Assessment & Plan Note (Signed)
Continue citalopram.   Follow.

## 2023-08-20 NOTE — Assessment & Plan Note (Signed)
 Continue farxiga . Low carb diet and exercise. Follow met b and A1c.  Discussed recent labs. Stay hydrated.  Lab Results  Component Value Date   HGBA1C 6.4 08/11/2023

## 2023-08-20 NOTE — Assessment & Plan Note (Signed)
 Vitamin D  08/11/23 - wnl.

## 2023-08-20 NOTE — Assessment & Plan Note (Signed)
 Continue crestor . Low cholesterol diet and exercise.  Follow lipid panel and liver function tests.  No changes in medication today.  Lab Results  Component Value Date   CHOL 139 08/11/2023   HDL 72.50 08/11/2023   LDLCALC 53 08/11/2023   TRIG 63.0 08/11/2023   CHOLHDL 2 08/11/2023

## 2023-08-20 NOTE — Assessment & Plan Note (Signed)
 Blood pressure as outlined. Continue lisinopril /hydrochlorothiazide . Discussed recent labs and decreased GFR. Discussed staying hydrated. Will recheck met b today. If persistent decrease, discussed changing lisinopril /hydrochlorothiazide  to lisinopril  20mg  q day. Will need to stop potassium if makes this change. Follow pressures.

## 2023-08-20 NOTE — Assessment & Plan Note (Signed)
 NYHA class II HFpEF. Recent echocardiogram showed LVEF of 55-60% with grade 2 diastolic dysfunction. Followed by Dr End.  Continue farxiga  and lisinopril /hydrochlorothiazide .  May need to adjust medication as outlined. Recheck met b today.

## 2023-08-20 NOTE — Assessment & Plan Note (Signed)
 Continue crestor

## 2023-08-31 ENCOUNTER — Other Ambulatory Visit (INDEPENDENT_AMBULATORY_CARE_PROVIDER_SITE_OTHER)

## 2023-08-31 DIAGNOSIS — R944 Abnormal results of kidney function studies: Secondary | ICD-10-CM

## 2023-08-31 LAB — BASIC METABOLIC PANEL WITH GFR
BUN: 15 mg/dL (ref 6–23)
CO2: 31 meq/L (ref 19–32)
Calcium: 9 mg/dL (ref 8.4–10.5)
Chloride: 103 meq/L (ref 96–112)
Creatinine, Ser: 0.94 mg/dL (ref 0.40–1.20)
GFR: 59.51 mL/min — ABNORMAL LOW (ref >60.00)
Glucose, Bld: 87 mg/dL (ref 70–99)
Potassium: 3.9 meq/L (ref 3.5–5.1)
Sodium: 141 meq/L (ref 135–145)

## 2023-09-01 ENCOUNTER — Ambulatory Visit: Payer: Self-pay | Admitting: Internal Medicine

## 2023-09-01 DIAGNOSIS — I10 Essential (primary) hypertension: Secondary | ICD-10-CM

## 2023-09-01 DIAGNOSIS — E1165 Type 2 diabetes mellitus with hyperglycemia: Secondary | ICD-10-CM

## 2023-09-14 ENCOUNTER — Encounter: Payer: Self-pay | Admitting: Nurse Practitioner

## 2023-09-14 ENCOUNTER — Ambulatory Visit: Payer: Self-pay

## 2023-09-14 ENCOUNTER — Ambulatory Visit: Admitting: Nurse Practitioner

## 2023-09-14 VITALS — BP 152/86 | HR 96 | Resp 16 | Ht 65.0 in | Wt 187.4 lb

## 2023-09-14 DIAGNOSIS — I10 Essential (primary) hypertension: Secondary | ICD-10-CM

## 2023-09-14 MED ORDER — LISINOPRIL 5 MG PO TABS: 5.0000 mg | ORAL_TABLET | Freq: Every day | ORAL | 0 refills | Status: DC

## 2023-09-14 NOTE — Progress Notes (Signed)
 BP (!) 152/86   Pulse 96   Resp 16   Ht 5' 5 (1.651 m)   Wt 187 lb 6.4 oz (85 kg)   SpO2 98%   BMI 31.18 kg/m    Subjective:    Patient ID: Tabitha Lewis, female    DOB: 1950-10-26, 73 y.o.   MRN: [id number]  HPI: Tabitha Lewis is a 73 y.o. female  Chief Complaint  Patient presents with   Hypertension    Used to take lisinopril  hctz, recently switched to Lisinopril  20mg  end of July. Noticed BP slightly elevated last week. Headache last 2-3 days. Denies dizziness, lightheadedness. Taken Tylenol  everyday for the last few days for headaches, little to no relief.    Headache    Discussed the use of AI scribe software for clinical note transcription with the patient, who gave verbal consent to proceed.  History of Present Illness Tabitha Lewis is a 73 year old female with hypertension who presents with elevated blood pressure readings.  Elevated blood pressure - Elevated blood pressure readings, most recent measurement 159/89 mmHg - Blood pressure has been gradually increasing - Blood pressure measured at 164 mmHg this morning while sitting quietly  Antihypertensive medication adjustment - Medication changed from lisinopril -hydrochlorothiazide  to lisinopril  20 mg daily on August 15, 2023 - Medication adjustment due to decreased glomerular filtration rate (GFR) - GFR measured at 59 after medication change  Headache - Headaches present - No dizziness, lightheadedness, chest pain, or shortness of breath         09/14/2023    1:11 PM 08/15/2023    2:43 PM 03/29/2023    1:17 PM  Depression screen PHQ 2/9  Decreased Interest 0 0 0  Down, Depressed, Hopeless 0 0 0  PHQ - 2 Score 0 0 0  Altered sleeping  0 0  Tired, decreased energy  1 2  Change in appetite  0 0  Feeling bad or failure about yourself   0 0  Trouble concentrating  0 0  Moving slowly or fidgety/restless  0 0  Suicidal thoughts  0 0  PHQ-9 Score  1 2  Difficult doing work/chores  Not  difficult at all Somewhat difficult    Relevant past medical, surgical, family and social history reviewed and updated as indicated. Interim medical history since our last visit reviewed. Allergies and medications reviewed and updated.  Review of Systems  Ten systems reviewed and is negative except as mentioned in HPI      Objective:     BP (!) 152/86   Pulse 96   Resp 16   Ht 5' 5 (1.651 m)   Wt 187 lb 6.4 oz (85 kg)   SpO2 98%   BMI 31.18 kg/m    Wt Readings from Last 3 Encounters:  09/14/23 187 lb 6.4 oz (85 kg)  08/15/23 173 lb 6.4 oz (78.7 kg)  07/12/23 174 lb 6.4 oz (79.1 kg)    Physical Exam Physical Exam GENERAL: Alert, cooperative, well developed, no acute distress. HEENT: Normocephalic, normal oropharynx, moist mucous membranes. CHEST: Clear to auscultation bilaterally, no wheezes, rhonchi, or crackles. CARDIOVASCULAR: Normal heart rate and rhythm, S1 and S2 normal without murmurs. ABDOMEN: Soft, non-tender, non-distended, without organomegaly, normal bowel sounds. EXTREMITIES: No cyanosis or edema. NEUROLOGICAL: Cranial nerves grossly intact, moves all extremities without gross motor or sensory deficit.   Results for orders placed or performed in visit on 08/31/23  Basic metabolic panel with GFR   Collection Time: 08/31/23  1:51 PM  Result Value Ref Range   Sodium 141 135 - 145 mEq/L   Potassium 3.9 3.5 - 5.1 mEq/L   Chloride 103 96 - 112 mEq/L   CO2 31 19 - 32 mEq/L   Glucose, Bld 87 70 - 99 mg/dL   BUN 15 6 - 23 mg/dL   Creatinine, Ser 9.05 0.40 - 1.20 mg/dL   GFR 40.48 (L) >39.99 mL/min   Calcium  9.0 8.4 - 10.5 mg/dL          Assessment & Plan:   Problem List Items Addressed This Visit       Cardiovascular and Mediastinum   Essential hypertension - Primary   Relevant Medications   lisinopril  (ZESTRIL ) 5 MG tablet     Assessment and Plan Assessment & Plan Hypertension Hypertension with recent blood pressure reading of 159/89 mmHg.  Previously on lisinopril -hydrochlorothiazide , switched to lisinopril  20 mg daily due to decreased GFR. GFR improved after medication change. Current blood pressure readings indicate an upward trend, with a recent reading of 164 mmHg. Reports headache but denies dizziness, lightheadedness, chest pain, or shortness of breath. - Increase lisinopril  to 25 mg daily by adding a 5 mg dose to the current 20 mg dose. - Monitor blood pressure regularly to ensure it does not drop too low. - Follow up with PCP in two weeks to assess blood pressure control and overall health.        Follow up plan: Return in about 2 weeks (around 09/28/2023) for follow up.

## 2023-09-14 NOTE — Telephone Encounter (Signed)
 Patient was evaluated today and 2 week follow up scheduled with Dr Glendia.

## 2023-09-14 NOTE — Telephone Encounter (Signed)
 FYI Only or Action Required?: FYI only for provider.  Patient was last seen in primary care on 08/15/2023 by Glendia Shad, MD.  Called Nurse Triage reporting Hypertension.  Symptoms began several days ago.  Interventions attempted: OTC medications: Tylenol  .  Symptoms are: stable.  Triage Disposition: See PCP When Office is Open (Within 3 Days)  Patient/caregiver understands and will follow disposition?: Yes      Copied from CRM #[id number]. Topic: Clinical - Red Word Triage >> Sep 14, 2023 12:01 PM Harlene ORN wrote: Red Word that prompted transfer to Nurse Triage: High Blood pressure today: 159 over 89 and headaches Reason for Disposition  Systolic BP >= 160 OR Diastolic >= 100  Answer Assessment - Initial Assessment Questions Used to take lisinopril  hctz, recently switched to Lisinopril  20mg  end of July. Noticed BP slightly elevated last week. Headache last 2-3 days. Denies dizziness, lightheadedness. Taken Tylenol  everyday for the last few days for headaches, little to no relief.   1. BLOOD PRESSURE: What is your blood pressure? Did you take at least two measurements 5 minutes apart?     159/89  2. ONSET: When did you take your blood pressure?     45 minutes ago  3. HOW: How did you take your blood pressure? (e.g., automatic home BP monitor, visiting nurse)     Automatic BP monitor.   4. HISTORY: Do you have a history of high blood pressure?     Yes  5. MEDICINES: Are you taking any medicines for blood pressure? Have you missed any doses recently?     Yes, not missed dose  6. OTHER SYMPTOMS: Do you have any symptoms? (e.g., blurred vision, chest pain, difficulty breathing, headache, weakness)     Headache  Protocols used: Blood Pressure - High-A-AH

## 2023-09-22 ENCOUNTER — Other Ambulatory Visit: Payer: Self-pay | Admitting: Internal Medicine

## 2023-10-03 ENCOUNTER — Ambulatory Visit: Admitting: Internal Medicine

## 2023-10-03 VITALS — BP 114/72 | HR 94 | Resp 16 | Ht 65.0 in | Wt 172.6 lb

## 2023-10-03 DIAGNOSIS — I10 Essential (primary) hypertension: Secondary | ICD-10-CM

## 2023-10-03 DIAGNOSIS — F320 Major depressive disorder, single episode, mild: Secondary | ICD-10-CM

## 2023-10-03 DIAGNOSIS — Z7984 Long term (current) use of oral hypoglycemic drugs: Secondary | ICD-10-CM | POA: Diagnosis not present

## 2023-10-03 DIAGNOSIS — R519 Headache, unspecified: Secondary | ICD-10-CM | POA: Diagnosis not present

## 2023-10-03 DIAGNOSIS — E1165 Type 2 diabetes mellitus with hyperglycemia: Secondary | ICD-10-CM | POA: Diagnosis not present

## 2023-10-03 LAB — CBC WITH DIFFERENTIAL/PLATELET
Basophils Absolute: 0.1 K/uL (ref 0.0–0.1)
Basophils Relative: 1.1 % (ref 0.0–3.0)
Eosinophils Absolute: 0.1 K/uL (ref 0.0–0.7)
Eosinophils Relative: 1.2 % (ref 0.0–5.0)
HCT: 41.9 % (ref 36.0–46.0)
Hemoglobin: 13.7 g/dL (ref 12.0–15.0)
Lymphocytes Relative: 16.5 % (ref 12.0–46.0)
Lymphs Abs: 1.1 K/uL (ref 0.7–4.0)
MCHC: 32.7 g/dL (ref 30.0–36.0)
MCV: 87.4 fl (ref 78.0–100.0)
Monocytes Absolute: 0.6 K/uL (ref 0.1–1.0)
Monocytes Relative: 9.7 % (ref 3.0–12.0)
Neutro Abs: 4.7 K/uL (ref 1.4–7.7)
Neutrophils Relative %: 71.5 % (ref 43.0–77.0)
Platelets: 306 K/uL (ref 150.0–400.0)
RBC: 4.79 Mil/uL (ref 3.87–5.11)
RDW: 13.3 % (ref 11.5–15.5)
WBC: 6.6 K/uL (ref 4.0–10.5)

## 2023-10-03 LAB — C-REACTIVE PROTEIN: CRP: <1 mg/dL (ref 0.5–20.0)

## 2023-10-03 LAB — SEDIMENTATION RATE: Sed Rate: 27 mm/h (ref 0–30)

## 2023-10-03 NOTE — Progress Notes (Unsigned)
 Subjective:    Patient ID: Tabitha Lewis, female    DOB: 16-Jan-1951, 73 y.o.   MRN: [id number]  Patient here for  Chief Complaint  Patient presents with   Hypertension   Headache    HPI Here for a scheduled follow up - follow up regarding elevated blood pressure. I saw her 08/15/23 - and lisinopril /hydrochlorothiazide  was changed to lisinopril  20mg  q day due to decrease in GFR. She was seen for an acute visit, by Mliss Spray for elevated blood pressure and lisinopril  5mg  was added. Her blood pressure stayed elevated and she changed back to taking lisinopril /hydrochlorothiazide . Blood pressure is doing better now and back within normal range. She is still having headaches. Headace started 09/14/23. Has been constant. May have some days that are better, but the headache never completely resolves. No dizziness or light headedness. No vision change. Localized - frontal headache and over the left eye. No recent illness. No bite. No rash. No neck pain or stiffness. She did describe not being able to write a check - after furniture delivered. She could not remember how to spell [place] (but did look up the correct spelling) and had problems writing the amount for the check. No slurring of speech. No facial drooping. No other episodes like this. Denies any chest pain or sob. No syncope or near syncope. No vomiting or diarrhea.    Past Medical History:  Diagnosis Date   AKI (acute kidney injury) (HCC)    03/2020-04/2020   Allergy    i.e contact allergies [facility]; also plants, trees    Chicken pox    Depression    Diabetes mellitus without complication (HCC)    DIET CONTROLLED   Family history of breast cancer    BRCA negative in the past   Family history of breast cancer    Family history of lung cancer    Family history of rectal cancer    History of prediabetes    Hypertension    Interstitial cystitis    Interstitial cystitis    Spinal stenosis    UTI (urinary  tract infection)    UTI (urinary tract infection)    Past Surgical History:  Procedure Laterality Date   CATARACT EXTRACTION Bilateral    b/l Dr. 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   Procedure: TOTAL HIP ARTHROPLASTY ANTERIOR APPROACH;  Surgeon: Kathlynn Sharper, MD;  Location: [facility] ORS;  Service: Orthopedics;  Laterality: Right;   Family History  Problem Relation Age of Onset   Heart attack Mother 82   Breast cancer Mother 52       dx 46, 9   COPD Mother    Heart failure Mother    Leukemia Mother    Stroke Father        age 76    Breast cancer Sister 54   Other Daughter        benign brain tumor   Polycystic ovary syndrome Daughter    Breast cancer Maternal Aunt        dx >50   Lung cancer Maternal Uncle    Breast cancer Maternal Grandmother    Breast cancer Cousin        mat cousins x4   Stroke Other        grandmother age 77    Lupus Other    Social History   Socioeconomic History   Marital status: Widowed    Spouse name: Not on file   Number of  children: 2   Years of education: Not on file   Highest education level: Not on file  Occupational History   Not on file  Tobacco Use   Smoking status: Former    Current packs/day: 0.00    Average packs/day: 1 pack/day for 7.0 years (7.0 ttl pk-yrs)    Types: Cigarettes    Start date: 35    Quit date: 57    Years since quitting: 52.7   Smokeless tobacco: Never  Vaping Use   Vaping status: Never Used  Substance and Sexual Activity   Alcohol use: No   Drug use: No   Sexual activity: Not on file  Other Topics Concern   Not on file  Social History Narrative   1 daughter and 1 son    3 grandsons    Married to high school sweet heart (husband) x 47 years dx'ed with prostate cancer in 10/22/2015 he died 06/20/17    Retired Optometrist education [employer]   Social Drivers of Health   Financial Resource Strain: Low Risk  (07/19/2023)   Received from [facility]   Overall  Financial Resource Strain (CARDIA)    Difficulty of Paying Living Expenses: Not very hard  Food Insecurity: No Food Insecurity (07/19/2023)   Received from [facility]   Hunger Vital Sign    Within the past 12 months, you worried that your food would run out before you got the money to buy more.: Never true    Within the past 12 months, the food you bought just didn't last and you didn't have money to get more.: Never true  Transportation Needs: No Transportation Needs (07/19/2023)   Received from [facility] - Transportation    In the past 12 months, has lack of transportation kept you from medical appointments or from getting medications?: No    Lack of Transportation (Non-Medical): No  Physical Activity: Sufficiently Active (10/19/2022)   Exercise Vital Sign    Days of Exercise per Week: 3 days    Minutes of Exercise per Session: 60 min  Stress: No Stress Concern Present (10/19/2022)   [employer] of Occupational Health - Occupational Stress Questionnaire    Feeling of Stress : Only a little  Social Connections: Socially Isolated (10/19/2022)   Social Connection and Isolation Panel    Frequency of Communication with Friends and Family: More than three times a week    Frequency of Social Gatherings with Friends and Family: Twice a week    Attends Religious Services: Never    Database administrator or Organizations: No    Attends Banker Meetings: Never    Marital Status: Widowed     Review of Systems  Constitutional:  Negative for appetite change and unexpected weight change.  HENT:  Negative for congestion and sinus pressure.   Respiratory:  Negative for cough, chest tightness and shortness of breath.   Cardiovascular:  Negative for chest pain, palpitations and leg swelling.  Gastrointestinal:  Negative for abdominal pain, diarrhea, nausea and vomiting.  Genitourinary:  Negative for difficulty urinating and dysuria.   Musculoskeletal:  Negative for joint swelling and myalgias.  Skin:  Negative for color change and rash.  Neurological:  Positive for headaches. Negative for dizziness.  Psychiatric/Behavioral:  Negative for agitation and dysphoric mood.        Objective:     BP 114/72   Pulse 94   Resp 16  Ht 5' 5 (1.651 m)   Wt 172 lb 9.6 oz (78.3 kg)   SpO2 98%   BMI 28.72 kg/m  Wt Readings from Last 3 Encounters:  10/03/23 172 lb 9.6 oz (78.3 kg)  09/14/23 187 lb 6.4 oz (85 kg)  08/15/23 173 lb 6.4 oz (78.7 kg)    Physical Exam Vitals reviewed.  Constitutional:      General: She is not in acute distress.    Appearance: Normal appearance.  HENT:     Head: Normocephalic and atraumatic.     Right Ear: External ear normal.     Left Ear: External ear normal.  Eyes:     General: No scleral icterus.       Right eye: No discharge.        Left eye: No discharge.     Conjunctiva/sclera: Conjunctivae normal.  Neck:     Thyroid : No thyromegaly.  Cardiovascular:     Rate and Rhythm: Normal rate and regular rhythm.  Pulmonary:     Effort: No respiratory distress.     Breath sounds: Normal breath sounds. No wheezing.  Abdominal:     General: Bowel sounds are normal.     Palpations: Abdomen is soft.     Tenderness: There is no abdominal tenderness.  Musculoskeletal:        General: No swelling or tenderness.     Cervical back: Neck supple. No tenderness.  Lymphadenopathy:     Cervical: No cervical adenopathy.  Skin:    Findings: No erythema or rash.  Neurological:     Mental Status: She is alert.     Comments: No focal neurological deficits noted on exam.   Psychiatric:        Mood and Affect: Mood normal.        Behavior: Behavior normal.         Outpatient Encounter Medications as of 10/03/2023  Medication Sig   Cholecalciferol (VITAMIN D3 PO) Take 2,000 Units by mouth daily.   citalopram  (CELEXA ) 10 MG tablet TAKE 1 TABLET BY MOUTH EVERY DAY   dapagliflozin  propanediol  (FARXIGA ) 10 MG TABS tablet TAKE 1 TABLET BY MOUTH DAILY BEFORE BREAKFAST.   docusate sodium  (COLACE) 100 MG capsule Take 1 capsule (100 mg total) by mouth 2 (two) times daily as needed for mild constipation.   lisinopril  (ZESTRIL ) 20 MG tablet Take 1 tablet (20 mg total) by mouth daily.   lisinopril  (ZESTRIL ) 5 MG tablet Take 1 tablet (5 mg total) by mouth daily. Take with 20 mg dose   Multiple Vitamin (MULTIVITAMIN) capsule Take 1 capsule by mouth daily.   potassium chloride  (KLOR-CON  M) 10 MEQ tablet Take 1 tablet (10 mEq total) by mouth daily.   rosuvastatin  (CRESTOR ) 5 MG tablet TAKE 1 TABLET (5 MG TOTAL) BY MOUTH DAILY.   traMADol  (ULTRAM ) 50 MG tablet Take 1 tablet (50 mg total) by mouth every 12 (twelve) hours as needed.   traZODone  (DESYREL ) 100 MG tablet TAKE 1 TABLET BY MOUTH AT BEDTIME AS NEEDED FOR SLEEP.   [DISCONTINUED] famotidine  (PEPCID ) 20 MG tablet Take 1 tablet (20 mg total) by mouth daily. (Patient not taking: Reported on 09/14/2023)   No facility-administered encounter medications on file as of 10/03/2023.     Lab Results  Component Value Date   WBC 6.6 10/03/2023   HGB 13.7 10/03/2023   HCT 41.9 10/03/2023   PLT 306.0 10/03/2023   GLUCOSE 156 (H) 10/03/2023   CHOL 139 08/11/2023   TRIG 63.0 08/11/2023  HDL 72.50 08/11/2023   LDLCALC 53 08/11/2023   ALT 17 08/11/2023   AST 15 08/11/2023   NA 133 (L) 10/03/2023   K 3.4 (L) 10/03/2023   CL 99 10/03/2023   CREATININE 1.18 10/03/2023   BUN 23 10/03/2023   CO2 30 10/03/2023   TSH 3.08 03/27/2023   HGBA1C 6.4 08/11/2023   MICROALBUR 4.0 (H) 08/11/2023    MM 3D DIAGNOSTIC MAMMOGRAM UNILATERAL LEFT BREAST Result Date: 11/30/2022 CLINICAL DATA:  Screening recall for possible left breast asymmetry EXAM: DIGITAL DIAGNOSTIC UNILATERAL LEFT MAMMOGRAM WITH TOMOSYNTHESIS AND CAD TECHNIQUE: Left digital diagnostic mammography and breast tomosynthesis was performed. The images were evaluated with computer-aided detection.  COMPARISON:  Previous exam(s). ACR Breast Density Category c: The breasts are heterogeneously dense, which may obscure small masses. FINDINGS: The previously described possible asymmetry does not persist with additional views, consistent with superimposed fibroglandular tissue. No suspicious mass, microcalcification, or other finding is identified. IMPRESSION: The previously described possible asymmetry does not persist with additional views, consistent with superimposed fibroglandular tissue. No mammographic evidence of malignancy in the left breast. RECOMMENDATION: Return to routine screening mammography is recommended. The patient will be due for screening in November 2025. I have discussed the findings and recommendations with the patient. If applicable, a reminder letter will be sent to the patient regarding the next appointment. BI-RADS CATEGORY  1: Negative. Electronically Signed   By: Dirk Arrant M.D.   On: 11/30/2022 16:16       Assessment & Plan:  Nonintractable headache, unspecified chronicity pattern, unspecified headache type Assessment & Plan: Persistent headache since the end of August. Frontal headache as outlined. Not taking tylenol . Discussed could take. No focal neurological deficits noted on exam. No vision change. No recent infection, bite or rash. No neck pain or stiffness. One episodes of problems writing a check, but no other confusion, etc. Discussed possible temporal arteritis. Will check basic labs, including cbc and metabolic panel. Also check esr and crp. If normal, discussed obtaining MRI. Follow closely. Consider referral to neurology.   Orders: -     CBC with Differential/Platelet -     Sedimentation rate -     Basic metabolic panel with GFR -     C-reactive protein -     MR BRAIN WO CONTRAST; Future  Type 2 diabetes mellitus with hyperglycemia, without long-term current use of insulin (HCC) Assessment & Plan: Continue farxiga . Low carb diet and exercise. Follow  met b and A1c.  No changes today.  Lab Results  Component Value Date   HGBA1C 6.4 08/11/2023      Primary hypertension Assessment & Plan:  I saw her 08/15/23 - and lisinopril /hydrochlorothiazide  was changed to lisinopril  20mg  q day due to decrease in GFR. She was seen for an acute visit, by Mliss Spray for elevated blood pressure and lisinopril  5mg  was added. Her blood pressure stayed elevated and she changed back to taking lisinopril /hydrochlorothiazide . Blood pressure is doing better now and back within normal range. Will check metabolic panel today to confirm electrolytes and kidney function stable. Discussed other treatment options.    Depression, major, single episode, mild (HCC) Assessment & Plan: Continue citalopram . Follow.    I spent 45 minutes with the patient. Specifically time spent discussing her persistent headache and current symptoms and plans for further w/up.  Time spent discussing her current concerns and symptoms.  Time also spent discussing further w/up, evaluation and treatment.     Allena Hamilton, MD

## 2023-10-04 ENCOUNTER — Ambulatory Visit: Payer: Self-pay | Admitting: Internal Medicine

## 2023-10-04 ENCOUNTER — Encounter: Payer: Self-pay | Admitting: Internal Medicine

## 2023-10-04 DIAGNOSIS — M47816 Spondylosis without myelopathy or radiculopathy, lumbar region: Secondary | ICD-10-CM

## 2023-10-04 DIAGNOSIS — M542 Cervicalgia: Secondary | ICD-10-CM

## 2023-10-04 DIAGNOSIS — M1612 Unilateral primary osteoarthritis, left hip: Secondary | ICD-10-CM

## 2023-10-04 DIAGNOSIS — G8929 Other chronic pain: Secondary | ICD-10-CM

## 2023-10-04 DIAGNOSIS — M1611 Unilateral primary osteoarthritis, right hip: Secondary | ICD-10-CM

## 2023-10-04 DIAGNOSIS — M1712 Unilateral primary osteoarthritis, left knee: Secondary | ICD-10-CM

## 2023-10-04 DIAGNOSIS — R944 Abnormal results of kidney function studies: Secondary | ICD-10-CM

## 2023-10-04 DIAGNOSIS — R937 Abnormal findings on diagnostic imaging of other parts of musculoskeletal system: Secondary | ICD-10-CM

## 2023-10-04 DIAGNOSIS — I10 Essential (primary) hypertension: Secondary | ICD-10-CM

## 2023-10-04 NOTE — Assessment & Plan Note (Signed)
 Persistent headache since the end of August. Frontal headache as outlined. Not taking tylenol . Discussed could take. No focal neurological deficits noted on exam. No vision change. No recent infection, bite or rash. No neck pain or stiffness. One episodes of problems writing a check, but no other confusion, etc. Discussed possible temporal arteritis. Will check basic labs, including cbc and metabolic panel. Also check esr and crp. If normal, discussed obtaining MRI. Follow closely. Consider referral to neurology.

## 2023-10-04 NOTE — Assessment & Plan Note (Signed)
 I saw her 08/15/23 - and lisinopril /hydrochlorothiazide  was changed to lisinopril  20mg  q day due to decrease in GFR. She was seen for an acute visit, by Mliss Spray for elevated blood pressure and lisinopril  5mg  was added. Her blood pressure stayed elevated and she changed back to taking lisinopril /hydrochlorothiazide . Blood pressure is doing better now and back within normal range. Will check metabolic panel today to confirm electrolytes and kidney function stable. Discussed other treatment options.

## 2023-10-04 NOTE — Assessment & Plan Note (Signed)
 Continue farxiga . Low carb diet and exercise. Follow met b and A1c.  No changes today.  Lab Results  Component Value Date   HGBA1C 6.4 08/11/2023

## 2023-10-04 NOTE — Assessment & Plan Note (Signed)
Continue citalopram.   Follow.

## 2023-10-05 DIAGNOSIS — R251 Tremor, unspecified: Secondary | ICD-10-CM | POA: Diagnosis not present

## 2023-10-05 DIAGNOSIS — R519 Headache, unspecified: Secondary | ICD-10-CM | POA: Diagnosis not present

## 2023-10-07 LAB — BASIC METABOLIC PANEL WITH GFR
BUN: 23 mg/dL (ref 6–23)
CO2: 30 meq/L (ref 19–32)
Calcium: 10 mg/dL (ref 8.4–10.5)
Chloride: 99 meq/L (ref 96–112)
Creatinine, Ser: 1.18 mg/dL (ref 0.40–1.20)
GFR: 45.27 mL/min — ABNORMAL LOW (ref >60.00)
Glucose, Bld: 156 mg/dL — ABNORMAL HIGH (ref 70–99)
Potassium: 3.7 meq/L (ref 3.5–5.1)
Sodium: 137 meq/L (ref 135–145)

## 2023-10-09 ENCOUNTER — Telehealth: Payer: Self-pay | Admitting: Internal Medicine

## 2023-10-09 NOTE — Telephone Encounter (Signed)
 See phone note

## 2023-10-11 ENCOUNTER — Other Ambulatory Visit: Payer: Self-pay | Admitting: Nurse Practitioner

## 2023-10-11 ENCOUNTER — Other Ambulatory Visit: Payer: Self-pay | Admitting: Internal Medicine

## 2023-10-11 DIAGNOSIS — M542 Cervicalgia: Secondary | ICD-10-CM

## 2023-10-11 DIAGNOSIS — M47816 Spondylosis without myelopathy or radiculopathy, lumbar region: Secondary | ICD-10-CM

## 2023-10-11 DIAGNOSIS — G8929 Other chronic pain: Secondary | ICD-10-CM

## 2023-10-11 DIAGNOSIS — M1612 Unilateral primary osteoarthritis, left hip: Secondary | ICD-10-CM

## 2023-10-11 DIAGNOSIS — R937 Abnormal findings on diagnostic imaging of other parts of musculoskeletal system: Secondary | ICD-10-CM

## 2023-10-11 DIAGNOSIS — I10 Essential (primary) hypertension: Secondary | ICD-10-CM

## 2023-10-11 DIAGNOSIS — M1712 Unilateral primary osteoarthritis, left knee: Secondary | ICD-10-CM

## 2023-10-11 DIAGNOSIS — M1611 Unilateral primary osteoarthritis, right hip: Secondary | ICD-10-CM

## 2023-10-11 MED ORDER — TRAMADOL HCL 50 MG PO TABS: 50.0000 mg | ORAL_TABLET | Freq: Two times a day (BID) | ORAL | 0 refills | Status: AC | PRN

## 2023-10-11 MED ORDER — LISINOPRIL 40 MG PO TABS: 40.0000 mg | ORAL_TABLET | Freq: Every day | ORAL | 1 refills | Status: DC

## 2023-10-11 NOTE — Progress Notes (Signed)
 Rx sent in for tramadol . Called Tabitha Lewis. No history of seiures. Rarely uses. PDMP reviewed. Also called in lisinopril  40mg  q day. Canceled rx for 5mg  and 20mg  of lisinopril .

## 2023-10-12 ENCOUNTER — Ambulatory Visit
Admission: RE | Admit: 2023-10-12 | Discharge: 2023-10-12 | Disposition: A | Discharge status: Home / Self Care | Source: Ambulatory Visit | Attending: Internal Medicine | Admitting: Internal Medicine

## 2023-10-12 DIAGNOSIS — J341 Cyst and mucocele of nose and nasal sinus: Secondary | ICD-10-CM | POA: Diagnosis not present

## 2023-10-12 DIAGNOSIS — I6782 Cerebral ischemia: Secondary | ICD-10-CM | POA: Diagnosis not present

## 2023-10-12 DIAGNOSIS — R251 Tremor, unspecified: Secondary | ICD-10-CM | POA: Diagnosis not present

## 2023-10-12 DIAGNOSIS — R519 Headache, unspecified: Secondary | ICD-10-CM | POA: Diagnosis not present

## 2023-10-12 NOTE — Telephone Encounter (Signed)
 Unable to refill per protocol, Rx expired. Discontinued 10/11/23.  Requested Prescriptions  Pending Prescriptions Disp Refills   lisinopril  (ZESTRIL ) 5 MG tablet [Pharmacy Med Name: LISINOPRIL  5 MG TABLET] 30 tablet 0    Sig: TAKE 1 TABLET (5 MG TOTAL) BY MOUTH DAILY.     Cardiovascular:  ACE Inhibitors Failed - 10/12/2023  1:13 PM      Failed - Valid encounter within last 6 months    Recent Outpatient Visits           4 weeks ago Essential hypertension   [facility] Gareth Mliss [place], [place]              Passed - Cr in normal range and within 180 days    Creat  Date Value Ref Range Status  12/23/2016 0.85 0.50 - 0.99 mg/dL Final    Comment:    For patients >87 years of age, the reference limit for Creatinine is approximately 13% higher for people identified as African-American. .    Creatinine, Ser  Date Value Ref Range Status  10/03/2023 1.18 0.40 - 1.20 mg/dL Final   Creatinine,U  Date Value Ref Range Status  08/11/2023 193.5 mg/dL Final         Passed - K in normal range and within 180 days    Potassium  Date Value Ref Range Status  10/03/2023 3.7 3.5 - 5.1 mEq/L Corrected         Passed - Patient is not pregnant      Passed - Last BP in normal range    BP Readings from Last 1 Encounters:  10/04/23 114/72

## 2023-10-16 ENCOUNTER — Ambulatory Visit: Payer: Self-pay | Admitting: Internal Medicine

## 2023-10-16 ENCOUNTER — Other Ambulatory Visit (INDEPENDENT_AMBULATORY_CARE_PROVIDER_SITE_OTHER)

## 2023-10-16 DIAGNOSIS — R944 Abnormal results of kidney function studies: Secondary | ICD-10-CM

## 2023-10-16 LAB — BASIC METABOLIC PANEL WITH GFR
BUN: 15 mg/dL (ref 6–23)
CO2: 30 meq/L (ref 19–32)
Calcium: 9.2 mg/dL (ref 8.4–10.5)
Chloride: 105 meq/L (ref 96–112)
Creatinine, Ser: 1.01 mg/dL (ref 0.40–1.20)
GFR: 54.55 mL/min — ABNORMAL LOW (ref >60.00)
Glucose, Bld: 151 mg/dL — ABNORMAL HIGH (ref 70–99)
Potassium: 3.7 meq/L (ref 3.5–5.1)
Sodium: 142 meq/L (ref 135–145)

## 2023-10-17 NOTE — Telephone Encounter (Signed)
Ok to schedule appt

## 2023-10-18 NOTE — Telephone Encounter (Signed)
 Reviewed blood pressure readings. Varying, but overall appear to be ok.continue to monitor and keep us  posted on how they are doing and how she is doing.

## 2023-10-18 NOTE — Telephone Encounter (Signed)
 I talked with her yesterday. She did not feel that she needs to come in sooner than her November appt after talking with her but will let me know if she wants to be seen sooner.  She also dropped off BP readings:  123/81 128/76 109/72  123/74 95/63 95/71  96/65 103/65 110/66 121/74 125/75 92/62 111/75 146/78 147/78 122/73 117/70  She has a couple readings 90s/60s- confirmed with patient she is doing ok, no acute symptoms. No dizziness, lightheadedness, etc.

## 2023-11-06 ENCOUNTER — Ambulatory Visit

## 2023-11-07 ENCOUNTER — Ambulatory Visit: Payer: Self-pay

## 2023-11-07 ENCOUNTER — Emergency Department

## 2023-11-07 ENCOUNTER — Emergency Department
Admission: EM | Admit: 2023-11-07 | Discharge: 2023-11-07 | Disposition: A | Discharge status: Home / Self Care | Attending: Emergency Medicine | Admitting: Emergency Medicine

## 2023-11-07 ENCOUNTER — Other Ambulatory Visit: Payer: Self-pay

## 2023-11-07 DIAGNOSIS — R519 Headache, unspecified: Secondary | ICD-10-CM | POA: Diagnosis not present

## 2023-11-07 DIAGNOSIS — I10 Essential (primary) hypertension: Secondary | ICD-10-CM | POA: Diagnosis not present

## 2023-11-07 LAB — BASIC METABOLIC PANEL WITH GFR
Anion gap: 10 (ref 5–15)
BUN: 15 mg/dL (ref 8–23)
CO2: 26 mmol/L (ref 22–32)
Calcium: 9.1 mg/dL (ref 8.9–10.3)
Chloride: 104 mmol/L (ref 98–111)
Creatinine, Ser: 0.93 mg/dL (ref 0.44–1.00)
GFR, Estimated: >60 mL/min (ref >60)
Glucose, Bld: 93 mg/dL (ref 70–99)
Potassium: 3.6 mmol/L (ref 3.5–5.1)
Sodium: 140 mmol/L (ref 135–145)

## 2023-11-07 LAB — URINALYSIS, ROUTINE W REFLEX MICROSCOPIC
Bacteria, UA: NONE SEEN
Bilirubin Urine: NEGATIVE
Glucose, UA: >=500 mg/dL — AB
Hgb urine dipstick: NEGATIVE
Ketones, ur: 5 mg/dL — AB
Leukocytes,Ua: NEGATIVE
Nitrite: NEGATIVE
Protein, ur: NEGATIVE mg/dL
Specific Gravity, Urine: 1.01 (ref 1.005–1.030)
pH: 5 (ref 5.0–8.0)

## 2023-11-07 LAB — CBC WITH DIFFERENTIAL/PLATELET
Abs Immature Granulocytes: 0.01 K/uL (ref 0.00–0.07)
Basophils Absolute: 0 K/uL (ref 0.0–0.1)
Basophils Relative: 1 %
Eosinophils Absolute: 0.1 K/uL (ref 0.0–0.5)
Eosinophils Relative: 2 %
HCT: 39.9 % (ref 36.0–46.0)
Hemoglobin: 12.9 g/dL (ref 12.0–15.0)
Immature Granulocytes: 0 %
Lymphocytes Relative: 20 %
Lymphs Abs: 1.3 K/uL (ref 0.7–4.0)
MCH: 28.3 pg (ref 26.0–34.0)
MCHC: 32.3 g/dL (ref 30.0–36.0)
MCV: 87.5 fL (ref 80.0–100.0)
Monocytes Absolute: 0.6 K/uL (ref 0.1–1.0)
Monocytes Relative: 10 %
Neutro Abs: 4.3 K/uL (ref 1.7–7.7)
Neutrophils Relative %: 67 %
Platelets: 221 K/uL (ref 150–400)
RBC: 4.56 MIL/uL (ref 3.87–5.11)
RDW: 12.2 % (ref 11.5–15.5)
WBC: 6.4 K/uL (ref 4.0–10.5)
nRBC: 0 % (ref 0.0–0.2)

## 2023-11-07 MED ORDER — LIDOCAINE 5 % EX PTCH
1.0000 | MEDICATED_PATCH | CUTANEOUS | Status: DC
  Administered 2023-11-07: 1 via TRANSDERMAL
  Filled 2023-11-07: qty 1

## 2023-11-07 MED ORDER — CLONIDINE HCL 0.1 MG PO TABS
0.1000 mg | ORAL_TABLET | Freq: Once | ORAL | Status: AC
  Administered 2023-11-07: 0.1 mg via ORAL
  Filled 2023-11-07: qty 1

## 2023-11-07 MED ORDER — DIPHENHYDRAMINE HCL 25 MG PO CAPS
25.0000 mg | ORAL_CAPSULE | Freq: Once | ORAL | Status: AC
  Administered 2023-11-07: 25 mg via ORAL
  Filled 2023-11-07: qty 1

## 2023-11-07 MED ORDER — PROCHLORPERAZINE MALEATE 10 MG PO TABS
10.0000 mg | ORAL_TABLET | Freq: Once | ORAL | Status: AC
  Administered 2023-11-07: 10 mg via ORAL
  Filled 2023-11-07: qty 1

## 2023-11-07 MED ORDER — KETOROLAC TROMETHAMINE 30 MG/ML IJ SOLN
15.0000 mg | Freq: Once | INTRAMUSCULAR | Status: AC
  Administered 2023-11-07: 15 mg via INTRAVENOUS
  Filled 2023-11-07: qty 1

## 2023-11-07 MED ORDER — HYDRALAZINE HCL 50 MG PO TABS
50.0000 mg | ORAL_TABLET | Freq: Once | ORAL | Status: AC
  Administered 2023-11-07: 50 mg via ORAL
  Filled 2023-11-07: qty 1

## 2023-11-07 MED ORDER — HYDRALAZINE HCL 50 MG PO TABS: 50.0000 mg | ORAL_TABLET | Freq: Three times a day (TID) | ORAL | 3 refills | Status: DC

## 2023-11-07 MED ORDER — ACETAMINOPHEN 500 MG PO TABS
1000.0000 mg | ORAL_TABLET | Freq: Once | ORAL | Status: AC
  Administered 2023-11-07: 1000 mg via ORAL
  Filled 2023-11-07: qty 2

## 2023-11-07 NOTE — ED Triage Notes (Addendum)
 Pt to ED via POV for c/o hypertension. Pt has been compliant blood pressure medication, but states medication was changed about six weeks ago. Pt had previously taken lisinopril , but was taken off due to it affecting her kidneys.  Pt states headaches that have been ongoing for around a month and has gotten worse today.

## 2023-11-07 NOTE — ED Provider Notes (Signed)
[facility] Provider Note    Event Date/Time   First MD Initiated Contact with Patient 11/07/23 2054     (approximate)   History   Hypertension   HPI  Tabitha Lewis is a 73 y.o. female who presents to the ED for evaluation of Hypertension   Patient presents to the ED alongside her 2 children for evaluation of headache, hypertension.   Patient reports constant, every day, headaches for multiple months.  She reports no time without headaches since these started.  She saw neurology in the clinic on 9/18, and I reviewed this visit.  Outpatient MRI brain obtained without acute features.  Regarding her blood pressure for many years she had controlled BP on combo HCTZ/lisinopril  but due to renal dysfunction over the past year with GFR floating around the 40s, HCTZ component was discontinued and she now remains on lisinopril  40 mg as single antihypertensive agent.  Patient shows me a logbook of her blood pressures at home over the past month, significant variability with systolics ranging 120s-180s.  She reports today was a bad day with persistent elevated blood pressures in the 180s and a severe headache throughout the day today.  Reports this is a typical quality headache of a more severe intensity today.  No trauma, falls or syncope.  No fevers.  No weakness or sensation changes   Physical Exam   Triage Vital Signs: ED Triage Vitals  Encounter Vitals Group     BP 11/07/23 1635 (!) 211/105     Girls Systolic BP Percentile --      Girls Diastolic BP Percentile --      Boys Systolic BP Percentile --      Boys Diastolic BP Percentile --      Pulse Rate 11/07/23 1635 79     Resp 11/07/23 1635 16     Temp 11/07/23 1635 97.9 F (36.6 C)     Temp Source 11/07/23 1635 Oral     SpO2 11/07/23 1635 97 %     Weight 11/07/23 1636 170 lb (77.1 kg)     Height 11/07/23 1636 5' 5 (1.651 m)     Head Circumference --      Peak Flow --      Pain Score 11/07/23  1640 5     Pain Loc --      Pain Education --      Exclude from Growth Chart --     Most recent vital signs: Vitals:   11/07/23 2300 11/07/23 2330  BP: (!) 156/75 (!) 158/59  Pulse: 83 66  Resp: (!) 23 20  Temp:    SpO2: 100% 100%    General: Awake, no distress.  CV:  Good peripheral perfusion.  Resp:  Normal effort.  Abd:  No distention.  MSK:  No deformity noted.  Neuro:  No focal deficits appreciated. Cranial nerves II through XII intact 5/5 strength and sensation in all 4 extremities Other:     ED Results / Procedures / Treatments   Labs (all labs ordered are listed, but only abnormal results are displayed) Labs Reviewed  URINALYSIS, ROUTINE W REFLEX MICROSCOPIC - Abnormal; Notable for the following components:      Result Value   Color, Urine STRAW (*)    APPearance CLEAR (*)    Glucose, UA >=500 (*)    Ketones, ur 5 (*)    All other components within normal limits  CBC WITH DIFFERENTIAL/PLATELET  BASIC METABOLIC PANEL WITH GFR  EKG Sinus rhythm with a rate of 72 bpm.  Normal axis and intervals without for signs of acute ischemia.  RADIOLOGY CT head interpreted by me without evidence of acute intracranial pathology  Official radiology report(s): CT HEAD WO CONTRAST ( ) Result Date: 11/07/2023 CLINICAL DATA:  Hypertension, headache EXAM: CT HEAD WITHOUT CONTRAST TECHNIQUE: Contiguous axial images were obtained from the base of the skull through the vertex without intravenous contrast. RADIATION DOSE REDUCTION: This exam was performed according to the departmental dose-optimization program which includes automated exposure control, adjustment of the mA and/or kV according to patient size and/or use of iterative reconstruction technique. COMPARISON:  10/12/2023 FINDINGS: Brain: No acute infarct or hemorrhage. Stable chronic small vessel ischemic changes within the periventricular white matter. Lateral ventricles and midline structures are unremarkable. No  acute extra-axial fluid collections. No mass effect. Vascular: No hyperdense vessel or unexpected calcification. Skull: Normal. Negative for fracture or focal lesion. Sinuses/Orbits: No acute finding. Other: None. IMPRESSION: 1. No acute intracranial process. Electronically Signed   By: Ozell Daring M.D.   On: 11/07/2023 21:19    PROCEDURES and INTERVENTIONS:  Procedures  Medications  lidocaine (LIDODERM) 5 % 1 patch (1 patch Transdermal Patch Applied 11/07/23 2101)  acetaminophen  (TYLENOL ) tablet 1,000 mg (1,000 mg Oral Given 11/07/23 2101)  diphenhydrAMINE  (BENADRYL ) capsule 25 mg (25 mg Oral Given 11/07/23 2101)  prochlorperazine (COMPAZINE) tablet 10 mg (10 mg Oral Given 11/07/23 2117)  cloNIDine (CATAPRES) tablet 0.1 mg (0.1 mg Oral Given 11/07/23 2223)  hydrALAZINE (APRESOLINE) tablet 50 mg (50 mg Oral Given 11/07/23 2223)  ketorolac (TORADOL) 30 MG/ML injection 15 mg (15 mg Intravenous Given 11/07/23 2223)     IMPRESSION / MDM / ASSESSMENT AND PLAN / ED COURSE  I reviewed the triage vital signs and the nursing notes.  Differential diagnosis includes, but is not limited to, ICH, renal dysfunction, CHF, hypertensive emergency  {Patient presents with symptoms of an acute illness or injury that is potentially life-threatening.  Patient presents with hypertension and headache without evidence of endorgan damage or severe features, ultimately suitable for outpatient management with the addition of hydralazine to her regimen.  Hypertensive but looks systemically well without endorgan damage, neurologic deficits, meningeal features.  Urine without proteinuria, renal function intact with GFR.  60, normal CBC and clear CT head.  Resolution of symptoms with the above regimen, BP improving as we start hydralazine.  Considered admission for this patient.  Ultimately believe she would be suitable for outpatient management with close PCP follow-up.  Clinical Course as of 11/07/23 2340  Tue  Nov 07, 2023  2337 Reassessed, she looks much better and happier, headache is resolved, BP improved dramatically.  She is appreciative.  We discussed hydralazine added to her regimen, BP monitoring at home, management of headaches, avoidance of routine NSAIDs, ED return precautions and PCP follow-up.  Answered questions [DS]    Clinical Course User Index [DS] Claudene Rover, MD     FINAL CLINICAL IMPRESSION(S) / ED DIAGNOSES   Final diagnoses:  Bad headache  Uncontrolled hypertension     Rx / DC Orders   ED Discharge Orders          Ordered    hydrALAZINE (APRESOLINE) 50 MG tablet  3 times daily        11/07/23 2339             Note:  This document was prepared using Dragon voice recognition software and may include unintentional dictation errors.   Claudene Rover, MD  11/07/23 2341  

## 2023-11-07 NOTE — ED Notes (Signed)
 Pt's BP now 234/89; acuity level changed and pt taken to room 3 for further monitoring; report given to care nurse M Squibb RN; EKG performed

## 2023-11-07 NOTE — Telephone Encounter (Signed)
  FYI Only or Action Required?: Action required by provider: clinical question for provider and update on patient condition.  Patient was last seen in primary care on 10/03/2023 by Glendia Shad, MD.  Called Nurse Triage reporting Hypertension, Headache, and Tremors.  Symptoms began several months ago.  Interventions attempted: Prescription medications: lisinopril ; following up with neurology.  Symptoms are: tremors (primarily in hands and legs), moderate headache, hypertension (186/92 this AM and 183/95 this afternoon) gradually worsening.  Triage Disposition: See Physician Within 24 Hours  Patient/caregiver understands and will follow disposition?: Yes       Copied from CRM [id number]. Topic: Clinical - Red Word Triage >> Nov 07, 2023  3:34 PM Taleah C wrote: Red Word that prompted transfer to Nurse Triage: bp medicine changed and now bp is elevated// 178/92,  183/95, stated its been high all day long. Strong headaches & tremmers Reason for Disposition  Systolic BP >= 180 OR Diastolic >= 110  Answer Assessment - Initial Assessment Questions 1. BLOOD PRESSURE: What is your blood pressure? Did you take at least two measurements 5 minutes apart?     186/92 (this AM) and 183/95 (this afternoon). She states last week SBP was in the 160s.  2. ONSET: When did you take your blood pressure?     First BP taken before meds. 183/95 taken at 3pm today.  3. HOW: How did you take your blood pressure? (e.g., automatic home BP monitor, visiting nurse)     Automatic home BP monitor.  4. HISTORY: Do you have a history of high blood pressure?     Yes.  5. MEDICINES: Are you taking any medicines for blood pressure? Have you missed any doses recently?     Lisinopril . No missed doses. She does state there has been changes to her BP meds. She was previously on lisinopril -hydrochlorothiazide  and states BP was normally 120/70s.   6. OTHER SYMPTOMS: Do you have any symptoms?  (e.g., blurred vision, chest pain, difficulty breathing, headache, weakness)     Headache (baseline usually at 3/10 and today is 5/10) and tremors (patient reports all over, family member states mostly hands and legs) x over 1 month. She is following up with neurology for the symptoms.  Denies chest pain, SOB, unilateral numbness or weakness, changes in speech or vision.  Protocols used: Blood Pressure - High-A-AH

## 2023-11-07 NOTE — ED Notes (Signed)
 Pt ambulatory with steady gait to toilet. NADN.

## 2023-11-07 NOTE — Discharge Instructions (Signed)
 Start hydralazine (Apresoline) 3 times daily every day to help control blood pressure.  Continue lisinopril  as prescribed  Follow-up with your PCP, keep an eye on your blood pressure when you are at home  Return to the ED with any worsening symptoms despite these  measures  Use Tylenol  for pain and fevers.  Up to 1000 mg per dose, up to 4 times per day.  Do not take more than 4000 mg of Tylenol /acetaminophen  within 24 hours.Tabitha Lewis

## 2023-11-07 NOTE — Telephone Encounter (Signed)
 Spoke with pt and pt's grandson who is an EMT and he stated that they are on there way to the ED now. He stated that her last BP 198/93.

## 2023-11-07 NOTE — ED Notes (Signed)
 Pt to CT. NADN

## 2023-11-07 NOTE — ED Notes (Signed)
 Pt presented to ED with c/o hypertension. States was checking blood pressure at home and was in the 200s. Pt also endorses headache starting this morning when she woke up. States is the worst headache she has ever had. Pt states changes to blood pressure medication recently and has had issues with blood pressure since.

## 2023-11-08 ENCOUNTER — Ambulatory Visit: Payer: Self-pay

## 2023-11-08 ENCOUNTER — Other Ambulatory Visit: Payer: Self-pay | Admitting: Internal Medicine

## 2023-11-08 NOTE — Telephone Encounter (Signed)
 FYI Only or Action Required?: Action required by provider: request for appointment and update on patient condition.  Patient requests and is available for sooner appt.   Patient was last seen in primary care on 10/03/2023 by Glendia Shad, MD.  Called Nurse Triage reporting Hypertension.  Symptoms began yesterday.  Interventions attempted: Prescription medications: hydrolazine, ED visit.  Symptoms are: gradually improving.  Triage Disposition: See PCP When Office is Open (Within 3 Days)  Patient/caregiver understands and will follow disposition?: Yes  Copied from CRM [id number]. Topic: Clinical - Red Word Triage >> Nov 08, 2023 12:29 PM Dedra B wrote: Red Word that prompted transfer to Nurse Triage: Pt was seen in ER yesterday for BP of 233/110. She still has elevated BP but not as high, a headache, and shaking. Warm transfer to NT Reason for Disposition  Systolic BP >= 160 OR Diastolic >= 100  Answer Assessment - Initial Assessment Questions 1. BLOOD PRESSURE: What is your blood pressure? Did you take at least two measurements 5 minutes apart?     168/71 2. ONSET: When did you take your blood pressure?     At time of call 1237 3. HOW: How did you take your blood pressure? (e.g., automatic home BP monitor, visiting nurse)     Automatic cuff 4. HISTORY: Do you have a history of high blood pressure?     Positive for high blood pressure 5. MEDICINES: Are you taking any medicines for blood pressure? Have you missed any doses recently?     Hydralazine, TID was added by ED last night.  Taken one hour before this BP reading 6. OTHER SYMPTOMS: Do you have any symptoms? (e.g., blurred vision, chest pain, difficulty breathing, headache, weakness)     Shaking/quivering, head ache (improving),  Verbalizes understanding of ED precautions  Protocols used: Blood Pressure - High-A-AH

## 2023-11-08 NOTE — Telephone Encounter (Signed)
 Please call and confirm doing ok. Per review, she was started on hydralazine. Just started. This is a three times per day medication. Have her spot check her pressure. If any acute change or problems, needs to be evaluated sooner.

## 2023-11-09 ENCOUNTER — Ambulatory Visit (INDEPENDENT_AMBULATORY_CARE_PROVIDER_SITE_OTHER)

## 2023-11-09 ENCOUNTER — Telehealth: Payer: Self-pay | Admitting: Emergency Medicine

## 2023-11-09 ENCOUNTER — Ambulatory Visit
Admission: EM | Admit: 2023-11-09 | Discharge: 2023-11-09 | Disposition: A | Discharge status: Home / Self Care | Attending: Family Medicine | Admitting: Family Medicine

## 2023-11-09 DIAGNOSIS — I10 Essential (primary) hypertension: Secondary | ICD-10-CM | POA: Diagnosis not present

## 2023-11-09 DIAGNOSIS — M25522 Pain in left elbow: Secondary | ICD-10-CM

## 2023-11-09 DIAGNOSIS — W19XXXA Unspecified fall, initial encounter: Secondary | ICD-10-CM | POA: Diagnosis not present

## 2023-11-09 DIAGNOSIS — R519 Headache, unspecified: Secondary | ICD-10-CM | POA: Diagnosis not present

## 2023-11-09 NOTE — ED Triage Notes (Signed)
 Patient presents to the office for blood pressure check. Patient states since she started the new medication for her blood pressure she cannot stop shaking.  Patient miss her noon day dose of hydralazine.  Patient states she fell last night and her body is sore.

## 2023-11-09 NOTE — Telephone Encounter (Signed)
 Patient brought to urgent care by family for shakiness, high blood pressure, shortness of breath.  Family is very pleasant in discussion and patient appears to be in no acute distress in lobby.  Brief discussion about services available at this urgent care.  Family states her PCP instructed them to bring her to the emergency department or an urgent care.  They are reluctant to go back to the ED as patient just had a workup there 2 days ago.  Explained that some of our urgent cares offer more services, such as the urgent cares available at med centers, such as [place] [facility].  Patient's family opted to stop the registration process at that time and take the patient to our [place] urgent care location.  I notified Dr. Kriste at [facility] via epic message.

## 2023-11-09 NOTE — Telephone Encounter (Signed)
 Agree with evaluation today.

## 2023-11-09 NOTE — Discharge Instructions (Addendum)
 Your elbow x-ray was normal.  No broken bones or dislocated bones were seen. Apply warm compresses to help with pain. Take Tylenol  1000 mg every 6 hours as needed for pain.   Take your blood pressure medication when you get home. Keep a log for your primary care provider and take the log with you to your scheduled appointment next week.   Some from Dr Jamal team should reach out to your tomorrow.  If not, call in the morning to schedule an appointment.   Magnesium : Recommend beginning Magnesium  citrate or glycinate supplements 400-600 mg per day. Magnesium  can be used as preventive treatment for migraine and other types of headaches. Magnesium  citrate is used typically 400-600 mg/day. It can cause diarrhea in some patients. Magnesium  glycinate is better tolerated by GI system but it is hard to find (may have to look up online or health food store).   [facility]: Neurology.  [address]  [place], [place] [address]  [phone]

## 2023-11-09 NOTE — Telephone Encounter (Signed)
 Copied from CRM [id number]. Topic: Clinical - Red Word Triage >> Nov 08, 2023 12:29 PM Dedra B wrote: Red Word that prompted transfer to Nurse Triage: Pt was seen in ER yesterday for BP of 233/110. She still has elevated BP but not as high, a headache, and shaking. Warm transfer to NT >> Nov 09, 2023  1:41 PM Mia F wrote: Pt son Alm wanted to call to inform nurse that his mother ate yesterday afternoon. She did not eat until last night. She may not had eaten since Tuesday. He says he does not think she may have been eating properly.   Alm called to advise that patient has not eaten yesterday. He is unsure if patient has been eating properly in general. Alm says they will still go to the Urgent Care but wanted to orvided that information as well.

## 2023-11-09 NOTE — Telephone Encounter (Signed)
 Phone call to patient, Tabitha Lewis, son also present for call.  Patient complains of worsening tremors, dyspnea and weakness.  Tremors are not a new problem was seen by neurology 10/05/23.  She and son report that since Sunday the tremors have transitioned to shaking, they describe them as almost shivering, patient denies being cold or having fever.  Dyspnea is with exertion and talking.  She has started to use her walker again, had a fall yesterday denies hitting her head but does have bruises to arms and legs.  Increased weakness not able to easily ambulate around home independently.  No available appointments today in our office but with the reported symptoms we do feel that patient needs to be evaluated.  Encouraged patient to go to [facility] or [facility] for evaluation.  Patient and son verbalize understanding.

## 2023-11-09 NOTE — Telephone Encounter (Signed)
 If she is having increased sob, etc - needs to go ahead and be evaluated today.

## 2023-11-09 NOTE — ED Provider Notes (Signed)
 [facility]    CSN: [id number] Arrival date & time: 11/09/23  1440      History   Chief Complaint Chief Complaint  Patient presents with   Hypertension   Shaking    HPI Tabitha Lewis is a 73 y.o. female.   HPI   Tabitha Lewis presents for waxing and waning daily headache for the past few months. Took Tylenol  for pain but its doesn't help.  Doesn't have neurology follow up until December.  Has light sensitivity. No dizziness, confusion or vomiting. No confusion, weakness or blurry vision.   Last night, she fell while trying to answer the door to get a delivery package. Her dogs were between her feet and tripped her. She fell onto the stairs with majority impact on her left side. Has left elbow pain. Notes large bruise on her buttocks.  She put a heating pad on the area for comfort.  She did not hit her head or pass out. No vomiting occurred.  Has felt fine and per son is activity like herself.   She was at the hospital on Tuesday for headache and super high blood pressure. She went to the ED.  She was prescribed a new medication (hydralazine). She takes lisinopril  40 mg daily.  Only had 1 dose hydralazine and took her 40 mg lisinopril  today.  SBP today was in the 120s.    Her doctor was not able to see her today but they told her to go to the urgent care or ED.  She has super bad tremors that started in September. She was seen by neurologist for headache and tremor. A week ago the tremor has become prevalent.  Feels like I'm freezing cold but I'm not cold.  The headache is gone but had a  fall and shortness of breath yesterday evening.       Past Medical History:  Diagnosis Date   AKI (acute kidney injury)    03/2020-04/2020   Allergy    i.e contact allergies [facility]; also plants, trees    Chicken pox    Depression    Diabetes mellitus without complication (HCC)    DIET CONTROLLED   Family history of breast cancer    BRCA negative  in the past   Family history of breast cancer    Family history of lung cancer    Family history of rectal cancer    History of prediabetes    Hypertension    Interstitial cystitis    Interstitial cystitis    Spinal stenosis    UTI (urinary tract infection)    UTI (urinary tract infection)     Patient Active Problem List   Diagnosis Date Noted   Headache 10/03/2023   Back pain 11/29/2022   Healthcare maintenance 07/28/2022   PVC's (premature ventricular contractions) 05/19/2022   Type 2 diabetes mellitus with hyperglycemia (HCC) 03/10/2022   Chronic heart failure with preserved ejection fraction (HFpEF) (HCC) 12/31/2021   Coronary artery disease involving native coronary artery of native heart without angina pectoris 12/31/2021   Aortic atherosclerosis 12/31/2021   Hyperlipidemia LDL goal <70 12/31/2021   Rosacea 09/07/2021   Varicose veins of both lower extremities with pain 02/23/2021   Spider veins 02/23/2021   Anemia 02/23/2021   Status post total hip replacement, right 09/15/2020   Prediabetes 08/24/2020   Arthritis of right hip 08/21/2020   Arthritis of left knee 08/21/2020   Arthritis of left hip 08/21/2020   Tear of left hamstring 08/21/2020  Fatty liver 05/11/2020   AKI (acute kidney injury) 05/11/2020   Essential hypertension 03/26/2020   Abnormal MRI, lumbar spine 02/21/2020   Scoliosis of lumbar spine 02/21/2020   Lumbar facet arthropathy 02/21/2020   Cervicalgia 02/21/2020   Annual physical exam 02/21/2020   History of cataract extraction 02/21/2020   Hypokalemia 02/21/2020   Arthritis, lumbar spine 09/29/2019   Left hip pain 09/29/2019   Obesity (BMI 30-39.9) 09/25/2019   Genetic testing 08/22/2018   Family history of breast cancer    Family history of rectal cancer    Family history of lung cancer    Vitamin D  deficiency 12/26/2017   Acute cystitis without hematuria 06/19/2017   Allergic rhinitis 06/19/2017   Interstitial cystitis 12/15/2016    HTN (hypertension) 12/15/2016   Depression, major, single episode, mild 12/15/2016   Anxiety 12/15/2016   Contact dermatitis 12/15/2016   Primary osteoarthritis involving multiple joints 05/20/2015   Cervical spondylosis 02/20/2015    Past Surgical History:  Procedure Laterality Date   CATARACT EXTRACTION Bilateral    b/l Dr. 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   Procedure: TOTAL HIP ARTHROPLASTY ANTERIOR APPROACH;  Surgeon: Kathlynn Sharper, MD;  Location: [facility] ORS;  Service: Orthopedics;  Laterality: Right;    OB History   No obstetric history on file.      Home Medications    Prior to Admission medications   Medication Sig Start Date End Date Taking? Authorizing Provider  Cholecalciferol (VITAMIN D3 PO) Take 2,000 Units by mouth daily.   Yes [provider]  citalopram  (CELEXA ) 10 MG tablet TAKE 1 TABLET BY MOUTH EVERY DAY 08/13/23  Yes Glendia Shad, MD  dapagliflozin  propanediol (FARXIGA ) 10 MG TABS tablet TAKE 1 TABLET BY MOUTH DAILY BEFORE BREAKFAST. 04/11/23  Yes End, Lonni, MD  docusate sodium  (COLACE) 100 MG capsule Take 1 capsule (100 mg total) by mouth 2 (two) times daily as needed for mild constipation. 09/07/21  Yes McLean-Scocuzza, Randine SAILOR, MD  hydrALAZINE (APRESOLINE) 50 MG tablet Take 1 tablet (50 mg total) by mouth 3 (three) times daily. 11/07/23 12/07/23 Yes Claudene Rover, MD  lisinopril  (ZESTRIL ) 40 MG tablet Take 1 tablet (40 mg total) by mouth daily. 10/11/23  Yes Glendia Shad, MD  Multiple Vitamin (MULTIVITAMIN) capsule Take 1 capsule by mouth daily.   Yes [provider]  potassium chloride  (KLOR-CON  M) 10 MEQ tablet Take 1 tablet (10 mEq total) by mouth daily. 08/15/23  Yes Glendia Shad, MD  rosuvastatin  (CRESTOR ) 5 MG tablet TAKE 1 TABLET (5 MG TOTAL) BY MOUTH DAILY. 09/22/23 09/16/24 Yes End, Lonni, MD  traMADol  (ULTRAM ) 50 MG tablet Take 1 tablet (50 mg total) by mouth every 12 (twelve) hours as needed. 10/11/23  Yes  Glendia Shad, MD  traZODone  (DESYREL ) 100 MG tablet TAKE 1 TABLET BY MOUTH AT BEDTIME AS NEEDED FOR SLEEP. 11/08/23  Yes Glendia Shad, MD    Family History Family History  Problem Relation Age of Onset   Heart attack Mother 39   Breast cancer Mother 36       dx 93, 18   COPD Mother    Heart failure Mother    Leukemia Mother    Stroke Father        age 84    Breast cancer Sister 80   Other Daughter        benign brain tumor   Polycystic ovary syndrome Daughter    Breast cancer Maternal Aunt  dx >50   Lung cancer Maternal Uncle    Breast cancer Maternal Grandmother    Breast cancer Cousin        mat cousins x4   Stroke Other        grandmother age 57    Lupus Other     Social History Social History   Tobacco Use   Smoking status: Former    Current packs/day: 0.00    Average packs/day: 1 pack/day for 7.0 years (7.0 ttl pk-yrs)    Types: Cigarettes    Start date: 1966    Quit date: 73    Years since quitting: 52.8   Smokeless tobacco: Never  Vaping Use   Vaping status: Never Used  Substance Use Topics   Alcohol use: No   Drug use: No     Allergies   Amlodipine , Nsaids, Ozempic (0.25 or 0.5 mg-dose) [semaglutide (0.25 or 0.5mg -dos)], Benzalkonium chloride, Cetyl alcohol, Codeine, Lanolin, Neomycin, Nickel, and Propylene glycol   Review of Systems Review of Systems: negative unless otherwise stated in HPI.      Physical Exam Triage Vital Signs ED Triage Vitals  Encounter Vitals Group     BP 11/09/23 1538 (!) 165/81     Girls Systolic BP Percentile --      Girls Diastolic BP Percentile --      Boys Systolic BP Percentile --      Boys Diastolic BP Percentile --      Pulse Rate 11/09/23 1538 88     Resp 11/09/23 1538 18     Temp 11/09/23 1538 98.9 F (37.2 C)     Temp Source 11/09/23 1538 Oral     SpO2 11/09/23 1538 95 %     Weight --      Height --      Head Circumference --      Peak Flow --      Pain Score 11/09/23 1545 0     Pain  Loc --      Pain Education --      Exclude from Growth Chart --    No data found.  Updated Vital Signs BP (!) 180/87   Pulse 80   Temp 98.9 F (37.2 C) (Oral)   Resp 18   SpO2 94%   Visual Acuity Right Eye Distance:   Left Eye Distance:   Bilateral Distance:    Right Eye Near:   Left Eye Near:    Bilateral Near:     Physical Exam GEN:     alert, cooperative non-toxic appearing female and no distress    HENT:  mucus membranes moist, oropharyngeal without lesions or erythema,  nares patent, no nasal discharge  EYES:   pupils equal and reactive, EOM intact NECK:  supple, good ROM RESP:  clear to auscultation bilaterally, no increased work of breathing  CVS:   regular rate and rhythm EXT:  Good  ROM of bilateral upper and lower extremities, no epicondyle tenderness bilaterally, olecranon tenderness to palpation, no elbow edema present but there is adjacent ecchymosis from her recent fall NEURO:  alert, oriented, speech normal, CN 2-12 grossly intact, no facial droop,  sensation grossly intact, strength 5/5 bilateral UE and LE, normal coordination, normal finger to nose Skin:   warm and dry, scattered ecchymosis on upper and lower extremities as well as buttocks post fall  Psych: Normal affect, appropriate speech and behavior      UC Treatments / Results  Labs (all labs ordered are listed, but only  abnormal results are displayed) Labs Reviewed - No data to display  EKG  If EKG performed, see my interpretation in the MDM section  Radiology Reviewed:  CT HEAD WO CONTRAST ( ) Result Date: 11/07/2023 CLINICAL DATA:  Hypertension, headache EXAM: CT HEAD WITHOUT CONTRAST TECHNIQUE: Contiguous axial images were obtained from the base of the skull through the vertex without intravenous contrast. RADIATION DOSE REDUCTION: This exam was performed according to the departmental dose-optimization program which includes automated exposure control, adjustment of the mA and/or kV  according to patient size and/or use of iterative reconstruction technique. COMPARISON:  10/12/2023 FINDINGS: Brain: No acute infarct or hemorrhage. Stable chronic small vessel ischemic changes within the periventricular white matter. Lateral ventricles and midline structures are unremarkable. No acute extra-axial fluid collections. No mass effect. Vascular: No hyperdense vessel or unexpected calcification. Skull: Normal. Negative for fracture or focal lesion. Sinuses/Orbits: No acute finding. Other: None. IMPRESSION: 1. No acute intracranial process. Electronically Signed   By: Ozell Daring M.D.   On: 11/07/2023 21:19    DG Elbow Complete Left Result Date: 11/09/2023 CLINICAL DATA:  Fall with bruising and pain EXAM: LEFT ELBOW - COMPLETE 3+ VIEW COMPARISON:  None Available. FINDINGS: There is no evidence of fracture, dislocation, or joint effusion. There is no evidence of arthropathy or other focal bone abnormality. Soft tissues are unremarkable. IMPRESSION: Negative. Electronically Signed   By: Luke Bun M.D.   On: 11/09/2023 17:19    Procedures Procedures (including critical care time)  Medications Ordered in UC Medications - No data to display  Initial Impression / Assessment and Plan / UC Course  I have reviewed the triage vital signs and the nursing notes.  Pertinent labs & imaging results that were available during my care of the patient were reviewed by me and considered in my medical decision making (see chart for details).       Patient is a 73 y.o. female  who presents for worsening daily persistent headache, elevated blood pressure and a fall last night.  Overall patient is nontoxic-appearing and afebrile.    She is hypertensive here at 165/81.  Repeat blood pressure about an hour later was 180/87.  Son reports that it is past time for Tabitha Lewis to take her second dose of hydralazine.  She took her 40 mg dose of lisinopril  already.  Reports home blood pressures  systolically in the 120s.  Recommended follow-up with her primary care provider regarding her blood pressures.  Reviewed recent EKG from 2 days ago which showed no acute ST or T wave changes.  Suspect hypertension today is related to missed medication dose.  Advised patient to take this medication when she gets home.   Recent fall: On exam, she has olecranon tenderness with good range of motion.  Recommended elbow x-ray which she is agreeable.  Left elbow plain films personally viewed by me showing no fracture or dislocation.  Radiologist impression reviewed.  She did not have a head strike.  No repeat head imaging required today.  Persistent daily headaches: Neurological exam today is unremarkable.  CT head from 11/07/2023 was normal.  Reviewed ED notes and labs from [facility] on 11/07/2023.  Was seen by Dr. Loreli, neurologist at [facility] on 10/03/2023 for initial visit.  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  No masses or intracranial hemorrhage was identified.  Reviewed this result with the  patient and her son.  On review of Dr. Jamal note, patient was advised to start magnesium .  She has not started this yet.  Recommended to patient and reminded son that they should start this.  This was for also provided in her after visit summary.  I called Dr. Orlando office and spoke with the after-hours provider Ms. Lauraine Rocks who reports that Dr. Orlando team will call Tabitha Lewis tomorrow to schedule a follow-up appointment sooner than December.  Patient was also given this contact information and she will call Dr. Orlando office herself by 10 AM tomorrow.    ED and return precautions given and patient/guardian voiced understanding. Discussed MDM, treatment plan and plan for follow-up with patient and her son who agree with plan.   Final Clinical Impressions(s) / UC Diagnoses    Final diagnoses:  Left elbow pain  Persistent headaches  Fall, initial encounter  Elevated blood pressure reading with diagnosis of hypertension     Discharge Instructions      Your elbow x-ray was normal.  No broken bones or dislocated bones were seen. Apply warm compresses to help with pain. Take Tylenol  1000 mg every 6 hours as needed for pain.   Take your blood pressure medication when you get home. Keep a log for your primary care provider and take the log with you to your scheduled appointment next week.   Some from Dr Jamal team should reach out to your tomorrow.  If not, call in the morning to schedule an appointment.   Magnesium : Recommend beginning Magnesium  citrate or glycinate supplements 400-600 mg per day. Magnesium  can be used as preventive treatment for migraine and other types of headaches. Magnesium  citrate is used typically 400-600 mg/day. It can cause diarrhea in some patients. Magnesium  glycinate is better tolerated by GI system but it is hard to find (may have to look up online or health food store).   [facility]: Neurology.  [address]  [place], [place] [address]  [phone]      ED Prescriptions   None    PDMP not reviewed this encounter.   Kriste Berth, DO 11/09/23 2030

## 2023-11-10 NOTE — Telephone Encounter (Signed)
 Was evaluated at [facility]. They contacted on call provider for Dr Maree. Apparently going to contact her with an earlier appt. I can work her in 11/15/23 - 12:00.

## 2023-11-10 NOTE — Telephone Encounter (Signed)
 Phone call to patient. She is aware of appointment 11/15/23 @ 12p.  Patient reported that UC provider was able to get her a sooner appointment with Neuro.  She has an appointment 11/14/23 at 11am.

## 2023-11-13 ENCOUNTER — Inpatient Hospital Stay: Admitting: Internal Medicine

## 2023-11-14 DIAGNOSIS — R251 Tremor, unspecified: Secondary | ICD-10-CM | POA: Diagnosis not present

## 2023-11-14 DIAGNOSIS — G939 Disorder of brain, unspecified: Secondary | ICD-10-CM | POA: Diagnosis not present

## 2023-11-14 DIAGNOSIS — Z1331 Encounter for screening for depression: Secondary | ICD-10-CM | POA: Diagnosis not present

## 2023-11-14 DIAGNOSIS — I10 Essential (primary) hypertension: Secondary | ICD-10-CM | POA: Diagnosis not present

## 2023-11-14 DIAGNOSIS — R519 Headache, unspecified: Secondary | ICD-10-CM | POA: Diagnosis not present

## 2023-11-15 ENCOUNTER — Ambulatory Visit: Admitting: Internal Medicine

## 2023-11-15 ENCOUNTER — Encounter: Payer: Self-pay | Admitting: Internal Medicine

## 2023-11-15 VITALS — BP 130/62 | HR 81 | Temp 98.2°F | Ht 65.0 in | Wt 177.0 lb

## 2023-11-15 DIAGNOSIS — E785 Hyperlipidemia, unspecified: Secondary | ICD-10-CM

## 2023-11-15 DIAGNOSIS — I2510 Atherosclerotic heart disease of native coronary artery without angina pectoris: Secondary | ICD-10-CM | POA: Diagnosis not present

## 2023-11-15 DIAGNOSIS — E1165 Type 2 diabetes mellitus with hyperglycemia: Secondary | ICD-10-CM | POA: Diagnosis not present

## 2023-11-15 DIAGNOSIS — E876 Hypokalemia: Secondary | ICD-10-CM | POA: Diagnosis not present

## 2023-11-15 DIAGNOSIS — F419 Anxiety disorder, unspecified: Secondary | ICD-10-CM | POA: Diagnosis not present

## 2023-11-15 DIAGNOSIS — I5032 Chronic diastolic (congestive) heart failure: Secondary | ICD-10-CM | POA: Diagnosis not present

## 2023-11-15 DIAGNOSIS — I10 Essential (primary) hypertension: Secondary | ICD-10-CM | POA: Diagnosis not present

## 2023-11-15 DIAGNOSIS — R519 Headache, unspecified: Secondary | ICD-10-CM | POA: Diagnosis not present

## 2023-11-15 DIAGNOSIS — R944 Abnormal results of kidney function studies: Secondary | ICD-10-CM

## 2023-11-15 MED ORDER — HYDROCHLOROTHIAZIDE 12.5 MG PO CAPS: 12.5000 mg | ORAL_CAPSULE | Freq: Every day | ORAL | 2 refills | Status: DC

## 2023-11-15 NOTE — Patient Instructions (Signed)
 Continue lisinopril  40mg  - take one per day  Start hydrochlorothiazide  12.5mg  - one capsule per day  Decrease hydralazine to 50mg  - 1/2 tablet three times per day.

## 2023-11-15 NOTE — Progress Notes (Signed)
 Subjective:    Patient ID: Tabitha Lewis, female    DOB: 1950/07/05, 73 y.o.   MRN: [id number]  Patient here for  Chief Complaint  Patient presents with   Follow-up    ED f/u for HTN-seen on 10/21 & 10/23    HPI Here for ED follow up. Recently evaluated by neurology 10/05/23 - headache. Started magnesium . 10/13/23 - MRI breain- no acute intracranial abnormality. Moderate chronic small vessel ischemic disease. 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 Seen ED - 11/07/23 - headache and elevated blood pressure. CT head - no acute abnormality. Started on hydralazine. Sen UC 11/09/23 - s/p fall the night before. Tripped over her dogs. Fell onto the stairs. Left elbow pain. No head injury. Left elbow xray - no fracture or dislocation. Had f/u with neurology yesterday. Referred to nephrology for blood pressure elevated. Prescribed qulitpa. Has not started. Prefers not to start right now. Concern regarding persistent increased blood pressure. Reports prior to her blood pressure change - stopping the hydrochlorothiazide , she was feeling good. Very active. She is accompanied by her son. History obtained from both of them. No chest pain. No increased dizziness. May notice some sob with increased exertion. Headache is better.    Past Medical History:  Diagnosis Date   AKI (acute kidney injury)    03/2020-04/2020   Allergy    i.e contact allergies [facility]; also plants, trees    Chicken pox    Depression    Diabetes mellitus without complication (HCC)    DIET CONTROLLED   Family history of breast cancer    BRCA negative in the past   Family history of breast cancer    Family history of lung cancer    Family history of rectal cancer    History of prediabetes    Hypertension    Interstitial cystitis    Interstitial cystitis    Spinal stenosis    UTI (urinary tract infection)    UTI (urinary tract infection)    Past Surgical History:   Procedure Laterality Date   CATARACT EXTRACTION Bilateral    b/l Dr. 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   Procedure: TOTAL HIP ARTHROPLASTY ANTERIOR APPROACH;  Surgeon: Kathlynn Sharper, MD;  Location: [facility] ORS;  Service: Orthopedics;  Laterality: Right;   Family History  Problem Relation Age of Onset   Heart attack Mother 47   Breast cancer Mother 37       dx 50, 45   COPD Mother    Heart failure Mother    Leukemia Mother    Stroke Father        age 63    Breast cancer Sister 28   Other Daughter        benign brain tumor   Polycystic ovary syndrome Daughter    Breast cancer Maternal Aunt        dx >50   Lung cancer Maternal Uncle    Breast cancer Maternal Grandmother    Breast cancer Cousin        mat cousins x4   Stroke Other        grandmother age 24    Lupus Other    Social History   Socioeconomic History   Marital status: Widowed    Spouse name: Not on file   Number of children: 2   Years of education: Not on file   Highest education level: Not on file  Occupational History  Not on file  Tobacco Use   Smoking status: Former    Current packs/day: 0.00    Average packs/day: 1 pack/day for 7.0 years (7.0 ttl pk-yrs)    Types: Cigarettes    Start date: 51    Quit date: 60    Years since quitting: 52.8   Smokeless tobacco: Never  Vaping Use   Vaping status: Never Used  Substance and Sexual Activity   Alcohol use: No   Drug use: No   Sexual activity: Not on file  Other Topics Concern   Not on file  Social History Narrative   1 daughter and 1 son    3 grandsons    Married to high school sweet heart (husband) x 47 years dx'ed with prostate cancer in 25-Dec-2015 he died 2017-06-23    Retired Optometrist education [employer]   Social Drivers of Health   Financial Resource Strain: Low Risk  (10/05/2023)   Received from [facility]   Overall Financial Resource Strain (CARDIA)    Difficulty of Paying Living Expenses: Not  hard at all  Food Insecurity: No Food Insecurity (10/05/2023)   Received from [facility]   Hunger Vital Sign    Within the past 12 months, you worried that your food would run out before you got the money to buy more.: Never true    Within the past 12 months, the food you bought just didn't last and you didn't have money to get more.: Never true  Transportation Needs: No Transportation Needs (10/05/2023)   Received from [facility] - Transportation    In the past 12 months, has lack of transportation kept you from medical appointments or from getting medications?: No    Lack of Transportation (Non-Medical): No  Physical Activity: Sufficiently Active (10/19/2022)   Exercise Vital Sign    Days of Exercise per Week: 3 days    Minutes of Exercise per Session: 60 min  Stress: No Stress Concern Present (10/19/2022)   [employer] - Occupational Stress Questionnaire    Feeling of Stress : Only a little  Social Connections: Socially Isolated (10/19/2022)   Social Connection and Isolation Panel    Frequency of Communication with Friends and Family: More than three times a week    Frequency of Social Gatherings with Friends and Family: Twice a week    Attends Religious Services: Never    Database Administrator or Organizations: No    Attends Banker Meetings: Never    Marital Status: Widowed     Review of Systems  Constitutional:  Negative for appetite change and unexpected weight change.  HENT:  Negative for congestion and sinus pressure.   Respiratory:  Negative for cough and chest tightness.        Some minimal sob with exertion.   Cardiovascular:  Negative for chest pain, palpitations and leg swelling.  Gastrointestinal:  Negative for abdominal pain, diarrhea, nausea and vomiting.  Genitourinary:  Negative for difficulty urinating and dysuria.  Musculoskeletal:  Negative for joint swelling and myalgias.   Skin:  Negative for color change and rash.  Neurological:  Negative for dizziness.       Headache has improved.   Psychiatric/Behavioral:  Negative for agitation and dysphoric mood.        Objective:     BP 130/62   Pulse 81   Temp 98.2 F (36.8 C) (Oral)  Ht 5' 5 (1.651 m)   Wt 177 lb (80.3 kg)   SpO2 97%   BMI 29.45 kg/m  Wt Readings from Last 3 Encounters:  11/15/23 177 lb (80.3 kg)  11/07/23 170 lb (77.1 kg)  10/03/23 172 lb 9.6 oz (78.3 kg)    Physical Exam Vitals reviewed.  Constitutional:      General: She is not in acute distress.    Appearance: Normal appearance.  HENT:     Head: Normocephalic and atraumatic.     Right Ear: External ear normal.     Left Ear: External ear normal.     Mouth/Throat:     Pharynx: No oropharyngeal exudate or posterior oropharyngeal erythema.  Eyes:     General: No scleral icterus.       Right eye: No discharge.        Left eye: No discharge.     Conjunctiva/sclera: Conjunctivae normal.  Neck:     Thyroid : No thyromegaly.  Cardiovascular:     Rate and Rhythm: Normal rate and regular rhythm.  Pulmonary:     Effort: No respiratory distress.     Breath sounds: Normal breath sounds. No wheezing.  Abdominal:     General: Bowel sounds are normal.     Palpations: Abdomen is soft.     Tenderness: There is no abdominal tenderness.  Musculoskeletal:        General: No swelling or tenderness.     Cervical back: Neck supple. No tenderness.  Lymphadenopathy:     Cervical: No cervical adenopathy.  Skin:    Findings: No erythema or rash.  Neurological:     Mental Status: She is alert.  Psychiatric:        Mood and Affect: Mood normal.        Behavior: Behavior normal.         Outpatient Encounter Medications as of 11/15/2023  Medication Sig   Cholecalciferol (VITAMIN D3 PO) Take 2,000 Units by mouth daily.   citalopram  (CELEXA ) 10 MG tablet TAKE 1 TABLET BY MOUTH EVERY DAY   dapagliflozin  propanediol (FARXIGA ) 10 MG  TABS tablet TAKE 1 TABLET BY MOUTH DAILY BEFORE BREAKFAST.   docusate sodium  (COLACE) 100 MG capsule Take 1 capsule (100 mg total) by mouth 2 (two) times daily as needed for mild constipation.   hydrALAZINE (APRESOLINE) 50 MG tablet Take 1 tablet (50 mg total) by mouth 3 (three) times daily.   hydrochlorothiazide  (MICROZIDE ) 12.5 MG capsule Take 1 capsule (12.5 mg total) by mouth daily.   lisinopril  (ZESTRIL ) 40 MG tablet Take 1 tablet (40 mg total) by mouth daily.   Magnesium  400 MG TABS Take by mouth.   Multiple Vitamin (MULTIVITAMIN) capsule Take 1 capsule by mouth daily.   potassium chloride  (KLOR-CON  M) 10 MEQ tablet Take 1 tablet (10 mEq total) by mouth daily.   rosuvastatin  (CRESTOR ) 5 MG tablet TAKE 1 TABLET (5 MG TOTAL) BY MOUTH DAILY.   traMADol  (ULTRAM ) 50 MG tablet Take 1 tablet (50 mg total) by mouth every 12 (twelve) hours as needed.   traZODone  (DESYREL ) 100 MG tablet TAKE 1 TABLET BY MOUTH AT BEDTIME AS NEEDED FOR SLEEP.   No facility-administered encounter medications on file as of 11/15/2023.     Lab Results  Component Value Date   WBC 6.4 11/07/2023   HGB 12.9 11/07/2023   HCT 39.9 11/07/2023   PLT 221 11/07/2023   GLUCOSE 93 11/07/2023   CHOL 139 08/11/2023   TRIG 63.0 08/11/2023   HDL  72.50 08/11/2023   LDLCALC 53 08/11/2023   ALT 17 08/11/2023   AST 15 08/11/2023   NA 140 11/07/2023   K 3.6 11/07/2023   CL 104 11/07/2023   CREATININE 0.93 11/07/2023   BUN 15 11/07/2023   CO2 26 11/07/2023   TSH 3.08 03/27/2023   HGBA1C 6.4 08/11/2023   MICROALBUR 4.0 (H) 08/11/2023    DG Elbow Complete Left Result Date: 11/09/2023 CLINICAL DATA:  Fall with bruising and pain EXAM: LEFT ELBOW - COMPLETE 3+ VIEW COMPARISON:  None Available. FINDINGS: There is no evidence of fracture, dislocation, or joint effusion. There is no evidence of arthropathy or other focal bone abnormality. Soft tissues are unremarkable. IMPRESSION: Negative. Electronically Signed   By: Luke Bun M.D.   On: 11/09/2023 17:19       Assessment & Plan:  Primary hypertension Assessment & Plan:  I saw her 08/15/23 - and lisinopril /hydrochlorothiazide  was changed to lisinopril  20mg  q day due to decrease in GFR. With persistent elevation in blood pressure, lisinopril  was increased to 40mg  q day. Did not tolerate amlodipine . Persistent elevation recently. Currently on hydralazine 50mg  tid now. Blood doing better. Still varying. Increased swelling. Felt better on hydrochlorothiazide . Restart hydrochlorothiazide  12.5mg  q day. Stay hydrated. Follow pressures. Get her back in soon to reassess. Follow metabolic panel closely.    Decreased GFR  Type 2 diabetes mellitus with hyperglycemia, without long-term current use of insulin (HCC) Assessment & Plan: Continue farxiga . Low carb diet and exercise. Follow met b and A1c.  No changes today.  Lab Results  Component Value Date   HGBA1C 6.4 08/11/2023      Hypokalemia Assessment & Plan: Off potassium now since off hydrochlorothiazide . Remain off for now, since on increased lisinopril . Will recheck met b soon. Replace if needed.    Hyperlipidemia LDL goal <70 Assessment & Plan: Continue crestor . Low cholesterol diet and exercise.  Follow lipid panel and liver function tests.  No change in medication today.  Lab Results  Component Value Date   CHOL 139 08/11/2023   HDL 72.50 08/11/2023   LDLCALC 53 08/11/2023   TRIG 63.0 08/11/2023   CHOLHDL 2 08/11/2023      Anxiety Assessment & Plan: Continues on celexa . Increased stress with recent blood pressure issues. Treat blood pressure as outlined. Follow. Hold on making any changes. Follow.    Chronic heart failure with preserved ejection fraction (HFpEF) (HCC) Assessment & Plan: NYHA class II HFpEF. Recent echocardiogram showed LVEF of 55-60% with grade 2 diastolic dysfunction. Followed by Dr End.  Continue farxiga  and lisinopril . Restart hydrochlorothiazide . Adjust dosing of  hydralazine as outlined.  Follow.    Coronary artery disease involving native coronary artery of native heart without angina pectoris Assessment & Plan: Followed by Dr End. CTA as outlined. Continue farxiga  and crestor . Some minimal sob with exertion. Discussed further w/up. Will get blood pressure controlled - see if symptoms resolve. Follow.    Essential hypertension Assessment & Plan: Blood pressure as outlined. Still with intermittent elevation and headache. Continue lisinopril  40mg  q day. Add back hydrochlorothiazide  12.5mg  q day. Decrease hydralazine to 25mg  tid. Follow pressures. Follow metabolic panel closely. Stay hydrated.    Nonintractable headache, unspecified chronicity pattern, unspecified headache type Assessment & Plan: Scans as outlined. Has seen neurology. Treat blood pressure. Follow. Hold on further w/up at this time.    Other orders -     hydroCHLOROthiazide ; Take 1 capsule (12.5 mg total) by mouth daily.  Dispense: 30 capsule; Refill: 2  Allena Hamilton, MD

## 2023-11-19 ENCOUNTER — Encounter: Payer: Self-pay | Admitting: Internal Medicine

## 2023-11-19 NOTE — Assessment & Plan Note (Signed)
 Scans as outlined. Has seen neurology. Treat blood pressure. Follow. Hold on further w/up at this time.

## 2023-11-19 NOTE — Assessment & Plan Note (Signed)
 NYHA class II HFpEF. Recent echocardiogram showed LVEF of 55-60% with grade 2 diastolic dysfunction. Followed by Dr End.  Continue farxiga  and lisinopril . Restart hydrochlorothiazide . Adjust dosing of hydralazine as outlined.  Follow.

## 2023-11-19 NOTE — Assessment & Plan Note (Signed)
 I saw her 08/15/23 - and lisinopril /hydrochlorothiazide  was changed to lisinopril  20mg  q day due to decrease in GFR. With persistent elevation in blood pressure, lisinopril  was increased to 40mg  q day. Did not tolerate amlodipine . Persistent elevation recently. Currently on hydralazine 50mg  tid now. Blood doing better. Still varying. Increased swelling. Felt better on hydrochlorothiazide . Restart hydrochlorothiazide  12.5mg  q day. Stay hydrated. Follow pressures. Get her back in soon to reassess. Follow metabolic panel closely.

## 2023-11-19 NOTE — Assessment & Plan Note (Signed)
 Continue farxiga . Low carb diet and exercise. Follow met b and A1c.  No changes today.  Lab Results  Component Value Date   HGBA1C 6.4 08/11/2023

## 2023-11-19 NOTE — Assessment & Plan Note (Signed)
 Followed by Dr End. CTA as outlined. Continue farxiga  and crestor . Some minimal sob with exertion. Discussed further w/up. Will get blood pressure controlled - see if symptoms resolve. Follow.

## 2023-11-19 NOTE — Assessment & Plan Note (Signed)
 Continue crestor . Low cholesterol diet and exercise.  Follow lipid panel and liver function tests.  No change in medication today.  Lab Results  Component Value Date   CHOL 139 08/11/2023   HDL 72.50 08/11/2023   LDLCALC 53 08/11/2023   TRIG 63.0 08/11/2023   CHOLHDL 2 08/11/2023

## 2023-11-19 NOTE — Assessment & Plan Note (Signed)
 Blood pressure as outlined. Still with intermittent elevation and headache. Continue lisinopril  40mg  q day. Add back hydrochlorothiazide  12.5mg  q day. Decrease hydralazine to 25mg  tid. Follow pressures. Follow metabolic panel closely. Stay hydrated.

## 2023-11-19 NOTE — Assessment & Plan Note (Signed)
 Off potassium now since off hydrochlorothiazide . Remain off for now, since on increased lisinopril . Will recheck met b soon. Replace if needed.

## 2023-11-19 NOTE — Assessment & Plan Note (Signed)
 Continues on celexa . Increased stress with recent blood pressure issues. Treat blood pressure as outlined. Follow. Hold on making any changes. Follow.

## 2023-11-27 ENCOUNTER — Encounter: Payer: Self-pay | Admitting: Internal Medicine

## 2023-12-06 ENCOUNTER — Other Ambulatory Visit: Payer: Self-pay | Admitting: Internal Medicine

## 2023-12-06 ENCOUNTER — Ambulatory Visit: Admitting: Internal Medicine

## 2023-12-06 VITALS — BP 110/60 | HR 79 | Temp 98.1°F | Ht 65.0 in | Wt 174.1 lb

## 2023-12-06 DIAGNOSIS — E1165 Type 2 diabetes mellitus with hyperglycemia: Secondary | ICD-10-CM

## 2023-12-06 DIAGNOSIS — I5032 Chronic diastolic (congestive) heart failure: Secondary | ICD-10-CM | POA: Diagnosis not present

## 2023-12-06 DIAGNOSIS — E2839 Other primary ovarian failure: Secondary | ICD-10-CM | POA: Diagnosis not present

## 2023-12-06 DIAGNOSIS — E785 Hyperlipidemia, unspecified: Secondary | ICD-10-CM | POA: Diagnosis not present

## 2023-12-06 DIAGNOSIS — D649 Anemia, unspecified: Secondary | ICD-10-CM | POA: Diagnosis not present

## 2023-12-06 DIAGNOSIS — I2510 Atherosclerotic heart disease of native coronary artery without angina pectoris: Secondary | ICD-10-CM | POA: Diagnosis not present

## 2023-12-06 DIAGNOSIS — F320 Major depressive disorder, single episode, mild: Secondary | ICD-10-CM | POA: Diagnosis not present

## 2023-12-06 DIAGNOSIS — R519 Headache, unspecified: Secondary | ICD-10-CM | POA: Diagnosis not present

## 2023-12-06 DIAGNOSIS — Z13820 Encounter for screening for osteoporosis: Secondary | ICD-10-CM

## 2023-12-06 DIAGNOSIS — Z7984 Long term (current) use of oral hypoglycemic drugs: Secondary | ICD-10-CM

## 2023-12-06 DIAGNOSIS — Z1231 Encounter for screening mammogram for malignant neoplasm of breast: Secondary | ICD-10-CM

## 2023-12-06 DIAGNOSIS — I10 Essential (primary) hypertension: Secondary | ICD-10-CM

## 2023-12-06 LAB — HEPATIC FUNCTION PANEL
ALT: 16 U/L (ref 0–35)
AST: 17 U/L (ref 0–37)
Albumin: 4.3 g/dL (ref 3.5–5.2)
Alkaline Phosphatase: 69 U/L (ref 39–117)
Bilirubin, Direct: 0.1 mg/dL (ref 0.0–0.3)
Total Bilirubin: 0.6 mg/dL (ref 0.2–1.2)
Total Protein: 6.8 g/dL (ref 6.0–8.3)

## 2023-12-06 LAB — BASIC METABOLIC PANEL WITH GFR
BUN: 23 mg/dL (ref 6–23)
CO2: 30 meq/L (ref 19–32)
Calcium: 9.4 mg/dL (ref 8.4–10.5)
Chloride: 100 meq/L (ref 96–112)
Creatinine, Ser: 1.26 mg/dL — ABNORMAL HIGH (ref 0.40–1.20)
GFR: 41.79 mL/min — ABNORMAL LOW (ref >60.00)
Glucose, Bld: 110 mg/dL — ABNORMAL HIGH (ref 70–99)
Potassium: 4.6 meq/L (ref 3.5–5.1)
Sodium: 138 meq/L (ref 135–145)

## 2023-12-06 LAB — LIPID PANEL
Cholesterol: 129 mg/dL (ref 0–200)
HDL: 60.8 mg/dL (ref >39.00)
LDL Cholesterol: 51 mg/dL (ref 0–99)
NonHDL: 67.88
Total CHOL/HDL Ratio: 2
Triglycerides: 85 mg/dL (ref 0.0–149.0)
VLDL: 17 mg/dL (ref 0.0–40.0)

## 2023-12-06 LAB — HM DIABETES FOOT EXAM

## 2023-12-06 MED ORDER — CITALOPRAM HYDROBROMIDE 10 MG PO TABS: 10.0000 mg | ORAL_TABLET | Freq: Every day | ORAL | 1 refills | Status: DC

## 2023-12-06 MED ORDER — LISINOPRIL 40 MG PO TABS: 40.0000 mg | ORAL_TABLET | Freq: Every day | ORAL | 1 refills | Status: DC

## 2023-12-06 NOTE — Progress Notes (Signed)
 Subjective:    Patient ID: Tabitha Lewis, female    DOB: 04-17-1950, 73 y.o.   MRN: [id number]  Patient here for  Chief Complaint  Patient presents with   Medical Management of Chronic Issues    Follow-up    HPI Here for a scheduled follow up - follow up regarding her blood pressure and previous headaches. Recently evaluated by neurology 10/05/23 - headache. Started magnesium . 10/13/23 - MRI breain- no acute intracranial abnormality. Moderate chronic small vessel ischemic disease. 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 Seen ED - 11/07/23 - headache and elevated blood pressure. CT head - no acute abnormality. Started on hydralazine . Sen UC 11/09/23 - s/p fall the night before. Tripped over her dogs. Fell onto the stairs. Left elbow pain. No head injury. Left elbow xray - no fracture or dislocation. Had f/u with neurology 11/14/23 - recommended to start qulipta. Elected to hold on starting. Last visit, restarted hydrochlorothiazide  12.5mg  q day. Recommended to continue on lisinopril  and hydralazine . Reviewed outside blood pressures. Blood pressure - improved - most averaging 119-120s/70s. Feels better. Has decreased her hydralazine . Only taking 1/2 tablet now. Discussed stopping. Continues on farxiga . Continues on citalopram . Breathing better. No increased sob. No chest pain.   Release labs.    Past Medical History:  Diagnosis Date   AKI (acute kidney injury)    03/2020-04/2020   Allergy    i.e contact allergies [facility] Douglass Mulligan; also plants, trees    Chicken pox    Depression    Diabetes mellitus without complication (HCC)    DIET CONTROLLED   Family history of breast cancer    BRCA negative in the past   Family history of breast cancer    Family history of lung cancer    Family history of rectal cancer    History of prediabetes    Hypertension    Interstitial cystitis    Interstitial cystitis    Spinal stenosis    UTI (urinary  tract infection)    UTI (urinary tract infection)    Past Surgical History:  Procedure Laterality Date   CATARACT EXTRACTION Bilateral    b/l Dr. 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   Procedure: TOTAL HIP ARTHROPLASTY ANTERIOR APPROACH;  Surgeon: Kathlynn Sharper, MD;  Location: [facility] ORS;  Service: Orthopedics;  Laterality: Right;   Family History  Problem Relation Age of Onset   Heart attack Mother 24   Breast cancer Mother 63       dx 52, 54   COPD Mother    Heart failure Mother    Leukemia Mother    Stroke Father        age 15    Breast cancer Sister 64   Other Daughter        benign brain tumor   Polycystic ovary syndrome Daughter    Breast cancer Maternal Aunt        dx >50   Lung cancer Maternal Uncle    Breast cancer Maternal Grandmother    Breast cancer Cousin        mat cousins x4   Stroke Other        grandmother age 18    Lupus Other    Social History   Socioeconomic History   Marital status: Widowed    Spouse name: Not on file   Number of children: 2   Years of education: Not on file   Highest education level: Not  on file  Occupational History   Not on file  Tobacco Use   Smoking status: Former    Current packs/day: 0.00    Average packs/day: 1 pack/day for 7.0 years (7.0 ttl pk-yrs)    Types: Cigarettes    Start date: 73    Quit date: 74    Years since quitting: 52.9   Smokeless tobacco: Never  Vaping Use   Vaping status: Never Used  Substance and Sexual Activity   Alcohol use: No   Drug use: No   Sexual activity: Not on file  Other Topics Concern   Not on file  Social History Narrative   1 daughter and 1 son    3 grandsons    Married to high school sweet heart (husband) x 47 years dx'ed with prostate cancer in 12-30-2015 he died 2017/05/29    Retired Optometrist education [employer]   Social Drivers of Health   Financial Resource Strain: Low Risk  (10/05/2023)   Received from [facility]   Overall  Financial Resource Strain (CARDIA)    Difficulty of Paying Living Expenses: Not hard at all  Food Insecurity: No Food Insecurity (10/05/2023)   Received from [facility]   Hunger Vital Sign    Within the past 12 months, you worried that your food would run out before you got the money to buy more.: Never true    Within the past 12 months, the food you bought just didn't last and you didn't have money to get more.: Never true  Transportation Needs: No Transportation Needs (10/05/2023)   Received from [facility] - Transportation    In the past 12 months, has lack of transportation kept you from medical appointments or from getting medications?: No    Lack of Transportation (Non-Medical): No  Physical Activity: Sufficiently Active (10/19/2022)   Exercise Vital Sign    Days of Exercise per Week: 3 days    Minutes of Exercise per Session: 60 min  Stress: No Stress Concern Present (10/19/2022)   [employer] - Occupational Stress Questionnaire    Feeling of Stress : Only a little  Social Connections: Socially Isolated (10/19/2022)   Social Connection and Isolation Panel    Frequency of Communication with Friends and Family: More than three times a week    Frequency of Social Gatherings with Friends and Family: Twice a week    Attends Religious Services: Never    Database Administrator or Organizations: No    Attends Banker Meetings: Never    Marital Status: Widowed     Review of Systems  Constitutional:  Negative for appetite change and unexpected weight change.  HENT:  Negative for congestion and sinus pressure.   Respiratory:  Negative for cough and chest tightness.        Breathing improved.   Cardiovascular:  Negative for chest pain, palpitations and leg swelling.  Gastrointestinal:  Negative for abdominal pain, diarrhea, nausea and vomiting.  Genitourinary:  Negative for difficulty urinating and  dysuria.  Musculoskeletal:  Negative for joint swelling and myalgias.  Skin:  Negative for color change and rash.  Neurological:  Negative for dizziness and headaches.  Psychiatric/Behavioral:  Negative for agitation and dysphoric mood.        Objective:     BP 110/60   Pulse 79   Temp 98.1 F (36.7 C) (Oral)   Ht 5' 5 (1.651  m)   Wt 174 lb 2 oz (79 kg)   SpO2 97%   BMI 28.98 kg/m  Wt Readings from Last 3 Encounters:  12/06/23 174 lb 2 oz (79 kg)  11/15/23 177 lb (80.3 kg)  11/07/23 170 lb (77.1 kg)    Physical Exam Vitals reviewed.  Constitutional:      General: She is not in acute distress.    Appearance: Normal appearance.  HENT:     Head: Normocephalic and atraumatic.     Right Ear: External ear normal.     Left Ear: External ear normal.     Mouth/Throat:     Pharynx: No oropharyngeal exudate or posterior oropharyngeal erythema.  Eyes:     General: No scleral icterus.       Right eye: No discharge.        Left eye: No discharge.     Conjunctiva/sclera: Conjunctivae normal.  Neck:     Thyroid : No thyromegaly.  Cardiovascular:     Rate and Rhythm: Normal rate and regular rhythm.  Pulmonary:     Effort: No respiratory distress.     Breath sounds: Normal breath sounds. No wheezing.  Abdominal:     General: Bowel sounds are normal.     Palpations: Abdomen is soft.     Tenderness: There is no abdominal tenderness.  Musculoskeletal:        General: No tenderness.     Cervical back: Neck supple. No tenderness.     Comments: No increased swelling.   Lymphadenopathy:     Cervical: No cervical adenopathy.  Skin:    Findings: No erythema or rash.  Neurological:     Mental Status: She is alert.  Psychiatric:        Mood and Affect: Mood normal.        Behavior: Behavior normal.         Outpatient Encounter Medications as of 12/06/2023  Medication Sig   dapagliflozin  propanediol (FARXIGA ) 10 MG TABS tablet TAKE 1 TABLET BY MOUTH DAILY BEFORE BREAKFAST.    docusate sodium  (COLACE) 100 MG capsule Take 1 capsule (100 mg total) by mouth 2 (two) times daily as needed for mild constipation.   hydrALAZINE  (APRESOLINE ) 50 MG tablet Take 1 tablet (50 mg total) by mouth 3 (three) times daily. (Patient taking differently: Take 25 mg by mouth daily.)   hydrochlorothiazide  (MICROZIDE ) 12.5 MG capsule Take 1 capsule (12.5 mg total) by mouth daily.   Magnesium  400 MG TABS Take by mouth.   Multiple Vitamin (MULTIVITAMIN) capsule Take 1 capsule by mouth daily.   rosuvastatin  (CRESTOR ) 5 MG tablet TAKE 1 TABLET (5 MG TOTAL) BY MOUTH DAILY.   traMADol  (ULTRAM ) 50 MG tablet Take 1 tablet (50 mg total) by mouth every 12 (twelve) hours as needed.   traZODone  (DESYREL ) 100 MG tablet TAKE 1 TABLET BY MOUTH AT BEDTIME AS NEEDED FOR SLEEP.   citalopram  (CELEXA ) 10 MG tablet Take 1 tablet (10 mg total) by mouth daily.   lisinopril  (ZESTRIL ) 40 MG tablet Take 1 tablet (40 mg total) by mouth daily.   [DISCONTINUED] Atogepant (QULIPTA) 60 MG TABS Take 60 mg by mouth. (Patient not taking: Reported on 12/06/2023)   [DISCONTINUED] Cholecalciferol (VITAMIN D3 PO) Take 2,000 Units by mouth daily. (Patient not taking: Reported on 12/06/2023)   [DISCONTINUED] citalopram  (CELEXA ) 10 MG tablet TAKE 1 TABLET BY MOUTH EVERY DAY   [DISCONTINUED] lisinopril  (ZESTRIL ) 40 MG tablet Take 1 tablet (40 mg total) by mouth daily.   [DISCONTINUED]  potassium chloride  (KLOR-CON  M) 10 MEQ tablet Take 1 tablet (10 mEq total) by mouth daily. (Patient not taking: Reported on 12/06/2023)   No facility-administered encounter medications on file as of 12/06/2023.     Lab Results  Component Value Date   WBC 6.4 11/07/2023   HGB 12.9 11/07/2023   HCT 39.9 11/07/2023   PLT 221 11/07/2023   GLUCOSE 110 (H) 12/06/2023   CHOL 129 12/06/2023   TRIG 85.0 12/06/2023   HDL 60.80 12/06/2023   LDLCALC 51 12/06/2023   ALT 16 12/06/2023   AST 17 12/06/2023   NA 138 12/06/2023   K 4.6 12/06/2023   CL 100  12/06/2023   CREATININE 1.26 (H) 12/06/2023   BUN 23 12/06/2023   CO2 30 12/06/2023   TSH 3.08 03/27/2023   HGBA1C 6.0 12/06/2023   MICROALBUR 4.0 (H) 08/11/2023    DG Elbow Complete Left Result Date: 11/09/2023 CLINICAL DATA:  Fall with bruising and pain EXAM: LEFT ELBOW - COMPLETE 3+ VIEW COMPARISON:  None Available. FINDINGS: There is no evidence of fracture, dislocation, or joint effusion. There is no evidence of arthropathy or other focal bone abnormality. Soft tissues are unremarkable. IMPRESSION: Negative. Electronically Signed   By: Luke Bun M.D.   On: 11/09/2023 17:19       Assessment & Plan:  Screening for osteoporosis  Encounter for screening mammogram for malignant neoplasm of breast -     3D Screening Mammogram, Left and Right; Future  Estrogen deficiency -     DG Bone Density; Future  Hyperlipidemia LDL goal <70 Assessment & Plan: Continue crestor . Low cholesterol diet and exercise.  Follow lipid panel and liver function tests.  No change in medication today.  Lab Results  Component Value Date   CHOL 129 12/06/2023   HDL 60.80 12/06/2023   LDLCALC 51 12/06/2023   TRIG 85.0 12/06/2023   CHOLHDL 2 12/06/2023     Orders: -     Hepatic function panel -     Lipid panel  Type 2 diabetes mellitus with hyperglycemia, without long-term current use of insulin (HCC) Assessment & Plan: Continue farxiga . Low carb diet and exercise. Follow met b and A1c.  No change in medication today.  Lab Results  Component Value Date   HGBA1C 6.0 12/06/2023     Orders: -     Basic metabolic panel with GFR -     Hemoglobin A1c  Primary hypertension Assessment & Plan:  I saw her 08/15/23 - and lisinopril /hydrochlorothiazide  was changed to lisinopril  20mg  q day due to decrease in GFR. With persistent elevation in blood pressure, lisinopril  was increased to 40mg  q day. Did not tolerate amlodipine . Persistent elevation recently. Was placed on hydralazine  50mg  tid now. While off  hydrochlorothiazide  blood pressures varied. Noticed increased swelling. Felt better on hydrochlorothiazide . Restarted hydrochlorothiazide  12.5mg  q day. Instructed to stay hydrated.  Comes in today for follow. Feeling better. No sob. No headache. Swelling better. Blood pressures better as outlined. Will continue on hydrochlorothiazide  12.5mg  q day. She has tapered hydralazine . Will stop. Follow pressures. Check metabolic panel today.    Nonintractable headache, unspecified chronicity pattern, unspecified headache type Assessment & Plan: Not an issue now. Neurology w/up as outlined.    Depression, major, single episode, mild Assessment & Plan: Continues on citalopram . Follow. Stable.    Coronary artery disease involving native coronary artery of native heart without angina pectoris Assessment & Plan: Followed by Dr End. CTA as outlined previously.  Continue farxiga  and crestor . Blood  pressure better. No increased sob currently. Follow.    Chronic heart failure with preserved ejection fraction (HFpEF) (HCC) Assessment & Plan: NYHA class II HFpEF. Recent echocardiogram showed LVEF of 55-60% with grade 2 diastolic dysfunction. Followed by Dr End.  Continue farxiga  and lisinopril . Continue hydrochlorothiazide .  Stop hydralazine . Follow pressures. Follow weight.    Anemia, unspecified type Assessment & Plan: Llow cbc.    Other orders -     Citalopram  Hydrobromide; Take 1 tablet (10 mg total) by mouth daily.  Dispense: 90 tablet; Refill: 1 -     Lisinopril ; Take 1 tablet (40 mg total) by mouth daily.  Dispense: 90 tablet; Refill: 1     Allena Hamilton, MD

## 2023-12-07 LAB — HEMOGLOBIN A1C: Hgb A1c MFr Bld: 6 % (ref 4.6–6.5)

## 2023-12-08 NOTE — Addendum Note (Signed)
 Addended by: Shine Scrogham on: 12/08/2023 09:01 AM   Modules accepted: Orders

## 2023-12-13 ENCOUNTER — Other Ambulatory Visit

## 2023-12-15 ENCOUNTER — Encounter: Payer: Self-pay | Admitting: Internal Medicine

## 2023-12-15 NOTE — Assessment & Plan Note (Signed)
 Continue crestor . Low cholesterol diet and exercise.  Follow lipid panel and liver function tests.  No change in medication today.  Lab Results  Component Value Date   CHOL 129 12/06/2023   HDL 60.80 12/06/2023   LDLCALC 51 12/06/2023   TRIG 85.0 12/06/2023   CHOLHDL 2 12/06/2023

## 2023-12-15 NOTE — Assessment & Plan Note (Signed)
 Llow cbc.

## 2023-12-15 NOTE — Assessment & Plan Note (Signed)
 I saw her 08/15/23 - and lisinopril /hydrochlorothiazide  was changed to lisinopril  20mg  q day due to decrease in GFR. With persistent elevation in blood pressure, lisinopril  was increased to 40mg  q day. Did not tolerate amlodipine . Persistent elevation recently. Was placed on hydralazine  50mg  tid now. While off hydrochlorothiazide  blood pressures varied. Noticed increased swelling. Felt better on hydrochlorothiazide . Restarted hydrochlorothiazide  12.5mg  q day. Instructed to stay hydrated.  Comes in today for follow. Feeling better. No sob. No headache. Swelling better. Blood pressures better as outlined. Will continue on hydrochlorothiazide  12.5mg  q day. She has tapered hydralazine . Will stop. Follow pressures. Check metabolic panel today.

## 2023-12-15 NOTE — Assessment & Plan Note (Signed)
 Continues on citalopram . Follow. Stable.

## 2023-12-15 NOTE — Assessment & Plan Note (Signed)
 Followed by Dr End. CTA as outlined previously.  Continue farxiga  and crestor . Blood pressure better. No increased sob currently. Follow.

## 2023-12-15 NOTE — Assessment & Plan Note (Signed)
 Continue farxiga . Low carb diet and exercise. Follow met b and A1c.  No change in medication today.  Lab Results  Component Value Date   HGBA1C 6.0 12/06/2023

## 2023-12-15 NOTE — Assessment & Plan Note (Signed)
 Not an issue now. Neurology w/up as outlined.

## 2023-12-15 NOTE — Assessment & Plan Note (Signed)
 NYHA class II HFpEF. Recent echocardiogram showed LVEF of 55-60% with grade 2 diastolic dysfunction. Followed by Dr End.  Continue farxiga  and lisinopril . Continue hydrochlorothiazide .  Stop hydralazine . Follow pressures. Follow weight.

## 2023-12-18 ENCOUNTER — Other Ambulatory Visit

## 2023-12-18 ENCOUNTER — Ambulatory Visit: Admitting: Internal Medicine

## 2023-12-18 DIAGNOSIS — E1165 Type 2 diabetes mellitus with hyperglycemia: Secondary | ICD-10-CM | POA: Diagnosis not present

## 2023-12-18 LAB — BASIC METABOLIC PANEL WITH GFR
BUN: 27 mg/dL — ABNORMAL HIGH (ref 6–23)
CO2: 28 meq/L (ref 19–32)
Calcium: 9.4 mg/dL (ref 8.4–10.5)
Chloride: 102 meq/L (ref 96–112)
Creatinine, Ser: 1.42 mg/dL — ABNORMAL HIGH (ref 0.40–1.20)
GFR: 36.2 mL/min — ABNORMAL LOW (ref >60.00)
Glucose, Bld: 109 mg/dL — ABNORMAL HIGH (ref 70–99)
Potassium: 3.9 meq/L (ref 3.5–5.1)
Sodium: 138 meq/L (ref 135–145)

## 2023-12-22 NOTE — Addendum Note (Signed)
 Addended by: Ghadeer Kastelic on: 12/22/2023 01:17 PM   Modules accepted: Orders

## 2024-01-02 ENCOUNTER — Other Ambulatory Visit

## 2024-01-02 DIAGNOSIS — E1165 Type 2 diabetes mellitus with hyperglycemia: Secondary | ICD-10-CM

## 2024-01-03 LAB — BASIC METABOLIC PANEL WITH GFR
BUN: 16 mg/dL (ref 6–23)
CO2: 30 meq/L (ref 19–32)
Calcium: 9.1 mg/dL (ref 8.4–10.5)
Chloride: 102 meq/L (ref 96–112)
Creatinine, Ser: 1.09 mg/dL (ref 0.40–1.20)
GFR: 49.7 mL/min — ABNORMAL LOW (ref >60.00)
Glucose, Bld: 121 mg/dL — ABNORMAL HIGH (ref 70–99)
Potassium: 4.1 meq/L (ref 3.5–5.1)
Sodium: 139 meq/L (ref 135–145)

## 2024-01-04 ENCOUNTER — Ambulatory Visit: Payer: Self-pay | Admitting: Internal Medicine

## 2024-01-05 ENCOUNTER — Encounter: Payer: Self-pay | Admitting: Internal Medicine

## 2024-01-05 NOTE — Telephone Encounter (Signed)
 Please schedule her for a follow up appt to discuss her blood pressure.

## 2024-01-09 ENCOUNTER — Ambulatory Visit
Admission: RE | Admit: 2024-01-09 | Discharge: 2024-01-09 | Disposition: A | Discharge status: Home / Self Care | Source: Ambulatory Visit | Attending: Internal Medicine | Admitting: Internal Medicine

## 2024-01-09 DIAGNOSIS — E2839 Other primary ovarian failure: Secondary | ICD-10-CM | POA: Insufficient documentation

## 2024-01-09 DIAGNOSIS — Z1231 Encounter for screening mammogram for malignant neoplasm of breast: Secondary | ICD-10-CM | POA: Insufficient documentation

## 2024-01-10 ENCOUNTER — Ambulatory Visit: Payer: Self-pay | Admitting: Internal Medicine

## 2024-01-23 ENCOUNTER — Telehealth: Payer: Self-pay | Admitting: Internal Medicine

## 2024-01-23 NOTE — Telephone Encounter (Signed)
 Lm and sent MyChart message:   Your appontment on1/14/2026 was rescheduled to @ 2:30 pm . If you are unable to make this appointment please call the office to reschedule your appointments

## 2024-01-31 ENCOUNTER — Ambulatory Visit: Admitting: Internal Medicine

## 2024-01-31 DIAGNOSIS — M1611 Unilateral primary osteoarthritis, right hip: Secondary | ICD-10-CM

## 2024-01-31 DIAGNOSIS — G8929 Other chronic pain: Secondary | ICD-10-CM

## 2024-01-31 DIAGNOSIS — E785 Hyperlipidemia, unspecified: Secondary | ICD-10-CM

## 2024-01-31 DIAGNOSIS — M1612 Unilateral primary osteoarthritis, left hip: Secondary | ICD-10-CM

## 2024-01-31 DIAGNOSIS — E1165 Type 2 diabetes mellitus with hyperglycemia: Secondary | ICD-10-CM

## 2024-01-31 DIAGNOSIS — R937 Abnormal findings on diagnostic imaging of other parts of musculoskeletal system: Secondary | ICD-10-CM

## 2024-01-31 DIAGNOSIS — M1712 Unilateral primary osteoarthritis, left knee: Secondary | ICD-10-CM

## 2024-01-31 DIAGNOSIS — R519 Headache, unspecified: Secondary | ICD-10-CM

## 2024-01-31 DIAGNOSIS — M47816 Spondylosis without myelopathy or radiculopathy, lumbar region: Secondary | ICD-10-CM

## 2024-01-31 DIAGNOSIS — M542 Cervicalgia: Secondary | ICD-10-CM

## 2024-02-05 ENCOUNTER — Other Ambulatory Visit: Payer: Self-pay | Admitting: Internal Medicine

## 2024-02-09 ENCOUNTER — Other Ambulatory Visit: Payer: Self-pay | Admitting: Internal Medicine

## 2024-02-09 ENCOUNTER — Ambulatory Visit: Admitting: Internal Medicine

## 2024-02-14 ENCOUNTER — Ambulatory Visit: Admitting: Internal Medicine

## 2024-02-16 ENCOUNTER — Ambulatory Visit: Admitting: Internal Medicine

## 2024-02-16 ENCOUNTER — Encounter: Payer: Self-pay | Admitting: Internal Medicine

## 2024-02-16 VITALS — BP 130/80 | HR 67 | Temp 98.0°F | Ht 65.0 in | Wt 173.4 lb

## 2024-02-16 DIAGNOSIS — F320 Major depressive disorder, single episode, mild: Secondary | ICD-10-CM

## 2024-02-16 DIAGNOSIS — I5032 Chronic diastolic (congestive) heart failure: Secondary | ICD-10-CM

## 2024-02-16 DIAGNOSIS — I10 Essential (primary) hypertension: Secondary | ICD-10-CM

## 2024-02-16 DIAGNOSIS — I2510 Atherosclerotic heart disease of native coronary artery without angina pectoris: Secondary | ICD-10-CM

## 2024-02-16 DIAGNOSIS — L409 Psoriasis, unspecified: Secondary | ICD-10-CM

## 2024-02-16 DIAGNOSIS — E559 Vitamin D deficiency, unspecified: Secondary | ICD-10-CM

## 2024-02-16 DIAGNOSIS — E1165 Type 2 diabetes mellitus with hyperglycemia: Secondary | ICD-10-CM

## 2024-02-16 DIAGNOSIS — E785 Hyperlipidemia, unspecified: Secondary | ICD-10-CM

## 2024-02-16 MED ORDER — CITALOPRAM HYDROBROMIDE 10 MG PO TABS: 10.0000 mg | ORAL_TABLET | Freq: Every day | ORAL | 1 refills | Status: AC

## 2024-02-16 MED ORDER — HYDROCHLOROTHIAZIDE 12.5 MG PO CAPS: ORAL_CAPSULE | ORAL | 2 refills | Status: AC

## 2024-02-16 MED ORDER — LISINOPRIL 40 MG PO TABS: 40.0000 mg | ORAL_TABLET | Freq: Every day | ORAL | 1 refills | Status: AC

## 2024-02-16 MED ORDER — TRAZODONE HCL 100 MG PO TABS: 100.0000 mg | ORAL_TABLET | Freq: Every evening | ORAL | 1 refills | Status: AC | PRN

## 2024-02-16 NOTE — Progress Notes (Unsigned)
 "  Subjective:    Patient ID: Tabitha Lewis, female    DOB: 10/29/1950, 74 y.o.   MRN: [id number]  Patient here for  Chief Complaint  Patient presents with   Medical Management of Chronic Issues    HPI Here for a scheduled follow up - follow up regarding her blood pressure. Continues on farxiga . Also taking hydrochlorothiazide . She is taking every third day. Doing well on this dose. No swelling. Blood pressure doing well. Breathing stable. No increased cough or congestion. No abdominal pain or bowel change reported. Saw Dr Dela - diagnosed with psoriasis. Started methotrexate 01/31/24. Planning f/u labs through dermatology.    Past Medical History:  Diagnosis Date   AKI (acute kidney injury)    03/2020-04/2020   Allergy    i.e contact allergies [place] [facility] Douglass Mulligan; also plants, trees    Chicken pox    Depression    Diabetes mellitus without complication (HCC)    DIET CONTROLLED   Family history of breast cancer    BRCA negative in the past   Family history of breast cancer    Family history of lung cancer    Family history of rectal cancer    History of prediabetes    Hypertension    Interstitial cystitis    Interstitial cystitis    Spinal stenosis    UTI (urinary tract infection)    UTI (urinary tract infection)    Past Surgical History:  Procedure Laterality Date   CATARACT EXTRACTION Bilateral    b/l Dr. 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   Procedure: TOTAL HIP ARTHROPLASTY ANTERIOR APPROACH;  Surgeon: Kathlynn Sharper, MD;  Location: [facility] ORS;  Service: Orthopedics;  Laterality: Right;   Family History  Problem Relation Age of Onset   Heart attack Mother 20   Breast cancer Mother 28       dx 25, 82   COPD Mother    Heart failure Mother    Leukemia Mother    Stroke Father        age 40    Breast cancer Sister 43   Other Daughter        benign brain tumor   Polycystic ovary syndrome Daughter    Breast cancer Maternal Aunt         dx >50   Lung cancer Maternal Uncle    Breast cancer Maternal Grandmother    Breast cancer Cousin        mat cousins x4   Stroke Other        grandmother age 64    Lupus Other    Social History   Socioeconomic History   Marital status: Widowed    Spouse name: Not on file   Number of children: 2   Years of education: Not on file   Highest education level: Not on file  Occupational History   Not on file  Tobacco Use   Smoking status: Former    Current packs/day: 0.00    Average packs/day: 1 pack/day for 7.0 years (7.0 ttl pk-yrs)    Types: Cigarettes    Start date: 26    Quit date: 55    Years since quitting: 53.1   Smokeless tobacco: Never  Vaping Use   Vaping status: Never Used  Substance and Sexual Activity   Alcohol use: No   Drug use: No   Sexual activity: Not on file  Other Topics Concern   Not on file  Social History Narrative  1 daughter and 1 son    3 grandsons    Married to high school sweet heart (husband) x 47 years dx'ed with prostate cancer in March 03, 2015 he died 2017-05-30    Retired Optometrist education [employer]   Social Drivers of Health   Tobacco Use: Medium Risk (02/18/2024)   Patient History    Smoking Tobacco Use: Former    Smokeless Tobacco Use: Never    Passive Exposure: Not on Actuary Strain: Low Risk  (10/05/2023)   Received from [facility]   Overall Financial Resource Strain (CARDIA)    Difficulty of Paying Living Expenses: Not hard at all  Food Insecurity: No Food Insecurity (10/05/2023)   Received from [facility]   Epic    Within the past 12 months, you worried that your food would run out before you got the money to buy more.: Never true    Within the past 12 months, the food you bought just didn't last and you didn't have money to get more.: Never true  Transportation Needs: No Transportation Needs (10/05/2023)   Received from [facility] -  Transportation    In the past 12 months, has lack of transportation kept you from medical appointments or from getting medications?: No    Lack of Transportation (Non-Medical): No  Physical Activity: Sufficiently Active (10/19/2022)   Exercise Vital Sign    Days of Exercise per Week: 3 days    Minutes of Exercise per Session: 60 min  Stress: No Stress Concern Present (10/19/2022)   [employer] - Occupational Stress Questionnaire    Feeling of Stress : Only a little  Social Connections: Socially Isolated (10/19/2022)   Social Connection and Isolation Panel    Frequency of Communication with Friends and Family: More than three times a week    Frequency of Social Gatherings with Friends and Family: Twice a week    Attends Religious Services: Never    Database Administrator or Organizations: No    Attends Banker Meetings: Never    Marital Status: Widowed  Depression (PHQ2-9): Low Risk (12/06/2023)   Depression (PHQ2-9)    PHQ-2 Score: 0  Alcohol Screen: Low Risk (10/19/2022)   Alcohol Screen    Last Alcohol Screening Score (AUDIT): 0  Housing: Low Risk  (10/05/2023)   Received from [facility]   Epic    In the last 12 months, was there a time when you were not able to pay the mortgage or rent on time?: No    In the past 12 months, how many times have you moved where you were living?: 0    At any time in the past 12 months, were you homeless or living in a shelter (including now)?: No  Utilities: Not At Risk (10/05/2023)   Received from [facility]   Epic    In the past 12 months has the electric, gas, oil, or water company threatened to shut off services in your home?: No  Health Literacy: Adequate Health Literacy (10/19/2022)   B1300 Health Literacy    Frequency of need for help with medical instructions: Never     Review of Systems  Constitutional:  Negative for appetite change and unexpected weight change.   HENT:  Negative for congestion and sinus pressure.   Respiratory:  Negative for cough and chest tightness.  Breathing stable.   Cardiovascular:  Negative for chest pain and palpitations.       No increased swelling.   Gastrointestinal:  Negative for abdominal pain, diarrhea, nausea and vomiting.  Genitourinary:  Negative for difficulty urinating and dysuria.  Musculoskeletal:  Negative for joint swelling and myalgias.  Skin:  Negative for color change and rash.  Neurological:  Negative for dizziness and headaches.  Psychiatric/Behavioral:  Negative for agitation and dysphoric mood.        Objective:     BP 130/80   Pulse 67   Temp 98 F (36.7 C) (Oral)   Ht 5' 5 (1.651 m)   Wt 173 lb 6.4 oz (78.7 kg)   SpO2 97%   BMI 28.86 kg/m  Wt Readings from Last 3 Encounters:  02/16/24 173 lb 6.4 oz (78.7 kg)  12/06/23 174 lb 2 oz (79 kg)  11/15/23 177 lb (80.3 kg)    Physical Exam Vitals reviewed.  Constitutional:      General: She is not in acute distress.    Appearance: Normal appearance.  HENT:     Head: Normocephalic and atraumatic.     Right Ear: External ear normal.     Left Ear: External ear normal.  Eyes:     General: No scleral icterus.       Right eye: No discharge.        Left eye: No discharge.     Conjunctiva/sclera: Conjunctivae normal.  Neck:     Thyroid : No thyromegaly.  Cardiovascular:     Rate and Rhythm: Normal rate and regular rhythm.  Pulmonary:     Effort: No respiratory distress.     Breath sounds: Normal breath sounds. No wheezing.  Abdominal:     General: Bowel sounds are normal.     Palpations: Abdomen is soft.     Tenderness: There is no abdominal tenderness.  Musculoskeletal:        General: No swelling or tenderness.     Cervical back: Neck supple. No tenderness.  Lymphadenopathy:     Cervical: No cervical adenopathy.  Skin:    Findings: No erythema or rash.  Neurological:     Mental Status: She is alert.  Psychiatric:         Mood and Affect: Mood normal.        Behavior: Behavior normal.         Outpatient Encounter Medications as of 02/16/2024  Medication Sig   dapagliflozin  propanediol (FARXIGA ) 10 MG TABS tablet TAKE 1 TABLET BY MOUTH DAILY BEFORE BREAKFAST.   docusate sodium  (COLACE) 100 MG capsule Take 1 capsule (100 mg total) by mouth 2 (two) times daily as needed for mild constipation.   folic acid (FOLVITE) 1 MG tablet Take 1 mg by mouth daily.   Magnesium  400 MG TABS Take by mouth.   methotrexate (RHEUMATREX) 2.5 MG tablet Take 10 mg by mouth once a week.   Multiple Vitamin (MULTIVITAMIN) capsule Take 1 capsule by mouth daily.   rosuvastatin  (CRESTOR ) 5 MG tablet TAKE 1 TABLET (5 MG TOTAL) BY MOUTH DAILY.   traMADol  (ULTRAM ) 50 MG tablet Take 1 tablet (50 mg total) by mouth every 12 (twelve) hours as needed.   citalopram  (CELEXA ) 10 MG tablet Take 1 tablet (10 mg total) by mouth daily.   hydrochlorothiazide  (MICROZIDE ) 12.5 MG capsule Take one capsule 2 days per week.   lisinopril  (ZESTRIL ) 40 MG tablet Take 1 tablet (40 mg total) by mouth daily.   traZODone  (DESYREL ) 100  MG tablet Take 1 tablet (100 mg total) by mouth at bedtime as needed. for sleep   [DISCONTINUED] citalopram  (CELEXA ) 10 MG tablet Take 1 tablet (10 mg total) by mouth daily.   [DISCONTINUED] hydrALAZINE  (APRESOLINE ) 50 MG tablet Take 1 tablet (50 mg total) by mouth 3 (three) times daily. (Patient taking differently: Take 25 mg by mouth daily.)   [DISCONTINUED] hydrochlorothiazide  (MICROZIDE ) 12.5 MG capsule TAKE 1 CAPSULE BY MOUTH EVERY DAY   [DISCONTINUED] lisinopril  (ZESTRIL ) 40 MG tablet Take 1 tablet (40 mg total) by mouth daily.   [DISCONTINUED] traZODone  (DESYREL ) 100 MG tablet TAKE 1 TABLET BY MOUTH AT BEDTIME AS NEEDED FOR SLEEP.   No facility-administered encounter medications on file as of 02/16/2024.     Lab Results  Component Value Date   WBC 6.4 11/07/2023   HGB 12.9 11/07/2023   HCT 39.9 11/07/2023   PLT 221  11/07/2023   GLUCOSE 121 (H) 01/02/2024   CHOL 129 12/06/2023   TRIG 85.0 12/06/2023   HDL 60.80 12/06/2023   LDLCALC 51 12/06/2023   ALT 16 12/06/2023   AST 17 12/06/2023   NA 139 01/02/2024   K 4.1 01/02/2024   CL 102 01/02/2024   CREATININE 1.09 01/02/2024   BUN 16 01/02/2024   CO2 30 01/02/2024   TSH 3.08 03/27/2023   HGBA1C 6.0 12/06/2023   MICROALBUR 4.0 (H) 08/11/2023    MM 3D SCREENING MAMMOGRAM BILATERAL BREAST Result Date: 01/17/2024 CLINICAL DATA:  Screening. EXAM: DIGITAL SCREENING BILATERAL MAMMOGRAM WITH TOMOSYNTHESIS AND CAD TECHNIQUE: Bilateral screening digital craniocaudal and mediolateral oblique mammograms were obtained. Bilateral screening digital breast tomosynthesis was performed. The images were evaluated with computer-aided detection. COMPARISON:  Previous exam(s). ACR Breast Density Category c: The breasts are heterogeneously dense, which may obscure small masses. FINDINGS: There are no findings suspicious for malignancy. IMPRESSION: No mammographic evidence of malignancy. A result letter of this screening mammogram will be mailed directly to the patient. RECOMMENDATION: Screening mammogram in one year. (Code:[id number]-B-01Y) BI-RADS CATEGORY  1: Negative. Electronically Signed   By: Norleen Croak M.D.   On: 01/17/2024 08:40   DG Bone Density Result Date: 01/09/2024 EXAM: DUAL X-RAY ABSORPTIOMETRY (DXA) FOR BONE MINERAL DENSITY 01/09/2024 2:10 pm CLINICAL DATA:  74 year old Female Postmenopausal. Estrogen deficiency TECHNIQUE: An axial (e.g., hips, spine) and/or appendicular (e.g., radius) exam was performed, as appropriate, using GE Secretary/administrator at [facility]. Images are obtained for bone mineral density measurement and are not obtained for diagnostic purposes. [id number] Exclusions: Right hip due to surgical hardware. COMPARISON:  11/15/2021. FINDINGS: Scan quality: Good. LUMBAR SPINE (L1-L4): BMD (in g/cm2): 1.276 T-score: 0.7 Z-score: 2.4  LEFT FEMORAL NECK: BMD (in g/cm2): 0.798 T-score: -1.7 Z-score: 0.2 LEFT TOTAL HIP: BMD (in g/cm2): 0.866 T-score: -1.1 Z-score: 0.6 Rate of change from previous exam: -3.6 % LEFT FOREARM (RADIUS 33%): BMD (in g/cm2): 0.850 T-score: -0.3 Z-score: 2.0 Rate of change from previous exam: No significant rate of change from previous exam. FRAX 10-YEAR PROBABILITY OF FRACTURE: 10-year fracture risk is performed using the [facility] calculator based on patient-reported risk factors. Major osteoporotic fracture: 12.0% Hip fracture: 2.7% Other situations known to alter the reliability of the FRAX score should be considered when making treatment decisions, including chronic glucocorticoid use and past treatments. Further guidance on treatment can be found at the [facility]'s website https://www.patton.com/. IMPRESSION: Osteopenia based on BMD. Fracture risk is increased. Increased risk is based on low BMD. RECOMMENDATIONS: 1. All  patients should optimize calcium  and vitamin D  intake. 2. Consider FDA-approved medical therapies in postmenopausal women and men aged 68 years and older, based on the following: - A hip or vertebral (clinical or morphometric) fracture - T-score less than or equal to -2.5 and secondary causes have been excluded. - Low bone mass (T-score between -1.0 and -2.5) and a 10-year probability of a hip fracture greater than or equal to 3% or a 10-year probability of a major osteoporosis-related fracture greater than or equal to 20% based on the [place] -adapted WHO algorithm. - Clinician judgment and/or patient preferences may indicate treatment for people with 10-year fracture probabilities above or below these levels 3. Patients with diagnosis of osteoporosis or at high risk for fracture should have regular bone mineral density tests. For patients eligible for Medicare, routine testing is allowed once every 2 years. The testing frequency can be increased to one year for patients who  have rapidly progressing disease, those who are receiving or discontinuing medical therapy to restore bone mass, or have additional risk factors. Electronically Signed   By: Harrietta Sherry M.D.   On: 01/09/2024 14:31       Assessment & Plan:  Vitamin D  deficiency Assessment & Plan: Vitamin D  level wnl - 07/2023.    Type 2 diabetes mellitus with hyperglycemia, without long-term current use of insulin (HCC) Assessment & Plan: Continue farxiga . Low carb diet and exercise. Follow met b and A1c.  Lab Results  Component Value Date   HGBA1C 6.0 12/06/2023     Orders: -     Hemoglobin A1c; Future -     Basic metabolic panel with GFR; Future -     Microalbumin / creatinine urine ratio; Future  Hyperlipidemia LDL goal <70 Assessment & Plan: Continue crestor . Low cholesterol diet and exercise.  Follow lipid panel.  Lab Results  Component Value Date   CHOL 129 12/06/2023   HDL 60.80 12/06/2023   LDLCALC 51 12/06/2023   TRIG 85.0 12/06/2023   CHOLHDL 2 12/06/2023     Orders: -     Hepatic function panel; Future -     Lipid panel; Future -     CBC with Differential/Platelet; Future  Primary hypertension Assessment & Plan: Continues on farxiga  and lisinopril  40mg  q day. Taking hydrochlorothiazide  every third day. Discussed schedule hydrochlorothiazide . Blood pressure doing well. Follow metabolic panel.    Depression, major, single episode, mild Assessment & Plan: Continues on citalopram . Overall appears to be doing well. Follow.    Coronary artery disease involving native coronary artery of native heart without angina pectoris Assessment & Plan: Followed by Dr End. CTA as outlined previously.  Continue farxiga  and crestor . Blood pressure better. No increased sob currently. Follow.    Chronic heart failure with preserved ejection fraction (HFpEF) (HCC) Assessment & Plan: NYHA class II HFpEF. Recent echocardiogram showed LVEF of 55-60% with grade 2 diastolic dysfunction.  Followed by Dr End.  Continue farxiga  and lisinopril . Continue hydrochlorothiazide  2x/week. Remain off hydralazine . Follow pressures.    Psoriasis Assessment & Plan: Followed by Dr Dela. On methotrexate. Labs scheduled through dermatology.    Other orders -     Citalopram  Hydrobromide; Take 1 tablet (10 mg total) by mouth daily.  Dispense: 90 tablet; Refill: 1 -     hydroCHLOROthiazide ; Take one capsule 2 days per week.  Dispense: 30 capsule; Refill: 2 -     Lisinopril ; Take 1 tablet (40 mg total) by mouth daily.  Dispense: 90 tablet; Refill: 1 -  traZODone  HCl; Take 1 tablet (100 mg total) by mouth at bedtime as needed. for sleep  Dispense: 90 tablet; Refill: 1     Allena Hamilton, MD "

## 2024-02-18 ENCOUNTER — Encounter: Payer: Self-pay | Admitting: Internal Medicine

## 2024-02-18 DIAGNOSIS — L409 Psoriasis, unspecified: Secondary | ICD-10-CM | POA: Insufficient documentation

## 2024-02-18 NOTE — Assessment & Plan Note (Signed)
 Followed by Dr Dela. On methotrexate. Labs scheduled through dermatology.

## 2024-02-18 NOTE — Assessment & Plan Note (Signed)
 Continue crestor . Low cholesterol diet and exercise.  Follow lipid panel.  Lab Results  Component Value Date   CHOL 129 12/06/2023   HDL 60.80 12/06/2023   LDLCALC 51 12/06/2023   TRIG 85.0 12/06/2023   CHOLHDL 2 12/06/2023

## 2024-02-18 NOTE — Assessment & Plan Note (Signed)
 Continue farxiga . Low carb diet and exercise. Follow met b and A1c.  Lab Results  Component Value Date   HGBA1C 6.0 12/06/2023

## 2024-02-18 NOTE — Assessment & Plan Note (Signed)
 NYHA class II HFpEF. Recent echocardiogram showed LVEF of 55-60% with grade 2 diastolic dysfunction. Followed by Dr End.  Continue farxiga  and lisinopril . Continue hydrochlorothiazide  2x/week. Remain off hydralazine . Follow pressures.

## 2024-02-18 NOTE — Assessment & Plan Note (Signed)
 Followed by Dr End. CTA as outlined previously.  Continue farxiga  and crestor . Blood pressure better. No increased sob currently. Follow.

## 2024-02-18 NOTE — Assessment & Plan Note (Signed)
 Vitamin D  level wnl 07/2023.

## 2024-02-18 NOTE — Assessment & Plan Note (Addendum)
 Continues on farxiga  and lisinopril  40mg  q day. Taking hydrochlorothiazide  every third day. Discussed schedule hydrochlorothiazide . Blood pressure doing well. Follow metabolic panel.

## 2024-02-18 NOTE — Assessment & Plan Note (Signed)
 Continues on citalopram . Overall appears to be doing well. Follow.

## 2024-06-20 ENCOUNTER — Ambulatory Visit: Admitting: Internal Medicine
